# Patient Record
Sex: Female | Born: 1990 | Race: White | Hispanic: No | Smoking: Never smoker
Health system: Southern US, Community
[De-identification: ages and names within clinical notes are randomized; demographics above are authoritative.]

## PROBLEM LIST (undated history)

## (undated) DIAGNOSIS — T753XXA Motion sickness, initial encounter: Secondary | ICD-10-CM

## (undated) DIAGNOSIS — N83209 Unspecified ovarian cyst, unspecified side: Secondary | ICD-10-CM

## (undated) DIAGNOSIS — N946 Dysmenorrhea, unspecified: Secondary | ICD-10-CM

## (undated) DIAGNOSIS — F419 Anxiety disorder, unspecified: Secondary | ICD-10-CM

## (undated) DIAGNOSIS — F32A Depression, unspecified: Secondary | ICD-10-CM

## (undated) DIAGNOSIS — N63 Unspecified lump in unspecified breast: Secondary | ICD-10-CM

## (undated) DIAGNOSIS — F329 Major depressive disorder, single episode, unspecified: Secondary | ICD-10-CM

## (undated) DIAGNOSIS — O43129 Velamentous insertion of umbilical cord, unspecified trimester: Secondary | ICD-10-CM

## (undated) HISTORY — DX: Velamentous insertion of umbilical cord, unspecified trimester: O43.129

## (undated) HISTORY — DX: Unspecified ovarian cyst, unspecified side: N83.209

## (undated) HISTORY — DX: Unspecified lump in unspecified breast: N63.0

## (undated) HISTORY — DX: Anxiety disorder, unspecified: F41.9

## (undated) HISTORY — DX: Dysmenorrhea, unspecified: N94.6

## (undated) HISTORY — DX: Depression, unspecified: F32.A

---
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
## 2010-01-01 ENCOUNTER — Emergency Department: Payer: Self-pay | Admitting: Emergency Medicine

## 2010-05-02 DIAGNOSIS — N63 Unspecified lump in unspecified breast: Secondary | ICD-10-CM

## 2010-05-02 HISTORY — DX: Unspecified lump in unspecified breast: N63.0

## 2011-09-26 ENCOUNTER — Ambulatory Visit: Payer: Self-pay | Admitting: Family Medicine

## 2013-10-06 ENCOUNTER — Emergency Department: Payer: Self-pay | Admitting: Emergency Medicine

## 2013-10-06 LAB — CBC WITH DIFFERENTIAL/PLATELET
BASOS PCT: 0.2 %
Basophil #: 0 10*3/uL (ref 0.0–0.1)
EOS ABS: 0 10*3/uL (ref 0.0–0.7)
Eosinophil %: 0.4 %
HCT: 37.9 % (ref 35.0–47.0)
HGB: 13.1 g/dL (ref 12.0–16.0)
LYMPHS PCT: 17.7 %
Lymphocyte #: 1.9 10*3/uL (ref 1.0–3.6)
MCH: 32.3 pg (ref 26.0–34.0)
MCHC: 34.6 g/dL (ref 32.0–36.0)
MCV: 93 fL (ref 80–100)
Monocyte #: 0.7 x10 3/mm (ref 0.2–0.9)
Monocyte %: 7 %
NEUTROS PCT: 74.7 %
Neutrophil #: 7.9 10*3/uL — ABNORMAL HIGH (ref 1.4–6.5)
Platelet: 263 10*3/uL (ref 150–440)
RBC: 4.06 10*6/uL (ref 3.80–5.20)
RDW: 12.6 % (ref 11.5–14.5)
WBC: 10.6 10*3/uL (ref 3.6–11.0)

## 2013-10-06 LAB — HCG, QUANTITATIVE, PREGNANCY: Beta Hcg, Quant.: 14947 m[IU]/mL — ABNORMAL HIGH

## 2013-10-06 LAB — BASIC METABOLIC PANEL
ANION GAP: 7 (ref 7–16)
BUN: 9 mg/dL (ref 7–18)
CHLORIDE: 108 mmol/L — AB (ref 98–107)
CO2: 22 mmol/L (ref 21–32)
CREATININE: 0.66 mg/dL (ref 0.60–1.30)
Calcium, Total: 8.9 mg/dL (ref 8.5–10.1)
EGFR (African American): >60
EGFR (Non-African Amer.): >60
GLUCOSE: 56 mg/dL — AB (ref 65–99)
Osmolality: 270 (ref 275–301)
Potassium: 3.8 mmol/L (ref 3.5–5.1)
Sodium: 137 mmol/L (ref 136–145)

## 2013-10-06 LAB — URINALYSIS, COMPLETE
BACTERIA: NONE SEEN
Bilirubin,UR: NEGATIVE
GLUCOSE, UR: NEGATIVE mg/dL (ref 0–75)
Leukocyte Esterase: NEGATIVE
NITRITE: NEGATIVE
PH: 9 (ref 4.5–8.0)
Protein: NEGATIVE
RBC,UR: 5 /HPF (ref 0–5)
Specific Gravity: 1.003 (ref 1.003–1.030)
Squamous Epithelial: 3
WBC UR: 1 /HPF (ref 0–5)

## 2013-10-06 LAB — GC/CHLAMYDIA PROBE AMP

## 2013-10-06 LAB — WET PREP, GENITAL

## 2013-11-30 ENCOUNTER — Encounter: Payer: Self-pay | Admitting: Maternal & Fetal Medicine

## 2014-03-23 ENCOUNTER — Inpatient Hospital Stay: Payer: Self-pay | Admitting: Obstetrics and Gynecology

## 2014-03-23 LAB — CBC WITH DIFFERENTIAL/PLATELET
Basophil #: 0 10*3/uL (ref 0.0–0.1)
Basophil %: 0.2 %
Eosinophil #: 0 10*3/uL (ref 0.0–0.7)
Eosinophil %: 0.1 %
HCT: 37.9 % (ref 35.0–47.0)
HGB: 12.6 g/dL (ref 12.0–16.0)
Lymphocyte #: 1.8 10*3/uL (ref 1.0–3.6)
Lymphocyte %: 10.6 %
MCH: 31.1 pg (ref 26.0–34.0)
MCHC: 33.2 g/dL (ref 32.0–36.0)
MCV: 94 fL (ref 80–100)
Monocyte #: 0.8 x10 3/mm (ref 0.2–0.9)
Monocyte %: 4.8 %
Neutrophil #: 14 10*3/uL — ABNORMAL HIGH (ref 1.4–6.5)
Neutrophil %: 84.3 %
Platelet: 336 10*3/uL (ref 150–440)
RBC: 4.05 10*6/uL (ref 3.80–5.20)
RDW: 13.5 % (ref 11.5–14.5)
WBC: 16.6 10*3/uL — ABNORMAL HIGH (ref 3.6–11.0)

## 2014-03-23 LAB — GC/CHLAMYDIA PROBE AMP

## 2014-03-24 DIAGNOSIS — O43129 Velamentous insertion of umbilical cord, unspecified trimester: Secondary | ICD-10-CM

## 2014-03-25 LAB — HEMATOCRIT: HCT: 28.9 % — AB (ref 35.0–47.0)

## 2014-11-09 NOTE — H&P (Signed)
L&D Evaluation:  History Expanded:  HPI 24 yo G1 with EDD of 03/25/14 per 6 wk [place], presents at [redacted]w[redacted]d with c/o contractions and mucous-like discharge. Denies VB or decreased FM. PNC at [facility], early entry to care, Velamentous cord insertion -growth [place] EFW at 27 wks: 67%, at 31 wks: 42%. TDAP given 01/26/14. In[employer]aseq negative ([employer] employee).   Blood Type (Maternal) O positive   Group B Strep Results Maternal (Result >5wks must be treated as unknown) + urine culture, negative vaginal/rectal culture   Maternal HIV Negative   Maternal Syphilis Ab Nonreactive   Maternal Varicella Immune   Rubella Results (Maternal) immune   Patient's Medical History Anxiety - no meds in pregnancy   Patient's Surgical History none   Medications Pre Natal Vitamins   Allergies Amoxicillin   Social History none   Exam:  Vital Signs stable   General no apparent distress   Mental Status clear   Chest no increased work of breathing   Abdomen gravid, tender with contractions   Estimated Fetal Weight Average for gestational age   Pelvic 3/90/-1, changed from 1 cm on admission   Mebranes Intact, nitrizine negative   FHT normal rate with no decels, category 1 tracing   Ucx regular   Impression:  Impression active labor   Plan:  Plan antibiotics for GBBS prophylaxis   Comments Admission for labor - declines pain medication at this time.   EFW at 31 wks: 42%, 38 lb weight gain during pregnancy   Electronic Signatures: Midas Daughety, Rulon Sera (CNM)  (Signed 22-Sep-15 12:46)  Authored: L&D Evaluation   Last Updated: 22-Sep-15 12:46 by Ander Purpura (CNM)

## 2015-05-02 ENCOUNTER — Encounter: Payer: Self-pay | Admitting: Family Medicine

## 2015-05-02 ENCOUNTER — Other Ambulatory Visit (INDEPENDENT_AMBULATORY_CARE_PROVIDER_SITE_OTHER): Payer: 59

## 2015-05-02 ENCOUNTER — Ambulatory Visit (INDEPENDENT_AMBULATORY_CARE_PROVIDER_SITE_OTHER): Payer: 59 | Admitting: Family Medicine

## 2015-05-02 ENCOUNTER — Ambulatory Visit (INDEPENDENT_AMBULATORY_CARE_PROVIDER_SITE_OTHER)
Admission: RE | Admit: 2015-05-02 | Discharge: 2015-05-02 | Disposition: A | Discharge status: Home / Self Care | Payer: 59 | Source: Ambulatory Visit | Attending: Family Medicine | Admitting: Family Medicine

## 2015-05-02 VITALS — BP 120/82 | HR 70 | Ht 67.0 in | Wt 141.0 lb

## 2015-05-02 DIAGNOSIS — S76301A Unspecified injury of muscle, fascia and tendon of the posterior muscle group at thigh level, right thigh, initial encounter: Secondary | ICD-10-CM

## 2015-05-02 DIAGNOSIS — S76311A Strain of muscle, fascia and tendon of the posterior muscle group at thigh level, right thigh, initial encounter: Secondary | ICD-10-CM

## 2015-05-02 DIAGNOSIS — S76319A Strain of muscle, fascia and tendon of the posterior muscle group at thigh level, unspecified thigh, initial encounter: Secondary | ICD-10-CM | POA: Insufficient documentation

## 2015-05-02 MED ORDER — MELOXICAM 15 MG PO TABS: 15.0000 mg | ORAL_TABLET | Freq: Every day | ORAL | Status: DC

## 2015-05-02 NOTE — Progress Notes (Signed)
Pre visit review using our clinic review tool, if applicable. No additional management support is needed unless otherwise documented below in the visit note. 

## 2015-05-02 NOTE — Progress Notes (Signed)
[facility] [place], [address] [address] Phone: [phone] Subjective:    I'm seeing this patient by the request  of:  Lykins, MD  CC: right leg pain  DJS:[id number] Gabrielle Jackson is a 24 y.o. female coming in with complaint of right thigh pain.patient was seen 10 days ago in an urgent care facility and was diagnosed with a potential for a hamstring strain. Patient was to take prednisone, icing, and some mild exercises. Patient did not do any of these. Continues to have pain. Going on 2 weeks now. Does not remember any true injury. Rates the severity of pain as 5 out of 10. Seems to be more of a tightness. Unable to work out on a regular basis. Not affecting daily activities though. States that it feels tight at night but does not wake her up at night.denies any associated back pain.    No past medical history on file.  No past surgical history on file. Social History  Substance Use Topics  . Smoking status: Never Smoker   . Smokeless tobacco: None  . Alcohol Use: None   Not on File No family history on file.  Past medical history, social, surgical and family history all reviewed in electronic medical record.   Review of Systems: No headache, visual changes, nausea, vomiting, diarrhea, constipation, dizziness, abdominal pain, skin rash, fevers, chills, night sweats, weight loss, swollen lymph nodes, body aches, joint swelling, muscle aches, chest pain, shortness of breath, mood changes.   Objective Blood pressure 120/82, pulse 70, height 5\' 7"  (1.702 m), weight 141 lb (63.957 kg), SpO2 99 %.  General: No apparent distress alert and oriented x3 mood and affect normal, dressed appropriately.  HEENT: Pupils equal, extraocular movements intact  Respiratory: Patient's speak in full sentences and does not appear short of breath  Cardiovascular: No lower extremity edema, non tender, no erythema  Skin: Warm dry intact with no signs of  infection or rash on extremities or on axial skeleton.  Abdomen: Soft nontender  Neuro: Cranial nerves II through XII are intact, neurovascularly intact in all extremities with 2+ DTRs and 2+ pulses.  Lymph: No lymphadenopathy of posterior or anterior cervical chain or axillae bilaterally.  Gaitmild antalgic gait  MSK:  Non tender with full range of motion and good stability and symmetric strength and tone of shoulders, elbows, wrist, hip, and ankles bilaterally.  Knee:right Normal to inspection with no erythema or effusion or obvious bony abnormalities. Tender to palpation over the hamstring distal third. Significant tightness compared to the contralateral side.4-5 strength compared to the contralateral side. No pain over the ischial and mild pain over the lateral aspect of the knee. ROM full in flexion and extension and lower leg rotation. Ligaments with solid consistent endpoints including ACL, PCL, LCL, MCL. Negative Mcmurray's, Apley's, and Thessalonian tests. Non painful patellar compression. Patellar glide without crepitus. Patellar and quadriceps tendons unremarkable. Contralateral knee unremarkable  MSK US performed of: right leg This study was ordered, performed, and interpreted by Charlann Boxer D.O.  Knee: All structures visualized. Anteromedial, anterolateral, posteromedial, and posterolateral menisci unremarkable without tearing, fraying, effusion, or displacement. Patient distal third of the hamstrings does have what seems to be a tear mostly of the semi-tendinosis muscle. Hypoechoic changes still noted and increasing Doppler flow. Patient does have scar tissue formation noted. Seems to be healing appropriately. Patellar Tendon unremarkable on long and transverse views without effusion. No abnormality of prepatellar bursa. LCL and MCL unremarkable on  long and transverse views. No abnormality of origin of medial or lateral head of the gastrocnemius.  IMPRESSION:  Hamstring tear  with healing interval.  Procedure note [id number]; 15 minutes spent for Therapeutic exercises as stated in above notes.  This included exercises focusing on stretching, strengthening, with significant focus on eccentric aspects.  .pPatient given eccentric muscle exercises working on range of motion as well as some strengthening. Proper technique shown and discussed handout in great detail with ATC.  All questions were discussed and answered.      Impression and Recommendations:     This case required medical decision making of moderate complexity.

## 2015-05-02 NOTE — Assessment & Plan Note (Signed)
Patient will do more of a compression sleeve, work with Product/process development scientist today. We discussed icing and oral anti-inflammatories. Patient will try these interventions and come back and see me again in 2-3 weeks. X-rays ordered today to rule out any bony abnormality or avulsion fracture. Patient's continuing to have trouble and is imaging would be warranted but I think she will do very well.

## 2015-05-02 NOTE — Patient Instructions (Signed)
Meloxicam daily for 10 days then as needed pennsaid pinkie amount topically 2 times daily as needed.  Ice 20 minutes 2 times daily. Usually after activity and before bed. Compression sleeve to the thigh Exercises 3 times a week.  Avoid any jumping, running or high impact exercises for now See me again in 2-3 weeks.

## 2015-05-04 ENCOUNTER — Telehealth: Payer: Self-pay | Admitting: Family Medicine

## 2015-05-04 NOTE — Telephone Encounter (Signed)
Discussed results with pt.

## 2015-05-04 NOTE — Telephone Encounter (Signed)
Left message to call back  

## 2015-05-04 NOTE — Telephone Encounter (Signed)
Is requesting call back with x ray results

## 2015-05-23 ENCOUNTER — Ambulatory Visit: Payer: 59 | Admitting: Family Medicine

## 2015-07-05 ENCOUNTER — Encounter: Payer: Self-pay | Admitting: *Deleted

## 2015-07-05 ENCOUNTER — Emergency Department: Payer: 59

## 2015-07-05 ENCOUNTER — Emergency Department
Admission: EM | Admit: 2015-07-05 | Discharge: 2015-07-05 | Disposition: A | Discharge status: Home / Self Care | Payer: 59 | Attending: Emergency Medicine | Admitting: Emergency Medicine

## 2015-07-05 DIAGNOSIS — Z880 Allergy status to penicillin: Secondary | ICD-10-CM | POA: Insufficient documentation

## 2015-07-05 DIAGNOSIS — Z3202 Encounter for pregnancy test, result negative: Secondary | ICD-10-CM | POA: Insufficient documentation

## 2015-07-05 DIAGNOSIS — R1013 Epigastric pain: Secondary | ICD-10-CM | POA: Diagnosis present

## 2015-07-05 DIAGNOSIS — Z79899 Other long term (current) drug therapy: Secondary | ICD-10-CM | POA: Diagnosis not present

## 2015-07-05 DIAGNOSIS — K2970 Gastritis, unspecified, without bleeding: Secondary | ICD-10-CM | POA: Insufficient documentation

## 2015-07-05 DIAGNOSIS — R1011 Right upper quadrant pain: Secondary | ICD-10-CM

## 2015-07-05 DIAGNOSIS — R1012 Left upper quadrant pain: Secondary | ICD-10-CM

## 2015-07-05 LAB — URINALYSIS COMPLETE WITH MICROSCOPIC (ARMC ONLY)
BACTERIA UA: NONE SEEN
BILIRUBIN URINE: NEGATIVE
Glucose, UA: NEGATIVE mg/dL
Ketones, ur: NEGATIVE mg/dL
Leukocytes, UA: NEGATIVE
NITRITE: NEGATIVE
PH: 6 (ref 5.0–8.0)
Protein, ur: NEGATIVE mg/dL
SPECIFIC GRAVITY, URINE: 1.019 (ref 1.005–1.030)

## 2015-07-05 LAB — COMPREHENSIVE METABOLIC PANEL
ALT: 19 U/L (ref 14–54)
AST: 19 U/L (ref 15–41)
Albumin: 4.4 g/dL (ref 3.5–5.0)
Alkaline Phosphatase: 76 U/L (ref 38–126)
Anion gap: 8 (ref 5–15)
BUN: 20 mg/dL (ref 6–20)
CHLORIDE: 108 mmol/L (ref 101–111)
CO2: 25 mmol/L (ref 22–32)
Calcium: 9.4 mg/dL (ref 8.9–10.3)
Creatinine, Ser: 0.78 mg/dL (ref 0.44–1.00)
Glucose, Bld: 85 mg/dL (ref 65–99)
POTASSIUM: 3.6 mmol/L (ref 3.5–5.1)
Sodium: 141 mmol/L (ref 135–145)
TOTAL PROTEIN: 7.2 g/dL (ref 6.5–8.1)
Total Bilirubin: 0.8 mg/dL (ref 0.3–1.2)

## 2015-07-05 LAB — LIPASE, BLOOD: Lipase: 36 U/L (ref 11–51)

## 2015-07-05 LAB — CBC
HEMATOCRIT: 41.5 % (ref 35.0–47.0)
Hemoglobin: 14.3 g/dL (ref 12.0–16.0)
MCH: 30.7 pg (ref 26.0–34.0)
MCHC: 34.4 g/dL (ref 32.0–36.0)
MCV: 89.2 fL (ref 80.0–100.0)
PLATELETS: 279 10*3/uL (ref 150–440)
RBC: 4.65 MIL/uL (ref 3.80–5.20)
RDW: 12.5 % (ref 11.5–14.5)
WBC: 6.5 10*3/uL (ref 3.6–11.0)

## 2015-07-05 LAB — POCT PREGNANCY, URINE: Preg Test, Ur: NEGATIVE

## 2015-07-05 MED ORDER — RANITIDINE HCL 150 MG PO CAPS: 150.0000 mg | ORAL_CAPSULE | Freq: Two times a day (BID) | ORAL | Status: DC

## 2015-07-05 MED ORDER — ONDANSETRON HCL 4 MG/2ML IJ SOLN
4.0000 mg | Freq: Once | INTRAMUSCULAR | Status: AC
  Administered 2015-07-05: 4 mg via INTRAVENOUS

## 2015-07-05 MED ORDER — METOCLOPRAMIDE HCL 10 MG PO TABS: 10.0000 mg | ORAL_TABLET | Freq: Three times a day (TID) | ORAL | Status: DC

## 2015-07-05 MED ORDER — DIPHENHYDRAMINE HCL 50 MG/ML IJ SOLN
25.0000 mg | Freq: Once | INTRAMUSCULAR | Status: AC
  Administered 2015-07-05: 25 mg via INTRAVENOUS
  Filled 2015-07-05: qty 1

## 2015-07-05 MED ORDER — ONDANSETRON HCL 4 MG/2ML IJ SOLN
INTRAMUSCULAR | Status: AC
  Administered 2015-07-05: 4 mg via INTRAVENOUS
  Filled 2015-07-05: qty 2

## 2015-07-05 MED ORDER — GI COCKTAIL ~~LOC~~
30.0000 mL | ORAL | Status: AC
  Administered 2015-07-05: 30 mL via ORAL
  Filled 2015-07-05: qty 30

## 2015-07-05 MED ORDER — METOCLOPRAMIDE HCL 5 MG/ML IJ SOLN
10.0000 mg | Freq: Once | INTRAMUSCULAR | Status: AC
  Administered 2015-07-05: 10 mg via INTRAVENOUS
  Filled 2015-07-05: qty 2

## 2015-07-05 MED ORDER — DIPHENHYDRAMINE HCL 25 MG PO CAPS: 50.0000 mg | ORAL_CAPSULE | Freq: Four times a day (QID) | ORAL | Status: DC | PRN

## 2015-07-05 MED ORDER — FAMOTIDINE 20 MG PO TABS
40.0000 mg | ORAL_TABLET | Freq: Once | ORAL | Status: AC
  Administered 2015-07-05: 40 mg via ORAL
  Filled 2015-07-05: qty 2

## 2015-07-05 MED ORDER — SUCRALFATE 1 G PO TABS: 1.0000 g | ORAL_TABLET | Freq: Four times a day (QID) | ORAL | Status: DC

## 2015-07-05 NOTE — ED Notes (Signed)
Pt reports epigastric worse after eating with nausea , pt reports pain for 1 week at night after dinner

## 2015-07-05 NOTE — ED Provider Notes (Signed)
[facility] Provider Note  ____________________________________________  Time seen: 9:00 AM  I have reviewed the triage vital signs and the nursing notes.   HISTORY  Chief Complaint Abdominal Pain    HPI Gabrielle Jackson is a 25 y.o. female who complains of epigastric pain with nausea that gets worse after eating. His mom for the past week. No prior known medical history or surgeries. Denies any trauma. No vomiting or diarrhea. Pain radiates to her right shoulder. No urinary symptoms.     History reviewed. No pertinent past medical history.   Patient Active Problem List   Diagnosis Date Noted  . Partial hamstring tear 05/02/2015     History reviewed. No pertinent past surgical history.   Current Outpatient Rx  Name  Route  Sig  Dispense  Refill  . diphenhydrAMINE (BENADRYL) 25 mg capsule   Oral   Take 2 capsules (50 mg total) by mouth every 6 (six) hours as needed.   60 capsule   0   . meloxicam (MOBIC) 15 MG tablet   Oral   Take 1 tablet (15 mg total) by mouth daily. Patient not taking: Reported on 07/05/2015   30 tablet   0   . metoCLOPramide (REGLAN) 10 MG tablet   Oral   Take 1 tablet (10 mg total) by mouth 4 (four) times daily -  before meals and at bedtime.   60 tablet   0   . ranitidine (ZANTAC) 150 MG capsule   Oral   Take 1 capsule (150 mg total) by mouth 2 (two) times daily.   28 capsule   0   . sucralfate (CARAFATE) 1 g tablet   Oral   Take 1 tablet (1 g total) by mouth 4 (four) times daily.   120 tablet   1      Allergies Penicillins   No family history on file.  Social History Social History  Substance Use Topics  . Smoking status: Never Smoker   . Smokeless tobacco: None  . Alcohol Use: No    Review of Systems  Constitutional:   No fever or chills. No weight changes Eyes:   No blurry vision or double vision.  ENT:   No sore throat. Cardiovascular:   No chest  pain. Respiratory:   No dyspnea or cough. Gastrointestinal:   Positive abdominal pain as above without vomiting or diarrhea.  No BRBPR or melena. Genitourinary:   Negative for dysuria, urinary retention, bloody urine, or difficulty urinating. Musculoskeletal:   Negative for back pain. No joint swelling or pain. Skin:   Negative for rash. Neurological:   Negative for headaches, focal weakness or numbness. Psychiatric:  No anxiety or depression.   Endocrine:  No hot/cold intolerance, changes in energy, or sleep difficulty.  10-point ROS otherwise negative.  ____________________________________________   PHYSICAL EXAM:  VITAL SIGNS: ED Triage Vitals  Enc Vitals Group     BP 07/05/15 0830 121/65 mmHg     Pulse Rate 07/05/15 0830 87     Resp 07/05/15 0830 20     Temp 07/05/15 0830 97.8 F (36.6 C)     Temp Source 07/05/15 0830 Oral     SpO2 07/05/15 0830 100 %     Weight 07/05/15 0830 135 lb (61.236 kg)     Height 07/05/15 0830 5\' 4"  (1.626 m)     Head Cir --      Peak Flow --      Pain Score 07/05/15 0831 7  Pain Loc --      Pain Edu? --      Excl. in [place]? --     Vital signs reviewed, nursing assessments reviewed.   Constitutional:   Alert and oriented. Mild distress due to pain Eyes:   No scleral icterus. No conjunctival pallor. PERRL. EOMI ENT   Head:   Normocephalic and atraumatic.   Nose:   No congestion/rhinnorhea. No septal hematoma   Mouth/Throat:   MMM, no pharyngeal erythema. No peritonsillar mass. No uvula shift.   Neck:   No stridor. No SubQ emphysema. No meningismus. Hematological/Lymphatic/Immunilogical:   No cervical lymphadenopathy. Cardiovascular:   RRR. Normal and symmetric distal pulses are present in all extremities. No murmurs, rubs, or gallops. Respiratory:   Normal respiratory effort without tachypnea nor retractions. Breath sounds are clear and equal bilaterally. No wheezes/rales/rhonchi. Gastrointestinal:   Soft with bilateral  upper quadrant tenderness. No distention. Positive right CVA tenderness..  No rebound, rigidity, or guarding. Genitourinary:   deferred Musculoskeletal:   Nontender with normal range of motion in all extremities. No joint effusions.  No lower extremity tenderness.  No edema. Neurologic:   Normal speech and language.  CN 2-10 normal. Motor grossly intact. No pronator drift.  Normal gait. No gross focal neurologic deficits are appreciated.  Skin:    Skin is warm, dry and intact. No rash noted.  No petechiae, purpura, or bullae. Psychiatric:   Mood and affect are normal. Speech and behavior are normal. Patient exhibits appropriate insight and judgment.  ____________________________________________    LABS (pertinent positives/negatives) (all labs ordered are listed, but only abnormal results are displayed) Labs Reviewed  URINALYSIS COMPLETEWITH MICROSCOPIC ([facility]) - Abnormal; Notable for the following:    Color, Urine YELLOW (*)    APPearance CLEAR (*)    Hgb urine dipstick 1+ (*)    Squamous Epithelial / LPF 0-5 (*)    All other components within normal limits  LIPASE, BLOOD  COMPREHENSIVE METABOLIC PANEL  CBC  POC URINE PREG, ED  POCT PREGNANCY, URINE   ____________________________________________   EKG    ____________________________________________    RADIOLOGY  Ultrasound right upper quadrant unremarkable  ____________________________________________   PROCEDURES   ____________________________________________   INITIAL IMPRESSION / ASSESSMENT AND PLAN / ED COURSE  Pertinent labs & imaging results that were available during my care of the patient were reviewed by me and considered in my medical decision making (see chart for details).  Patient presents with right upper quadrant pain. Exam is somewhat nonfocal but suggestive of possible biliary pathology. Vital signs are normal. She is not in distress and overall well appearing and calm. We'll check labs  and ultrasound.  ----------------------------------------- 12:02 PM on 07/05/2015 -----------------------------------------  Workup completely normal. No concerns at all. Patient given GI cocktail and antacid regimen with relief of symptoms. We'll continue on Carafate Zantac Reglan and Benadryl and discharged home to follow up with primary care.     ____________________________________________   FINAL CLINICAL IMPRESSION(S) / ED DIAGNOSES  Final diagnoses:  Acute bilateral upper abdominal pain  Gastritis      Carrie Mew, MD 07/05/15 1203

## 2015-07-05 NOTE — ED Notes (Signed)
Patient transported to Ultrasound 

## 2015-07-05 NOTE — Discharge Instructions (Signed)
Abdominal Pain, Adult °Many things can cause abdominal pain. Usually, abdominal pain is not caused by a disease and will improve without treatment. It can often be observed and treated at home. Your health care provider will do a physical exam and possibly order blood tests and X-rays to help determine the seriousness of your pain. However, in many cases, more time must pass before a clear cause of the pain can be found. Before that point, your health care provider may not know if you need more testing or further treatment. °HOME CARE INSTRUCTIONS °Monitor your abdominal pain for any changes. The following actions may help to alleviate any discomfort you are experiencing: °· Only take over-the-counter or prescription medicines as directed by your health care provider. °· Do not take laxatives unless directed to do so by your health care provider. °· Try a clear liquid diet (broth, tea, or water) as directed by your health care provider. Slowly move to a bland diet as tolerated. °SEEK MEDICAL CARE IF: °· You have unexplained abdominal pain. °· You have abdominal pain associated with nausea or diarrhea. °· You have pain when you urinate or have a bowel movement. °· You experience abdominal pain that wakes you in the night. °· You have abdominal pain that is worsened or improved by eating food. °· You have abdominal pain that is worsened with eating fatty foods. °· You have a fever. °SEEK IMMEDIATE MEDICAL CARE IF: °· Your pain does not go away within 2 hours. °· You keep throwing up (vomiting). °· Your pain is felt only in portions of the abdomen, such as the right side or the left lower portion of the abdomen. °· You pass bloody or black tarry stools. °MAKE SURE YOU: °· Understand these instructions. °· Will watch your condition. °· Will get help right away if you are not doing well or get worse. °  °This information is not intended to replace advice given to you by your health care provider. Make sure you discuss  any questions you have with your health care provider. °  °Document Released: 03/28/2005 Document Revised: 03/09/2015 Document Reviewed: 02/25/2013 °Elsevier Interactive Patient Education ©2016 [employer] ° °Gastritis, Adult °Gastritis is soreness and swelling (inflammation) of the lining of the stomach. Gastritis can develop as a sudden onset (acute) or long-term (chronic) condition. If gastritis is not treated, it can lead to stomach bleeding and ulcers. °CAUSES  °Gastritis occurs when the stomach lining is weak or damaged. Digestive juices from the stomach then inflame the weakened stomach lining. The stomach lining may be weak or damaged due to viral or bacterial infections. One common bacterial infection is the Helicobacter pylori infection. Gastritis can also result from excessive alcohol consumption, taking certain medicines, or having too much acid in the stomach.  °SYMPTOMS  °In some cases, there are no symptoms. When symptoms are present, they may include: °· Pain or a burning sensation in the upper abdomen. °· Nausea. °· Vomiting. °· An uncomfortable feeling of fullness after eating. °DIAGNOSIS  °Your caregiver may suspect you have gastritis based on your symptoms and a physical exam. To determine the cause of your gastritis, your caregiver may perform the following: °· Blood or stool tests to check for the H pylori bacterium. °· Gastroscopy. A thin, flexible tube (endoscope) is passed down the esophagus and into the stomach. The endoscope has a light and camera on the end. Your caregiver uses the endoscope to view the inside of the stomach. °· Taking a tissue sample (  biopsy) from the stomach to examine under a microscope. °TREATMENT  °Depending on the cause of your gastritis, medicines may be prescribed. If you have a bacterial infection, such as an H pylori infection, antibiotics may be given. If your gastritis is caused by too much acid in the stomach, H2 blockers or antacids may be given. Your  caregiver may recommend that you stop taking aspirin, ibuprofen, or other nonsteroidal anti-inflammatory drugs (NSAIDs). °HOME CARE INSTRUCTIONS °· Only take over-the-counter or prescription medicines as directed by your caregiver. °· If you were given antibiotic medicines, take them as directed. Finish them even if you start to feel better. °· Drink enough fluids to keep your urine clear or pale yellow. °· Avoid foods and drinks that make your symptoms worse, such as: °¨ Caffeine or alcoholic drinks. °¨ Chocolate. °¨ Peppermint or mint flavorings. °¨ Garlic and onions. °¨ Spicy foods. °¨ Citrus fruits, such as oranges, lemons, or limes. °¨ Tomato-based foods such as sauce, chili, salsa, and pizza. °¨ Fried and fatty foods. °· Eat small, frequent meals instead of large meals. °SEEK IMMEDIATE MEDICAL CARE IF:  °· You have black or dark red stools. °· You vomit blood or material that looks like coffee grounds. °· You are unable to keep fluids down. °· Your abdominal pain gets worse. °· You have a fever. °· You do not feel better after 1 week. °· You have any other questions or concerns. °MAKE SURE YOU: °· Understand these instructions. °· Will watch your condition. °· Will get help right away if you are not doing well or get worse. °  °This information is not intended to replace advice given to you by your health care provider. Make sure you discuss any questions you have with your health care provider. °  °Document Released: 06/12/2001 Document Revised: 12/18/2011 Document Reviewed: 08/01/2011 °Elsevier Interactive Patient Education ©2016 [employer]. ° °

## 2015-07-07 LAB — HM PAP SMEAR

## 2015-07-20 ENCOUNTER — Ambulatory Visit: Payer: 59 | Admitting: Family Medicine

## 2015-07-28 ENCOUNTER — Emergency Department: Payer: 59

## 2015-07-28 ENCOUNTER — Encounter: Payer: Self-pay | Admitting: Emergency Medicine

## 2015-07-28 ENCOUNTER — Emergency Department
Admission: EM | Admit: 2015-07-28 | Discharge: 2015-07-28 | Disposition: A | Discharge status: Home / Self Care | Payer: 59 | Attending: Emergency Medicine | Admitting: Emergency Medicine

## 2015-07-28 ENCOUNTER — Emergency Department
Admission: EM | Admit: 2015-07-28 | Discharge: 2015-07-28 | Disposition: A | Discharge status: Home / Self Care | Payer: 59 | Source: Home / Self Care

## 2015-07-28 DIAGNOSIS — R102 Pelvic and perineal pain: Secondary | ICD-10-CM | POA: Insufficient documentation

## 2015-07-28 DIAGNOSIS — N83201 Unspecified ovarian cyst, right side: Secondary | ICD-10-CM | POA: Diagnosis not present

## 2015-07-28 DIAGNOSIS — R1031 Right lower quadrant pain: Secondary | ICD-10-CM | POA: Insufficient documentation

## 2015-07-28 DIAGNOSIS — Z3202 Encounter for pregnancy test, result negative: Secondary | ICD-10-CM

## 2015-07-28 DIAGNOSIS — Z880 Allergy status to penicillin: Secondary | ICD-10-CM | POA: Insufficient documentation

## 2015-07-28 DIAGNOSIS — Z79899 Other long term (current) drug therapy: Secondary | ICD-10-CM | POA: Diagnosis not present

## 2015-07-28 LAB — CBC WITH DIFFERENTIAL/PLATELET
Basophils Absolute: 0 10*3/uL (ref 0–0.1)
Basophils Relative: 0 %
Eosinophils Absolute: 0.1 10*3/uL (ref 0–0.7)
Eosinophils Relative: 1 %
HEMATOCRIT: 43.4 % (ref 35.0–47.0)
HEMOGLOBIN: 14.9 g/dL (ref 12.0–16.0)
LYMPHS ABS: 2.6 10*3/uL (ref 1.0–3.6)
LYMPHS PCT: 32 %
MCH: 31.4 pg (ref 26.0–34.0)
MCHC: 34.2 g/dL (ref 32.0–36.0)
MCV: 91.6 fL (ref 80.0–100.0)
MONO ABS: 0.7 10*3/uL (ref 0.2–0.9)
MONOS PCT: 8 %
NEUTROS ABS: 4.6 10*3/uL (ref 1.4–6.5)
NEUTROS PCT: 59 %
Platelets: 254 10*3/uL (ref 150–440)
RBC: 4.74 MIL/uL (ref 3.80–5.20)
RDW: 12.4 % (ref 11.5–14.5)
WBC: 7.9 10*3/uL (ref 3.6–11.0)

## 2015-07-28 LAB — COMPREHENSIVE METABOLIC PANEL
ALBUMIN: 4.4 g/dL (ref 3.5–5.0)
ALK PHOS: 82 U/L (ref 38–126)
ALT: 21 U/L (ref 14–54)
ANION GAP: 5 (ref 5–15)
AST: 22 U/L (ref 15–41)
BUN: 16 mg/dL (ref 6–20)
CHLORIDE: 109 mmol/L (ref 101–111)
CO2: 26 mmol/L (ref 22–32)
Calcium: 9.1 mg/dL (ref 8.9–10.3)
Creatinine, Ser: 0.73 mg/dL (ref 0.44–1.00)
GFR calc Af Amer: >60 mL/min (ref >60)
GFR calc non Af Amer: >60 mL/min (ref >60)
GLUCOSE: 79 mg/dL (ref 65–99)
POTASSIUM: 3.8 mmol/L (ref 3.5–5.1)
SODIUM: 140 mmol/L (ref 135–145)
Total Bilirubin: 1 mg/dL (ref 0.3–1.2)
Total Protein: 7.8 g/dL (ref 6.5–8.1)

## 2015-07-28 LAB — CHLAMYDIA/NGC RT PCR (ARMC ONLY)
CHLAMYDIA TR: NOT DETECTED
N gonorrhoeae: NOT DETECTED

## 2015-07-28 LAB — URINALYSIS COMPLETE WITH MICROSCOPIC (ARMC ONLY)
BACTERIA UA: NONE SEEN
Bilirubin Urine: NEGATIVE
Glucose, UA: NEGATIVE mg/dL
Hgb urine dipstick: NEGATIVE
KETONES UR: NEGATIVE mg/dL
Leukocytes, UA: NEGATIVE
Nitrite: NEGATIVE
PH: 7 (ref 5.0–8.0)
PROTEIN: NEGATIVE mg/dL
SPECIFIC GRAVITY, URINE: 1.023 (ref 1.005–1.030)

## 2015-07-28 LAB — WET PREP, GENITAL
CLUE CELLS WET PREP: NONE SEEN
Sperm: NONE SEEN
TRICH WET PREP: NONE SEEN
Yeast Wet Prep HPF POC: NONE SEEN

## 2015-07-28 LAB — POCT PREGNANCY, URINE: PREG TEST UR: NEGATIVE

## 2015-07-28 LAB — LIPASE, BLOOD: Lipase: 37 U/L (ref 11–51)

## 2015-07-28 MED ORDER — ONDANSETRON HCL 4 MG/2ML IJ SOLN
INTRAMUSCULAR | Status: AC
  Filled 2015-07-28: qty 2

## 2015-07-28 MED ORDER — SODIUM CHLORIDE 0.9 % IV BOLUS (SEPSIS)
1000.0000 mL | Freq: Once | INTRAVENOUS | Status: AC
  Administered 2015-07-28: 1000 mL via INTRAVENOUS

## 2015-07-28 MED ORDER — OXYCODONE-ACETAMINOPHEN 5-325 MG PO TABS: 1.0000 | ORAL_TABLET | ORAL | Status: DC | PRN

## 2015-07-28 MED ORDER — IOHEXOL 300 MG/ML  SOLN
100.0000 mL | Freq: Once | INTRAMUSCULAR | Status: AC | PRN
  Administered 2015-07-28: 100 mL via INTRAVENOUS
  Filled 2015-07-28: qty 100

## 2015-07-28 MED ORDER — ONDANSETRON HCL 4 MG/2ML IJ SOLN
4.0000 mg | Freq: Once | INTRAMUSCULAR | Status: AC
  Administered 2015-07-28: 4 mg via INTRAVENOUS

## 2015-07-28 MED ORDER — MORPHINE SULFATE (PF) 4 MG/ML IV SOLN: 4.0000 mg | Freq: Once | INTRAVENOUS | Status: DC

## 2015-07-28 MED ORDER — IOHEXOL 240 MG/ML SOLN
25.0000 mL | Freq: Once | INTRAMUSCULAR | Status: AC | PRN
  Administered 2015-07-28: 25 mL via ORAL
  Filled 2015-07-28: qty 25

## 2015-07-28 NOTE — H&P (Signed)
Gabrielle Jackson is an 25 y.o. female.   Chief Complaint: RLQ pain HPI: 25 yr old female who initially felt like she was having a UTI.  Patient states she had pain in the pubis yesterday, pain and burning with urination and some frequency.  Patient states the pain then intensified and was more in the right side. Patient states that the pain is sharp, like a stabbing knife and 8 of 10 in pain.  She states that it did hurt as she went over bumps in the road.  She states it hurts worse when she moves and has not tried anything prior to coming to the ED.  Patient states that she has been nauseated but no vomiting. She also endorses feeling constipated for the past two days.   Patient denies fever, chills, diarrhea, melena, hematuria.    PMHx: Vaginal delivery 2 yrs prior, GERD  PSHx: NO past surgeries  FamHx:  Stroke in Grandfather in 60s, no hx of cancer, diabetes, heart disease or inflammatory bowel diseases  Social History: She does not smoke or use smokeless tobacco, drinks Alcohol very rarely and does not use any illicit drugs.   Allergies:  Allergies  Allergen Reactions  . Penicillins Rash     (Not in a hospital admission)  Results for orders placed or performed during the hospital encounter of 07/28/15 (from the past 48 hour(s))  Wet prep, genital     Status: Abnormal   Collection Time: 07/28/15  3:10 PM  Result Value Ref Range   Yeast Wet Prep HPF POC NONE SEEN NONE SEEN   Trich, Wet Prep NONE SEEN NONE SEEN   Clue Cells Wet Prep HPF POC NONE SEEN NONE SEEN   WBC, Wet Prep HPF POC MODERATE (A) NONE SEEN   Sperm NONE SEEN   Chlamydia/NGC rt PCR ([facility] only)     Status: None   Collection Time: 07/28/15  3:10 PM  Result Value Ref Range   Specimen source GC/Chlam ENDOCERVICAL    Chlamydia Tr NOT DETECTED NOT DETECTED   N gonorrhoeae NOT DETECTED NOT DETECTED    Comment: (NOTE) 100  This methodology has not been evaluated in pregnant women or in 200  patients with a history of  hysterectomy. 300 400  This methodology will not be performed on patients less than 77  years of age.    US Transvaginal Non-ob  07/28/2015  CLINICAL DATA:  25 year old presenting with acute onset of right-sided pelvic pain yesterday. LMP 07/03/2015. EXAM: TRANSABDOMINAL AND TRANSVAGINAL ULTRASOUND OF PELVIS DOPPLER ULTRASOUND OF OVARIES TECHNIQUE: Both transabdominal and transvaginal ultrasound examinations of the pelvis were performed. Transabdominal technique was performed for global imaging of the pelvis including uterus, ovaries, adnexal regions, and pelvic cul-de-sac. It was necessary to proceed with endovaginal exam following the transabdominal exam to visualize the endometrium and ovaries as the bladder was incompletely distended. Color and duplex Doppler ultrasound was utilized to evaluate blood flow to the ovaries. COMPARISON:  CT pelvis performed earlier today. No prior pelvic ultrasound except for OB ultrasounds obtained during her previous pregnancy in 2015. FINDINGS: Uterus Measurements: Approximately 7.6 x 4.5 x 5.6 cm. Homogeneous echotexture without focal fibroid or other myometrial abnormality. Normal-appearing uterine cervix. Endometrium Thickness: Approximately 12 mm. Normal appearance without evidence of endometrial fluid or mass. Right ovary Measurements: Approximately 3.0 x 2.0 x 3.0 cm. Small follicular cysts. Collapsing follicular cyst with peripheral color Doppler flow, corresponding to the finding on CT earlier today. No dominant cyst or solid mass. Normal color  Doppler flow within the ovary. Left ovary Measurements: Approximately 2.9 x 1.4 x 1.6 cm. Small follicular cysts. No dominant cyst or solid mass. Normal color Doppler flow within the ovary. Pulsed Doppler evaluation of both ovaries demonstrates normal low-resistance arterial and venous waveforms. Other findings Trace, physiologic free fluid in the cul-de-sac. IMPRESSION: Normal examination. Mild endometrial thickening is  related to the late proliferative/early secretory phase of menses. Electronically Signed   By: Evangeline Dakin M.D.   On: 07/28/2015 16:49   US Pelvis Complete  07/28/2015  CLINICAL DATA:  25 year old presenting with acute onset of right-sided pelvic pain yesterday. LMP 07/03/2015. EXAM: TRANSABDOMINAL AND TRANSVAGINAL ULTRASOUND OF PELVIS DOPPLER ULTRASOUND OF OVARIES TECHNIQUE: Both transabdominal and transvaginal ultrasound examinations of the pelvis were performed. Transabdominal technique was performed for global imaging of the pelvis including uterus, ovaries, adnexal regions, and pelvic cul-de-sac. It was necessary to proceed with endovaginal exam following the transabdominal exam to visualize the endometrium and ovaries as the bladder was incompletely distended. Color and duplex Doppler ultrasound was utilized to evaluate blood flow to the ovaries. COMPARISON:  CT pelvis performed earlier today. No prior pelvic ultrasound except for OB ultrasounds obtained during her previous pregnancy in 2015. FINDINGS: Uterus Measurements: Approximately 7.6 x 4.5 x 5.6 cm. Homogeneous echotexture without focal fibroid or other myometrial abnormality. Normal-appearing uterine cervix. Endometrium Thickness: Approximately 12 mm. Normal appearance without evidence of endometrial fluid or mass. Right ovary Measurements: Approximately 3.0 x 2.0 x 3.0 cm. Small follicular cysts. Collapsing follicular cyst with peripheral color Doppler flow, corresponding to the finding on CT earlier today. No dominant cyst or solid mass. Normal color Doppler flow within the ovary. Left ovary Measurements: Approximately 2.9 x 1.4 x 1.6 cm. Small follicular cysts. No dominant cyst or solid mass. Normal color Doppler flow within the ovary. Pulsed Doppler evaluation of both ovaries demonstrates normal low-resistance arterial and venous waveforms. Other findings Trace, physiologic free fluid in the cul-de-sac. IMPRESSION: Normal examination. Mild  endometrial thickening is related to the late proliferative/early secretory phase of menses. Electronically Signed   By: Evangeline Dakin M.D.   On: 07/28/2015 16:49   Ct Abdomen Pelvis W Contrast  07/28/2015  CLINICAL DATA:  Right lower quadrant abdominal pain for 1 day. EXAM: CT ABDOMEN AND PELVIS WITH CONTRAST TECHNIQUE: Multidetector CT imaging of the abdomen and pelvis was performed using the standard protocol following bolus administration of intravenous contrast. CONTRAST:  167mL OMNIPAQUE IOHEXOL 300 MG/ML  SOLN COMPARISON:  None. FINDINGS: Lower chest:  No acute findings. Hepatobiliary: No masses or other significant abnormality. Pancreas: No mass, inflammatory changes, or other significant abnormality. Spleen: Within normal limits in size and appearance. Adrenals/Urinary Tract: No masses identified. No evidence of hydronephrosis. Stomach/Bowel: Large and small bowel are normal in caliber. Stomach appears normal. Fairly large amount of stool within the rectosigmoid colon. Appendix is not seen on the axial images due to its position between cecum and overlying small bowel loops. Appendix is partially visible on coronal reconstructed images, measuring up to 6-7 mm diameter which is upper normal (measured on series 6, image 49). There appears to be trace free fluid adjacent to the appendix, underlying the cecum. Vascular/Lymphatic: Scattered small and moderate-sized lymph nodes are seen within the right lower quadrant mesentery. Additional small lymph nodes are seen within the central/left abdominal mesentery. No vascular abnormality appreciated. Reproductive: Uterus appears normal. No mass or significant free fluid seen within either adnexal region. Other: No free intraperitoneal air. Musculoskeletal: No suspicious bone lesions identified.  Superficial soft tissues are unremarkable. IMPRESSION: 1. Appendix not well seen due to its position between the cecum and adjacent small bowel loops. It is partially  visualized on coronal reconstructed images, measuring up to 6-7 mm diameter which is upper normal. There does, however, appear to be trace free fluid adjacent to the appendix (underlying the cecum), also only seen on the coronal reconstructed images (series 6, images 50 through 53). Findings are not conclusive for an acute appendicitis. I cannot exclude a very early appendicitis. At minimum, recommend close clinical follow-up. 2. Scattered small and moderate-sized lymph nodes within the right lower quadrant mesentery, with additional small lymph nodes scattered within the central/left abdominal mesentery. This raises the possibility of a mild mesenteric adenitis. These results were called by telephone at the time of interpretation on 07/28/2015 at 4:01 pm to Dr. Larae Grooms , who verbally acknowledged these results. Electronically Signed   By: Franki Cabot M.D.   On: 07/28/2015 16:04   [place] Art/ven Flow Abd Pelv Doppler  07/28/2015  CLINICAL DATA:  25 year old presenting with acute onset of right-sided pelvic pain yesterday. LMP 07/03/2015. EXAM: TRANSABDOMINAL AND TRANSVAGINAL ULTRASOUND OF PELVIS DOPPLER ULTRASOUND OF OVARIES TECHNIQUE: Both transabdominal and transvaginal ultrasound examinations of the pelvis were performed. Transabdominal technique was performed for global imaging of the pelvis including uterus, ovaries, adnexal regions, and pelvic cul-de-sac. It was necessary to proceed with endovaginal exam following the transabdominal exam to visualize the endometrium and ovaries as the bladder was incompletely distended. Color and duplex Doppler ultrasound was utilized to evaluate blood flow to the ovaries. COMPARISON:  CT pelvis performed earlier today. No prior pelvic ultrasound except for OB ultrasounds obtained during her previous pregnancy in 2015. FINDINGS: Uterus Measurements: Approximately 7.6 x 4.5 x 5.6 cm. Homogeneous echotexture without focal fibroid or other myometrial abnormality.  Normal-appearing uterine cervix. Endometrium Thickness: Approximately 12 mm. Normal appearance without evidence of endometrial fluid or mass. Right ovary Measurements: Approximately 3.0 x 2.0 x 3.0 cm. Small follicular cysts. Collapsing follicular cyst with peripheral color Doppler flow, corresponding to the finding on CT earlier today. No dominant cyst or solid mass. Normal color Doppler flow within the ovary. Left ovary Measurements: Approximately 2.9 x 1.4 x 1.6 cm. Small follicular cysts. No dominant cyst or solid mass. Normal color Doppler flow within the ovary. Pulsed Doppler evaluation of both ovaries demonstrates normal low-resistance arterial and venous waveforms. Other findings Trace, physiologic free fluid in the cul-de-sac. IMPRESSION: Normal examination. Mild endometrial thickening is related to the late proliferative/early secretory phase of menses. Electronically Signed   By: Evangeline Dakin M.D.   On: 07/28/2015 16:49    Review of Systems  Constitutional: Positive for malaise/fatigue. Negative for fever, chills and weight loss.  HENT: Negative for congestion and sore throat.   Respiratory: Negative for cough, shortness of breath and wheezing.   Cardiovascular: Negative for chest pain and leg swelling.  Gastrointestinal: Positive for nausea and abdominal pain. Negative for heartburn, vomiting, diarrhea, constipation and blood in stool.  Genitourinary: Positive for dysuria, urgency, frequency and flank pain. Negative for hematuria.  Musculoskeletal: Negative for back pain and falls.  Skin: Negative for itching and rash.  Neurological: Negative for dizziness, loss of consciousness and weakness.  Psychiatric/Behavioral: Negative for depression. The patient is not nervous/anxious.   All other systems reviewed and are negative.   Blood pressure 124/86, pulse 89, temperature 98 F (36.7 C), temperature source Oral, resp. rate 18, height 5\' 6"  (1.676 m), weight 140 lb (63.504 kg),  last  menstrual period 07/03/2015, SpO2 99 %. Physical Exam  Vitals reviewed. Constitutional: She is oriented to person, place, and time. She appears well-developed and well-nourished. No distress.  HENT:  Head: Normocephalic and atraumatic.  Right Ear: External ear normal.  Left Ear: External ear normal.  Nose: Nose normal.  Mouth/Throat: Oropharynx is clear and moist. No oropharyngeal exudate.  Eyes: Conjunctivae and EOM are normal. Pupils are equal, round, and reactive to light. No scleral icterus.  Neck: Normal range of motion. Neck supple. No tracheal deviation present.  Cardiovascular: Normal rate, regular rhythm, normal heart sounds and intact distal pulses.  Exam reveals no gallop and no friction rub.   No murmur heard. Respiratory: Effort normal and breath sounds normal. No respiratory distress. She has no wheezes. She has no rales.  GI: Soft. Bowel sounds are normal. She exhibits no distension and no mass. There is tenderness. There is guarding. There is no rebound.  Tender in suprapubic area and just to the right of this, traditional McBurney's point, not as tender but causes pressure down lower, negative CVA tenderness, + Psoas and + Obturator but pain just with movement of the right leg at all  Musculoskeletal: Normal range of motion. She exhibits no edema or tenderness.  Neurological: She is alert and oriented to person, place, and time. No cranial nerve deficit.  Skin: Skin is warm and dry. No rash noted. No erythema. No pallor.  Psychiatric: She has a normal mood and affect. Her behavior is normal. Judgment and thought content normal.     Assessment/Plan 25 yr old female with right lower quadrant abdominal pain.  I have personally reviewed her past medical history, revealing otherwise healthy female without chronic issues.  I have personally reviewed her Laboratory values showing normal WBC, negative pregnancy test and normal UA.   I have personally reviewed her CT scan images,  showing the cecum without fluid or stranding, small bowel and ileocecal value visible but unable to completely visualize the appendix.  Also with right adnexal cyst measuring 1.7cm, with some fluid surrounding, sitting directly on the bladder but this is not mentioned on radiology read.  I also reviewed the ultrasound images showing small cysts on both ovaries, with a collapsed follicular cyst on the right ovary and some fluid in the cul de sac.  I also reviewed the radiology reads.  I discussed with the patient that given her clinical finds of no WBC, pain more in the pelvis, and a radiology studies showing right ovarian cyst but can't visualized the appendix well, that my suspicion for appendicitis is relatively low and would be more inclined to think it is from a recently ruptured follicular cyst.     I discussed that occasionally in young women appendicitis can be difficult to diagnose.  I did discuss with her that even with all the evidence otherwise, if her pain worsens, she begin vomiting, or starts to have fever or chills that she should return to be evaluated and make sure it is not appendicitis.  I offered her admission and observation overnight but patient would like to go home with po meds and will return if any symptoms worsen.    Lavona Mound Ludger Bones 07/28/2015, 6:39 PM

## 2015-07-28 NOTE — ED Notes (Signed)
Was here for same this am and left   Says really bad right lower abd pain started yesterday.

## 2015-07-28 NOTE — ED Notes (Signed)
Reports RLQ pain this am.  Denies n/v/d , urinary sx or any other sx.

## 2015-07-28 NOTE — Discharge Instructions (Signed)
Return to the emergency department immediately for any worsening or concerning symptoms, especially worsening abdominal pain, nausea vomiting and fever. Abdominal Pain, Adult Many things can cause abdominal pain. Usually, abdominal pain is not caused by a disease and will improve without treatment. It can often be observed and treated at home. Your health care provider will do a physical exam and possibly order blood tests and X-rays to help determine the seriousness of your pain. However, in many cases, more time must pass before a clear cause of the pain can be found. Before that point, your health care provider may not know if you need more testing or further treatment. HOME CARE INSTRUCTIONS Monitor your abdominal pain for any changes. The following actions may help to alleviate any discomfort you are experiencing:  Only take over-the-counter or prescription medicines as directed by your health care provider.  Do not take laxatives unless directed to do so by your health care provider.  Try a clear liquid diet (broth, tea, or water) as directed by your health care provider. Slowly move to a bland diet as tolerated. SEEK MEDICAL CARE IF:  You have unexplained abdominal pain.  You have abdominal pain associated with nausea or diarrhea.  You have pain when you urinate or have a bowel movement.  You experience abdominal pain that wakes you in the night.  You have abdominal pain that is worsened or improved by eating food.  You have abdominal pain that is worsened with eating fatty foods.  You have a fever. SEEK IMMEDIATE MEDICAL CARE IF:  Your pain does not go away within 2 hours.  You keep throwing up (vomiting).  Your pain is felt only in portions of the abdomen, such as the right side or the left lower portion of the abdomen.  You pass bloody or black tarry stools. MAKE SURE YOU:  Understand these instructions.  Will watch your condition.  Will get help right away if you  are not doing well or get worse.   This information is not intended to replace advice given to you by your health care provider. Make sure you discuss any questions you have with your health care provider.   Document Released: 03/28/2005 Document Revised: 03/09/2015 Document Reviewed: 02/25/2013 Elsevier Interactive Patient Education [employer].

## 2015-07-28 NOTE — ED Provider Notes (Addendum)
[facility] Emergency Department Provider Note  ____________________________________________  Time seen: Approximately 2:30 PM  I have reviewed the triage vital signs and the nursing notes.   HISTORY  Chief Complaint Abdominal Pain    HPI Gabrielle Jackson is a 25 y.o. female without any chronic medical conditions was presenting today with right lower quadrant abdominal pain. She says that the pain is an 8 out of 10 and sharp and constant and began yesterday. She denies any vaginal bleeding or discharge. Denies any pain with urination. Says that she is nauseous but has not vomited. Says she has not moved her bowels in 3 days but is passing gas. Describing the pain is radiating through to her back. Denies any history of ovarian cysts. Does have a history of reflux.No history of kidney stones.   History reviewed. No pertinent past medical history.  Patient Active Problem List   Diagnosis Date Noted  . Partial hamstring tear 05/02/2015    No past surgical history on file.  Current Outpatient Rx  Name  Route  Sig  Dispense  Refill  . diphenhydrAMINE (BENADRYL) 25 mg capsule   Oral   Take 2 capsules (50 mg total) by mouth every 6 (six) hours as needed.   60 capsule   0   . meloxicam (MOBIC) 15 MG tablet   Oral   Take 1 tablet (15 mg total) by mouth daily. Patient not taking: Reported on 07/05/2015   30 tablet   0   . metoCLOPramide (REGLAN) 10 MG tablet   Oral   Take 1 tablet (10 mg total) by mouth 4 (four) times daily -  before meals and at bedtime.   60 tablet   0   . ranitidine (ZANTAC) 150 MG capsule   Oral   Take 1 capsule (150 mg total) by mouth 2 (two) times daily.   28 capsule   0   . sucralfate (CARAFATE) 1 g tablet   Oral   Take 1 tablet (1 g total) by mouth 4 (four) times daily.   120 tablet   1     Allergies Penicillins  No family history on file.  Social History Social History  Substance Use Topics  . Smoking  status: Never Smoker   . Smokeless tobacco: None  . Alcohol Use: No    Review of Systems Constitutional: No fever/chills Eyes: No visual changes. ENT: No sore throat. Cardiovascular: Denies chest pain. Respiratory: Denies shortness of breath. Gastrointestinal:  no vomiting.  No diarrhea.  No constipation. Genitourinary: Negative for dysuria. Musculoskeletal: As above Skin: Negative for rash. Neurological: Negative for headaches, focal weakness or numbness.  10-point ROS otherwise negative.  ____________________________________________   PHYSICAL EXAM:  VITAL SIGNS: ED Triage Vitals  Enc Vitals Group     BP 07/28/15 1326 125/68 mmHg     Pulse Rate 07/28/15 1326 87     Resp 07/28/15 1326 16     Temp 07/28/15 1326 98 F (36.7 C)     Temp Source 07/28/15 1326 Oral     SpO2 07/28/15 1326 99 %     Weight 07/28/15 1326 140 lb (63.504 kg)     Height 07/28/15 1326 5\' 6"  (1.676 m)     Head Cir --      Peak Flow --      Pain Score 07/28/15 1323 8     Pain Loc --      Pain Edu? --      Excl. in [place]? --  Constitutional: Alert and oriented. Well appearing and in no acute distress. Eyes: Conjunctivae are normal. PERRL. EOMI. Head: Atraumatic. Nose: No congestion/rhinnorhea. Mouth/Throat: Mucous membranes are moist.  Oropharynx non-erythematous. Neck: No stridor.   Cardiovascular: Normal rate, regular rhythm. Grossly normal heart sounds.  Good peripheral circulation. Respiratory: Normal respiratory effort.  No retractions. Lungs CTAB. Gastrointestinal: Soft with right lower quadrant tenderness to palpation. No distention. No abdominal bruits. Right-sided CVA tenderness palpation. Genitourinary: Normal external exam. Speculum exam with small amount of mucus-like discharge. Bimanual exam with uterine tenderness palpation as well as right adnexal tenderness to palpation. There is no left adnexal tenderness to palpation. No masses. Musculoskeletal: No lower extremity tenderness  nor edema.  No joint effusions. Neurologic:  Normal speech and language. No gross focal neurologic deficits are appreciated. No gait instability. Skin:  Skin is warm, dry and intact. No rash noted. Psychiatric: Mood and affect are normal. Speech and behavior are normal.  ____________________________________________   LABS (all labs ordered are listed, but only abnormal results are displayed)  Labs Reviewed  WET PREP, GENITAL - Abnormal; Notable for the following:    WBC, Wet Prep HPF POC MODERATE (*)    All other components within normal limits  CHLAMYDIA/NGC RT PCR ([facility] ONLY)   ____________________________________________  EKG   ____________________________________________  RADIOLOGY  Normal examination of the ovaries but with small follicular cysts and a collapsing follicular cyst with Doppler flow on the right side. CAT scan of the abdomen and pelvis without conclusive findings for appendicitis. However, early appendicitis cannot be ruled out. ____________________________________________   PROCEDURES   ____________________________________________   INITIAL IMPRESSION / ASSESSMENT AND PLAN / ED COURSE  Pertinent labs & imaging results that were available during my care of the patient were reviewed by me and considered in my medical decision making (see chart for details).  ----------------------------------------- 5:57 PM on 07/28/2015 -----------------------------------------  Dr.Loflin of the surgical service was down in the emergency department and evaluated the patient. She says that she cannot say if the patient has definitive appendicitis at this time but felt that the patient was more likely having pelvic pain related to her reproductive organs. Possibly related to the collapsing cyst. The patient was offered admission by Dr.Loflin for repeat abdominal exams and observation but the patient said that she would rather go home at this time. Dr.Loflin gave  her strict return precautions and I also discussed return precautions with the patient. The patient understands to return for any worsening or concerning symptoms but especially for worsening abdominal pain or fever. She will be given Percocet for pain control at home. ____________________________________________   FINAL CLINICAL IMPRESSION(S) / ED DIAGNOSES  Final diagnoses:  Female pelvic pain  Female pelvic pain   right lower quadrant abdominal pain. Ovarian cysts.    Orbie Pyo, MD 07/28/15 [id number]  Dr. Azalee Course explicitly discussed the diagnosis of appendicitis with the patient and that we could not rule it out 100% at this time.    Orbie Pyo, MD 07/28/15 [id number]

## 2015-07-28 NOTE — ED Notes (Signed)
Called patient in main lobby no answer,will call again.

## 2015-08-08 ENCOUNTER — Encounter: Payer: Self-pay | Admitting: Family Medicine

## 2015-08-08 ENCOUNTER — Ambulatory Visit (INDEPENDENT_AMBULATORY_CARE_PROVIDER_SITE_OTHER): Payer: 59 | Admitting: Family Medicine

## 2015-08-08 VITALS — BP 109/71 | HR 61 | Temp 98.9°F | Ht 64.7 in | Wt 140.0 lb

## 2015-08-08 DIAGNOSIS — G44209 Tension-type headache, unspecified, not intractable: Secondary | ICD-10-CM | POA: Diagnosis not present

## 2015-08-08 MED ORDER — CYCLOBENZAPRINE HCL 10 MG PO TABS: 10.0000 mg | ORAL_TABLET | Freq: Three times a day (TID) | ORAL | Status: DC | PRN

## 2015-08-08 MED ORDER — NAPROXEN 500 MG PO TABS: 500.0000 mg | ORAL_TABLET | Freq: Two times a day (BID) | ORAL | Status: DC

## 2015-08-08 NOTE — Patient Instructions (Addendum)
EXERCISES RANGE OF MOTION (ROM) AND STRETCHING EXERCISES - Trapezius Spasm These exercises may help you when beginning to rehabilitate your injury. Your symptoms may resolve with or without further involvement from your physician, physical therapist or athletic trainer. While completing these exercises, remember:   Restoring tissue flexibility helps normal motion to return to the joints. This allows healthier, less painful movement and activity.  An effective stretch should be held for at least 30 seconds.  A stretch should never be painful. You should only feel a gentle lengthening or release in the stretched tissue. STRETCH - Flexion, Standing  Stand with good posture. With an underhand grip on your right / left and an overhand grip on the opposite hand, grasp a broomstick or cane so that your hands are a little more than shoulder-width apart.  Keeping your right / left elbow straight and shoulder muscles relaxed, push the stick with your opposite hand to raise your right / left arm in front of your body and then overhead. Raise your arm until you feel a stretch in your right / left shoulder, but before you have increased shoulder pain.  Try to avoid shrugging your right / left shoulder as your arm rises by keeping your shoulder blade tucked down and toward your mid-back spine. Hold __________ seconds.  Slowly return to the starting position. Repeat __________ times. Complete this exercise __________ times per day.  STRETCH - Abduction, Supine  Stand with good posture. With an underhand grip on your right / left and an overhand grip on the opposite hand, grasp a broomstick or cane so that your hands are a little more than shoulder-width apart.  Keeping your right / left elbow straight and shoulder muscles relaxed, push the stick with your opposite hand to raise your right / left arm out to the side of your body and then overhead. Raise your arm until you feel a stretch in your right / left  shoulder, but before you have increased shoulder pain.  Try to avoid shrugging your right / left shoulder as your arm rises by keeping your shoulder blade tucked down and toward your mid-back spine. Hold __________ seconds.  Slowly return to the starting position. Repeat __________ times. Complete this exercise __________ times per day.  ROM - Flexion, Active-Assisted  Lie on your back. You may bend your knees for comfort.  Grasp a broomstick or cane so your hands are about shoulder-width apart. Your right / left hand should grip the end of the stick/cane so that your hand is positioned "thumbs-up," as if you were about to shake hands.  Using your healthy arm to lead, raise your right / left arm overhead until you feel a gentle stretch in your shoulder. Hold __________ seconds.  Use the stick/cane to assist in returning your right / left arm to its starting position. Repeat __________ times. Complete this exercise __________ times per day.  STRETCH - External Rotation and Abduction  Stagger your stance through a doorframe. It does not matter which foot is forward.  Choose one of the following positions as instructed by your physician, physical therapist or athletic trainer: place your hands:  and forearms above your head and on the door frame.  and forearms at head-height and on the door frame.  at elbow-height and on the door frame.  Keeping your head and chest upright and your stomach muscles tight to prevent over-extending your low-back, slowly shift your weight onto your front foot until you feel a stretch across your   chest and/or in the front of your shoulders.  Hold __________ seconds. Shift your weight to your back foot to release the stretch. Repeat __________ times. Complete this stretch __________ times per day.  STRENGTHENING EXERCISES - Trapezius Spasm These exercises may help you when beginning to rehabilitate your injury. They may resolve your symptoms with or without  further involvement from your physician, physical therapist or athletic trainer. While completing these exercises, remember:   Muscles can gain both the endurance and the strength needed for everyday activities through controlled exercises.  Complete these exercises as instructed by your physician, physical therapist or athletic trainer. Progress with the resistance and repetition exercises only as your caregiver advises.  You may experience muscle soreness or fatigue, but the pain or discomfort you are trying to eliminate should never worsen during these exercises. If this pain does worsen, stop and make certain you are following the directions exactly. If the pain is still present after adjustments, discontinue the exercise until you can discuss the trouble with your clinician. STRENGTH - Scapular Depression and Adduction  With good posture, sit on a firm chair. Support your arms in front of you with pillows, arm rests or a table top. Have your elbows in line with the sides of your body.  Gently draw your shoulder blades down and toward your mid-back spine. Gradually increase the tension without tensing the muscles along the top of your shoulders and the back of your neck.  Hold for __________ seconds. Slowly release the tension and relax your muscles completely before completing the next repetition.  After you have practiced this exercise, remove the arm support and complete it while standing as well as sitting. Repeat __________ times. Complete this exercise __________ times per day.  STRENGTH - Shoulder Abductors, Isometric   With good posture, stand or sit about 4-6 inches from a wall with your right / left side facing the wall.  Bend your right / left elbow. Gently press your right / left elbow into the wall. Increase the pressure gradually until you are pressing as hard as you can without shrugging your shoulder or increasing any shoulder discomfort.  Hold __________  seconds.  Release the tension slowly. Relax your shoulder muscles completely before you do the next repetition. Repeat __________ times. Complete this exercise __________ times per day.  STRENGTH - Shoulder Flexion, Isometric  With good posture and facing a wall, stand or sit about 4-6 inches away.  Keeping your right / left elbow straight, gently press the top of your fist into the wall. Increase the pressure gradually until you are pressing as hard as you can without shrugging your shoulder or increasing any shoulder discomfort.  Hold __________ seconds.  Release the tension slowly. Relax your shoulder muscles completely before you do the next repetition. Repeat __________ times. Complete this exercise __________ times per day.  STRENGTH - Internal Rotators  Secure a rubber exercise band/tubing to a fixed object so that it is at the same height as your right / left elbow when you are standing or sitting on a firm surface.  Stand or sit so that the secured exercise band/tubing is at your right / left side.  Bend your elbow 90 degrees. Place a folded towel or small pillow under your right / left arm so that your elbow is a few inches away from your side.  Keeping the tension on the exercise band/tubing, pull it across your body toward your abdomen. Be sure to keep your body steady so   that the movement is only coming from your shoulder rotating.  Hold __________ seconds. Release the tension in a controlled manner as you return to the starting position. Repeat __________ times. Complete this exercise __________ times per day.  STRENGTH - External Rotators  Secure a rubber exercise band/tubing to a fixed object so that it is at the same height as your right / left elbow when you are standing or sitting on a firm surface.  Stand or sit so that the secured exercise band/tubing is at your side that is not injured.  Bend your elbow 90 degrees. Place a folded towel or small pillow under your  right / left arm so that your elbow is a few inches away from your side.  Keeping the tension on the exercise band/tubing, pull it away from your body, as if pivoting on your elbow. Be sure to keep your body steady so that the movement is only coming from your shoulder rotating.  Hold __________ seconds. Release the tension in a controlled manner as you return to the starting position. Repeat __________ times. Complete this exercise __________ times per day.  STRENGTH - Shoulder Extensors  Secure a rubber exercise band/tubing so that it is at the height of your shoulders when you are either standing or sitting on a firm arm-less chair.  With a thumbs-up grip, grasp an end of the band/tubing in each hand. Straighten your elbows and lift your hands straight in front of you at shoulder height. Step back away from the secured end of band/tubing until it becomes tense.  Squeezing your shoulder blades together, pull your hands down to the sides of your thighs. Do not allow your hands to go behind you.  Hold for __________ seconds. Slowly ease the tension on the band/tubing as you reverse the directions and return to the starting position. Repeat __________ times. Complete this exercise __________ times per day.  STRENGTH - Shoulder Extensors, Prone  Lie on your stomach on a firm surface so that your right / left arm overhangs the edge. Rest your forehead on your opposite forearm. With your thumb facing away from your body and your elbow straight, hold a __________ weight in your hand.  Squeeze your right / left shoulder blade to your mid-back spine and then slowly raise your arm behind you to the height of the bed.  Hold for __________ seconds. Slowly reverse the directions and return to the starting position, controlling the weight as you lower your arm. Repeat __________ times. Complete this exercise __________ times per day.  STRENGTH - Horizontal Abductors Choose one of the two oppositions to  complete this exercise. Prone (lying on stomach):  Lie on your stomach on a firm surface so that your right / left arm overhangs the edge. Rest your forehead on your opposite forearm. With your palm facing the floor and your elbow straight, hold a __________ weight in your hand.  Squeeze your right / left shoulder blade to your mid-back spine and then slowly raise your arm to the height of the bed.  Hold for __________ seconds. Slowly reverse the directions and return to the starting position, controlling the weight as you lower your arm. Repeat __________ times. Complete this exercise __________ times per day. Standing:   Secure a rubber exercise band/tubing so that it is at the height of your shoulders when you are either standing or sitting on a firm arm-less chair.  Grasp an end of the band/tubing in each hand and have your palms   face each other. Straighten your elbows and lift your hands straight in front of you at shoulder height. Step back away from the secured end of band/tubing until it becomes tense.  Squeeze your shoulder blades together. Keeping your elbows locked and your hands at shoulder-height, bring your hands out to your side.  Hold __________ seconds. Slowly ease the tension on the band/tubing as you reverse the directions and return to the starting position. Repeat __________ times. Complete this exercise __________ times per day. STRENGTH - Scapular Retractors and Elevators  Secure a rubber exercise band/tubing so that it is at the height of your shoulders when you are either standing or sitting on a firm arm-less chair.  With a thumbs-up grip, grasp an end of the band/tubing in each hand. Step back away from the secured end of band/tubing until it becomes tense.  Squeezing your shoulder blades together, straighten your elbows and lift your hands straight over your head.  Hold for __________ seconds. Slowly ease the tension on the band/tubing as you reverse the  directions and return to the starting position. Repeat __________ times. Complete this exercise __________ times per day.    This information is not intended to replace advice given to you by your health care provider. Make sure you discuss any questions you have with your health care provider.   Document Released: 06/18/2005 Document Revised: 11/02/2014 Document Reviewed: 09/30/2008 Elsevier Interactive Patient Education 2016 [employer]  

## 2015-08-08 NOTE — Progress Notes (Signed)
BP 109/71 mmHg  Pulse 61  Temp(Src) 98.9 F (37.2 C)  Ht 5' 4.7" (1.643 m)  Wt 140 lb (63.504 kg)  BMI 23.52 kg/m2  SpO2 100%  LMP 08/05/2015 (Exact Date)   Subjective:    Patient ID: Gabrielle Jackson, female    DOB: 26-Oct-1990, 25 y.o.   MRN: [id number]  HPI: Gabrielle Jackson is a 25 y.o. female  Chief Complaint  Patient presents with  . Headache    Patient states that she has had a headache for the last week along with neck pain. She usually does not suffer from headaches.  . Neck Pain   HEADACHES Duration: 1 week Onset: sudden Severity: severe Quality: sharp Frequency: constant Location: back of her neck into her head Headache duration: constant Radiation: yes, into her head Alleviating factors: nothing Aggravating factors: nothing Headache status at time of visit: current headache Treatments attempted: Treatments attempted: rest, APAP, ibuprofen and aleve", excedrine   Aura: no Nausea:  no Vomiting: no  Upper respiratory symptoms: No Photophobia:  yes Phonophobia:  no Effect on social functioning:  yes Numbers of missed days of school/work each month: none Confusion:  no Gait disturbance/ataxia:  no Behavioral changes:  no Fevers:  yes  Relevant past medical, surgical, family and social history reviewed and updated as indicated. Interim medical history since our last visit reviewed. Allergies and medications reviewed and updated.  Review of Systems  Constitutional: Positive for fever, chills and fatigue. Negative for diaphoresis, activity change, appetite change and unexpected weight change.  HENT: Negative.   Eyes: Negative.   Respiratory: Negative.   Cardiovascular: Negative.   Musculoskeletal: Negative.  Negative for myalgias, back pain, joint swelling, arthralgias, gait problem, neck pain and neck stiffness.  Psychiatric/Behavioral: Negative.     Per HPI unless specifically indicated above     Objective:    BP 109/71 mmHg  Pulse 61   Temp(Src) 98.9 F (37.2 C)  Ht 5' 4.7" (1.643 m)  Wt 140 lb (63.504 kg)  BMI 23.52 kg/m2  SpO2 100%  LMP 08/05/2015 (Exact Date)  Wt Readings from Last 3 Encounters:  08/08/15 140 lb (63.504 kg)  07/28/15 140 lb (63.504 kg)  07/28/15 140 lb (63.504 kg)    Physical Exam  Constitutional: She is oriented to person, place, and time. She appears well-developed and well-nourished. No distress.  HENT:  Head: Normocephalic and atraumatic.  Right Ear: Hearing, tympanic membrane, external ear and ear canal normal.  Left Ear: Hearing, tympanic membrane, external ear and ear canal normal.  Nose: Right sinus exhibits maxillary sinus tenderness and frontal sinus tenderness. Left sinus exhibits no maxillary sinus tenderness and no frontal sinus tenderness.  Mouth/Throat: Uvula is midline, oropharynx is clear and moist and mucous membranes are normal. No oropharyngeal exudate.  Eyes: Conjunctivae, EOM and lids are normal. Pupils are equal, round, and reactive to light. Right eye exhibits no discharge. Left eye exhibits no discharge. No scleral icterus.  Neck: Normal range of motion. Neck supple. No JVD present. No tracheal deviation present. No thyromegaly present.  Cardiovascular: Normal rate, regular rhythm, normal heart sounds and intact distal pulses.  Exam reveals no gallop and no friction rub.   No murmur heard. Pulmonary/Chest: Effort normal and breath sounds normal. No stridor. No respiratory distress. She has no wheezes. She has no rales. She exhibits no tenderness.  Musculoskeletal:  Significant trapezius spasm on the R  Lymphadenopathy:    She has no cervical adenopathy.  Neurological: She is alert and oriented  to person, place, and time.  Skin: Skin is warm, dry and intact. No rash noted. She is not diaphoretic. No erythema. No pallor.  Psychiatric: She has a normal mood and affect. Her speech is normal and behavior is normal. Judgment and thought content normal. Cognition and memory are  normal.  Nursing note and vitals reviewed.   Results for orders placed or performed during the hospital encounter of 07/28/15  Wet prep, genital  Result Value Ref Range   Yeast Wet Prep HPF POC NONE SEEN NONE SEEN   Trich, Wet Prep NONE SEEN NONE SEEN   Clue Cells Wet Prep HPF POC NONE SEEN NONE SEEN   WBC, Wet Prep HPF POC MODERATE (A) NONE SEEN   Sperm NONE SEEN   Chlamydia/NGC rt PCR ([facility] only)  Result Value Ref Range   Specimen source GC/Chlam ENDOCERVICAL    Chlamydia Tr NOT DETECTED NOT DETECTED   N gonorrhoeae NOT DETECTED NOT DETECTED      Assessment & Plan:   Problem List Items Addressed This Visit    None    Visit Diagnoses    Tension-type headache, not intractable, unspecified chronicity pattern    -  Primary    Will check tic diseases. Seems to be due to trap spasm. Flexeril and naproxen and stretches given. Call if not getting better. Await results.     Relevant Medications    cyclobenzaprine (FLEXERIL) 10 MG tablet    naproxen (NAPROSYN) 500 MG tablet    Other Relevant Orders    Lyme Ab/Western Blot Reflex    Rocky mtn spotted fvr abs pnl(IgG+IgM)    Babesia microti Antibody Panel    Ehrlichia Antibody Panel    Comprehensive metabolic panel    CBC with Differential/Platelet        Follow up plan: Return if symptoms worsen or fail to improve.

## 2015-08-10 ENCOUNTER — Encounter: Payer: Self-pay | Admitting: Family Medicine

## 2015-08-10 LAB — COMPREHENSIVE METABOLIC PANEL
ALT: 17 IU/L (ref 0–32)
AST: 22 IU/L (ref 0–40)
Albumin/Globulin Ratio: 1.7 (ref 1.1–2.5)
Albumin: 4.5 g/dL (ref 3.5–5.5)
Alkaline Phosphatase: 109 IU/L (ref 39–117)
BUN/Creatinine Ratio: 15 (ref 8–20)
BUN: 12 mg/dL (ref 6–20)
Bilirubin Total: 0.3 mg/dL (ref 0.0–1.2)
CALCIUM: 9.5 mg/dL (ref 8.7–10.2)
CO2: 23 mmol/L (ref 18–29)
Chloride: 102 mmol/L (ref 96–106)
Creatinine, Ser: 0.79 mg/dL (ref 0.57–1.00)
GFR, EST AFRICAN AMERICAN: 121 mL/min/{1.73_m2} (ref >59)
GFR, EST NON AFRICAN AMERICAN: 105 mL/min/{1.73_m2} (ref >59)
GLUCOSE: 85 mg/dL (ref 65–99)
Globulin, Total: 2.7 g/dL (ref 1.5–4.5)
POTASSIUM: 4.3 mmol/L (ref 3.5–5.2)
Sodium: 142 mmol/L (ref 134–144)
TOTAL PROTEIN: 7.2 g/dL (ref 6.0–8.5)

## 2015-08-10 LAB — CBC WITH DIFFERENTIAL/PLATELET
Basophils Absolute: 0 10*3/uL (ref 0.0–0.2)
Basos: 0 %
EOS (ABSOLUTE): 0.1 10*3/uL (ref 0.0–0.4)
EOS: 1 %
HEMATOCRIT: 41.7 % (ref 34.0–46.6)
Hemoglobin: 14.1 g/dL (ref 11.1–15.9)
Immature Grans (Abs): 0 10*3/uL (ref 0.0–0.1)
Immature Granulocytes: 0 %
LYMPHS ABS: 2.8 10*3/uL (ref 0.7–3.1)
Lymphs: 41 %
MCH: 31.1 pg (ref 26.6–33.0)
MCHC: 33.8 g/dL (ref 31.5–35.7)
MCV: 92 fL (ref 79–97)
MONOS ABS: 0.5 10*3/uL (ref 0.1–0.9)
Monocytes: 8 %
Neutrophils Absolute: 3.4 10*3/uL (ref 1.4–7.0)
Neutrophils: 50 %
Platelets: 325 10*3/uL (ref 150–379)
RBC: 4.53 x10E6/uL (ref 3.77–5.28)
RDW: 12.4 % (ref 12.3–15.4)
WBC: 6.9 10*3/uL (ref 3.4–10.8)

## 2015-08-10 LAB — ROCKY MTN SPOTTED FVR ABS PNL(IGG+IGM)
RMSF IGG: NEGATIVE
RMSF IgM: 0.57 index (ref 0.00–0.89)

## 2015-08-10 LAB — EHRLICHIA ANTIBODY PANEL
E. Chaffeensis (HME) IgM Titer: NEGATIVE
E.Chaffeensis (HME) IgG: NEGATIVE
HGE IGG TITER: NEGATIVE
HGE IGM TITER: NEGATIVE

## 2015-08-10 LAB — LYME AB/WESTERN BLOT REFLEX

## 2015-08-10 LAB — BABESIA MICROTI ANTIBODY PANEL
Babesia microti IgG: <1:10 {titer}
Babesia microti IgM: <1:10 {titer}

## 2015-08-15 ENCOUNTER — Ambulatory Visit: Payer: 59 | Admitting: Family Medicine

## 2015-08-29 ENCOUNTER — Telehealth: Payer: Self-pay

## 2015-08-29 NOTE — Telephone Encounter (Signed)
Pt called needs a copy of immunization records today if possible. Please call when paperwork is ready. Thanks.

## 2015-08-29 NOTE — Telephone Encounter (Signed)
Patient notified that her immunization record is ready to be picked up.

## 2015-09-05 ENCOUNTER — Ambulatory Visit (INDEPENDENT_AMBULATORY_CARE_PROVIDER_SITE_OTHER): Payer: 59 | Admitting: Family Medicine

## 2015-09-05 ENCOUNTER — Other Ambulatory Visit: Payer: Self-pay | Admitting: Family Medicine

## 2015-09-05 ENCOUNTER — Encounter: Payer: Self-pay | Admitting: Family Medicine

## 2015-09-05 VITALS — BP 122/80 | HR 73 | Temp 98.7°F | Ht 65.0 in | Wt 140.0 lb

## 2015-09-05 DIAGNOSIS — F411 Generalized anxiety disorder: Secondary | ICD-10-CM | POA: Diagnosis not present

## 2015-09-05 DIAGNOSIS — R002 Palpitations: Secondary | ICD-10-CM

## 2015-09-05 DIAGNOSIS — F419 Anxiety disorder, unspecified: Secondary | ICD-10-CM

## 2015-09-05 MED ORDER — SERTRALINE HCL 50 MG PO TABS: ORAL_TABLET | ORAL | Status: DC

## 2015-09-05 NOTE — Patient Instructions (Signed)
Generalized Anxiety Disorder Generalized anxiety disorder (GAD) is a mental disorder. It interferes with life functions, including relationships, work, and school. GAD is different from normal anxiety, which everyone experiences at some point in their lives in response to specific life events and activities. Normal anxiety actually helps us prepare for and get through these life events and activities. Normal anxiety goes away after the event or activity is over.  GAD causes anxiety that is not necessarily related to specific events or activities. It also causes excess anxiety in proportion to specific events or activities. The anxiety associated with GAD is also difficult to control. GAD can vary from mild to severe. People with severe GAD can have intense waves of anxiety with physical symptoms (panic attacks).  SYMPTOMS The anxiety and worry associated with GAD are difficult to control. This anxiety and worry are related to many life events and activities and also occur more days than not for 6 months or longer. People with GAD also have three or more of the following symptoms (one or more in children):  Restlessness.   Fatigue.  Difficulty concentrating.   Irritability.  Muscle tension.  Difficulty sleeping or unsatisfying sleep. DIAGNOSIS GAD is diagnosed through an assessment by your health care provider. Your health care provider will ask you questions aboutyour mood,physical symptoms, and events in your life. Your health care provider may ask you about your medical history and use of alcohol or drugs, including prescription medicines. Your health care provider may also do a physical exam and blood tests. Certain medical conditions and the use of certain substances can cause symptoms similar to those associated with GAD. Your health care provider may refer you to a mental health specialist for further evaluation. TREATMENT The following therapies are usually used to treat GAD:    Medication. Antidepressant medication usually is prescribed for long-term daily control. Antianxiety medicines may be added in severe cases, especially when panic attacks occur.   Talk therapy (psychotherapy). Certain types of talk therapy can be helpful in treating GAD by providing support, education, and guidance. A form of talk therapy called cognitive behavioral therapy can teach you healthy ways to think about and react to daily life events and activities.  Stress managementtechniques. These include yoga, meditation, and exercise and can be very helpful when they are practiced regularly. A mental health specialist can help determine which treatment is best for you. Some people see improvement with one therapy. However, other people require a combination of therapies.   This information is not intended to replace advice given to you by your health care provider. Make sure you discuss any questions you have with your health care provider.   Document Released: 10/13/2012 Document Revised: 07/09/2014 Document Reviewed: 10/13/2012 Elsevier Interactive Patient Education 2016 [employer]  

## 2015-09-05 NOTE — Progress Notes (Signed)
BP 122/80 mmHg  Pulse 73  Temp(Src) 98.7 F (37.1 C)  Ht 5\' 5"  (1.651 m)  Wt 140 lb (63.504 kg)  BMI 23.30 kg/m2  SpO2 100%  LMP 09/03/2015   Subjective:    Patient ID: Gabrielle Jackson, female    DOB: 03-21-1991, 25 y.o.   MRN: [id number]  HPI: Gabrielle Jackson is a 25 y.o. female  Chief Complaint  Patient presents with  . Anxiety   ANXIETY/STRESS- was on zoloft for about a year, but came off it when she was pregnant, and never went back on it.  Duration:exacerbated Anxious mood: yes  Excessive worrying: yes Irritability: yes  Sweating: no Nausea: no Palpitations:yes Hyperventilation: yes Panic attacks: no Agoraphobia: no  Obscessions/compulsions: yes Depressed mood: yes Depression screen PHQ 2/9 09/05/2015  Decreased Interest 1  Down, Depressed, Hopeless 1  PHQ - 2 Score 2   Anhedonia: no Weight changes: no Insomnia: yes hard to stay asleep  Hypersomnia: no Fatigue/loss of energy: yes Feelings of worthlessness: no Feelings of guilt: no Impaired concentration/indecisiveness: yes Suicidal ideations: no  Crying spells: yes Recent Stressors/Life Changes: yes   Relationship problems: no   Family stress: yes     Financial stress: yes    Job stress: yes    Recent death/loss: no  Relevant past medical, surgical, family and social history reviewed and updated as indicated. Interim medical history since our last visit reviewed. Allergies and medications reviewed and updated.  Review of Systems  Constitutional: Negative.   Respiratory: Negative.   Cardiovascular: Negative.   Psychiatric/Behavioral: Negative.     Per HPI unless specifically indicated above     Objective:    BP 122/80 mmHg  Pulse 73  Temp(Src) 98.7 F (37.1 C)  Ht 5\' 5"  (1.651 m)  Wt 140 lb (63.504 kg)  BMI 23.30 kg/m2  SpO2 100%  LMP 09/03/2015  Wt Readings from Last 3 Encounters:  09/05/15 140 lb (63.504 kg)  08/08/15 140 lb (63.504 kg)  07/28/15 140 lb (63.504 kg)     Physical Exam  Constitutional: She is oriented to person, place, and time. She appears well-developed and well-nourished. No distress.  HENT:  Head: Normocephalic and atraumatic.  Right Ear: Hearing normal.  Left Ear: Hearing normal.  Nose: Nose normal.  Eyes: Conjunctivae and lids are normal. Right eye exhibits no discharge. Left eye exhibits no discharge. No scleral icterus.  Cardiovascular: Normal rate, regular rhythm, normal heart sounds and intact distal pulses.  Exam reveals no gallop and no friction rub.   No murmur heard. Pulmonary/Chest: Effort normal and breath sounds normal. No respiratory distress. She has no wheezes. She has no rales. She exhibits no tenderness.  Musculoskeletal: Normal range of motion.  Neurological: She is alert and oriented to person, place, and time.  Skin: Skin is warm, dry and intact. No rash noted. No erythema. No pallor.  Psychiatric: She has a normal mood and affect. Her speech is normal and behavior is normal. Judgment and thought content normal. Cognition and memory are normal.  Nursing note and vitals reviewed.   Results for orders placed or performed in visit on 08/08/15  Lyme Ab/Western Blot Reflex  Result Value Ref Range   Lyme IgG/IgM Ab <0.91 0.00 - 0.90 ISR   LYME DISEASE AB, QUANT, IGM <0.80 0.00 - 0.79 index  Rocky mtn spotted fvr abs pnl(IgG+IgM)  Result Value Ref Range   RMSF IgG Negative Negative   RMSF IgM 0.57 0.00 - 0.89 index  Babesia microti Antibody  Panel  Result Value Ref Range   Babesia microti IgM <1:10 Neg:<1:10   Babesia microti IgG [id number]  Ehrlichia Antibody Panel  Result Value Ref Range   E.Chaffeensis (HME) IgG Negative Neg:<1:64   E. Chaffeensis (HME) IgM Titer Negative Neg:<1:20   HGE IgG Titer Negative Neg:<1:64   HGE IgM Titer Negative Neg:<1:20  Comprehensive metabolic panel  Result Value Ref Range   Glucose 85 65 - 99 mg/dL   BUN 12 6 - 20 mg/dL   Creatinine, Ser 0.79 0.57 - 1.00 mg/dL   GFR  calc non Af Amer 105 >59 mL/min/1.73   GFR calc Af Amer 121 >59 mL/min/1.73   BUN/Creatinine Ratio 15 8 - 20   Sodium 142 134 - 144 mmol/L   Potassium 4.3 3.5 - 5.2 mmol/L   Chloride 102 96 - 106 mmol/L   CO2 23 18 - 29 mmol/L   Calcium 9.5 8.7 - 10.2 mg/dL   Total Protein 7.2 6.0 - 8.5 g/dL   Albumin 4.5 3.5 - 5.5 g/dL   Globulin, Total 2.7 1.5 - 4.5 g/dL   Albumin/Globulin Ratio 1.7 1.1 - 2.5   Bilirubin Total 0.3 0.0 - 1.2 mg/dL   Alkaline Phosphatase 109 39 - 117 IU/L   AST 22 0 - 40 IU/L   ALT 17 0 - 32 IU/L  CBC with Differential/Platelet  Result Value Ref Range   WBC 6.9 3.4 - 10.8 x10E3/uL   RBC 4.53 3.77 - 5.28 x10E6/uL   Hemoglobin 14.1 11.1 - 15.9 g/dL   Hematocrit 41.7 34.0 - 46.6 %   MCV 92 79 - 97 fL   MCH 31.1 26.6 - 33.0 pg   MCHC 33.8 31.5 - 35.7 g/dL   RDW 12.4 12.3 - 15.4 %   Platelets 325 150 - 379 x10E3/uL   Neutrophils 50 %   Lymphs 41 %   Monocytes 8 %   Eos 1 %   Basos 0 %   Neutrophils Absolute 3.4 1.4 - 7.0 x10E3/uL   Lymphocytes Absolute 2.8 0.7 - 3.1 x10E3/uL   Monocytes Absolute 0.5 0.1 - 0.9 x10E3/uL   EOS (ABSOLUTE) 0.1 0.0 - 0.4 x10E3/uL   Basophils Absolute 0.0 0.0 - 0.2 x10E3/uL   Immature Granulocytes 0 %   Immature Grans (Abs) 0.0 0.0 - 0.1 x10E3/uL      Assessment & Plan:   Problem List Items Addressed This Visit      Other   Anxiety disorder - Primary    Would like to restart on her zoloft. Rx written. Check back in in 1 month.        Other Visit Diagnoses    Palpitations        EKG reviewed today and was normal.         Follow up plan: Return in about 4 weeks (around 10/03/2015) for follow up on mood.

## 2015-09-05 NOTE — Assessment & Plan Note (Signed)
Would like to restart on her zoloft. Rx written. Check back in in 1 month.

## 2015-09-16 ENCOUNTER — Ambulatory Visit: Payer: 59

## 2015-10-04 ENCOUNTER — Ambulatory Visit (INDEPENDENT_AMBULATORY_CARE_PROVIDER_SITE_OTHER): Payer: Commercial Managed Care - PPO | Admitting: Family Medicine

## 2015-10-04 ENCOUNTER — Encounter: Payer: Self-pay | Admitting: Family Medicine

## 2015-10-04 VITALS — BP 111/72 | HR 67 | Temp 98.9°F | Ht 65.2 in | Wt 142.0 lb

## 2015-10-04 DIAGNOSIS — F411 Generalized anxiety disorder: Secondary | ICD-10-CM | POA: Diagnosis not present

## 2015-10-04 DIAGNOSIS — R002 Palpitations: Secondary | ICD-10-CM

## 2015-10-04 MED ORDER — VENLAFAXINE HCL ER 75 MG PO CP24
75.0000 mg | ORAL_CAPSULE | Freq: Every day | ORAL | Status: DC
Start: 2015-10-04 — End: 2015-11-27

## 2015-10-04 NOTE — Assessment & Plan Note (Signed)
Didn't do well on zoloft. Will switch to effexor. Discussed that this is not as well studied in pregnancy. She is aware and will let us know if she is trying or if she thinks she may be pregnant. Continue to monitor and recheck in 1 month.

## 2015-10-04 NOTE — Progress Notes (Signed)
BP 111/72 mmHg  Pulse 67  Temp(Src) 98.9 F (37.2 C)  Ht 5' 5.2" (1.656 m)  Wt 142 lb (64.411 kg)  BMI 23.49 kg/m2  SpO2 100%  LMP 09/03/2015   Subjective:    Patient ID: Gabrielle Jackson, female    DOB: 10-Feb-1991, 25 y.o.   MRN: [id number]  HPI: Gabrielle Jackson is a 25 y.o. female  Chief Complaint  Patient presents with  . Depression  . Anxiety  . Palpitations   DEPRESSION Mood status: uncontrolled Satisfied with current treatment?: no Symptom severity: moderate  Duration of current treatment : 1 month Side effects: yes, irritability, severe fatigue Medication compliance: good compliance Psychotherapy/counseling: no  Previous psychiatric medications: zoloft Depressed mood: yes Anxious mood: yes Anhedonia: no Significant weight loss or gain: no Insomnia: no  Fatigue: yes Feelings of worthlessness or guilt: yes Impaired concentration/indecisiveness: yes Suicidal ideations: no Hopelessness: no Crying spells: yes Depression screen [facility] 2/9 10/04/2015 09/05/2015  Decreased Interest 1 1  Down, Depressed, Hopeless 2 1  PHQ - 2 Score 3 2  Altered sleeping 2 -  Tired, decreased energy 3 -  Change in appetite 3 -  Feeling bad or failure about yourself  0 -  Trouble concentrating 1 -  Moving slowly or fidgety/restless 0 -  Suicidal thoughts 0 -  PHQ-9 Score 12 -  Difficult doing work/chores Somewhat difficult -   GAD 7 : Generalized Anxiety Score 10/04/2015 09/05/2015  Nervous, Anxious, on Edge 3 3  Control/stop worrying 3 3  Worry too much - different things 3 3  Trouble relaxing 2 2  Restless 1 2  Easily annoyed or irritable 2 3  Afraid - awful might happen 2 3  Total GAD 7 Score 16 19  Anxiety Difficulty Very difficult Somewhat difficult   Not feeling normal. Has noticed that her heart continues to race and that she gets SOB when that happens. Mainly when laying down at night.   Relevant past medical, surgical, family and social history reviewed and updated  as indicated. Interim medical history since our last visit reviewed. Allergies and medications reviewed and updated.  Review of Systems  Constitutional: Negative.   Respiratory: Negative.   Cardiovascular: Positive for palpitations. Negative for chest pain and leg swelling.  Psychiatric/Behavioral: Negative.     Per HPI unless specifically indicated above     Objective:    BP 111/72 mmHg  Pulse 67  Temp(Src) 98.9 F (37.2 C)  Ht 5' 5.2" (1.656 m)  Wt 142 lb (64.411 kg)  BMI 23.49 kg/m2  SpO2 100%  LMP 09/03/2015  Wt Readings from Last 3 Encounters:  10/04/15 142 lb (64.411 kg)  09/05/15 140 lb (63.504 kg)  08/08/15 140 lb (63.504 kg)    Physical Exam  Constitutional: She is oriented to person, place, and time. She appears well-developed and well-nourished. No distress.  HENT:  Head: Normocephalic and atraumatic.  Right Ear: Hearing normal.  Left Ear: Hearing normal.  Nose: Nose normal.  Eyes: Conjunctivae and lids are normal. Right eye exhibits no discharge. Left eye exhibits no discharge. No scleral icterus.  Cardiovascular: Normal rate, regular rhythm, normal heart sounds and intact distal pulses.  Exam reveals no gallop and no friction rub.   No murmur heard. Pulmonary/Chest: Effort normal and breath sounds normal. No respiratory distress. She has no wheezes. She has no rales. She exhibits no tenderness.  Musculoskeletal: Normal range of motion.  Neurological: She is alert and oriented to person, place, and time.  Skin:  Skin is warm, dry and intact. No rash noted. She is not diaphoretic. No erythema. No pallor.  Psychiatric: She has a normal mood and affect. Her speech is normal and behavior is normal. Judgment and thought content normal. Cognition and memory are normal.  Nursing note and vitals reviewed.   Results for orders placed or performed in visit on 08/08/15  Lyme Ab/Western Blot Reflex  Result Value Ref Range   Lyme IgG/IgM Ab <0.91 0.00 - 0.90 ISR    LYME DISEASE AB, QUANT, IGM <0.80 0.00 - 0.79 index  Rocky mtn spotted fvr abs pnl(IgG+IgM)  Result Value Ref Range   RMSF IgG Negative Negative   RMSF IgM 0.57 0.00 - 0.89 index  Babesia microti Antibody Panel  Result Value Ref Range   Babesia microti IgM <1:10 Neg:<1:10   Babesia microti IgG [id number]  Ehrlichia Antibody Panel  Result Value Ref Range   E.Chaffeensis (HME) IgG Negative Neg:<1:64   E. Chaffeensis (HME) IgM Titer Negative Neg:<1:20   HGE IgG Titer Negative Neg:<1:64   HGE IgM Titer Negative Neg:<1:20  Comprehensive metabolic panel  Result Value Ref Range   Glucose 85 65 - 99 mg/dL   BUN 12 6 - 20 mg/dL   Creatinine, Ser 0.79 0.57 - 1.00 mg/dL   GFR calc non Af Amer 105 >59 mL/min/1.73   GFR calc Af Amer 121 >59 mL/min/1.73   BUN/Creatinine Ratio 15 8 - 20   Sodium 142 134 - 144 mmol/L   Potassium 4.3 3.5 - 5.2 mmol/L   Chloride 102 96 - 106 mmol/L   CO2 23 18 - 29 mmol/L   Calcium 9.5 8.7 - 10.2 mg/dL   Total Protein 7.2 6.0 - 8.5 g/dL   Albumin 4.5 3.5 - 5.5 g/dL   Globulin, Total 2.7 1.5 - 4.5 g/dL   Albumin/Globulin Ratio 1.7 1.1 - 2.5   Bilirubin Total 0.3 0.0 - 1.2 mg/dL   Alkaline Phosphatase 109 39 - 117 IU/L   AST 22 0 - 40 IU/L   ALT 17 0 - 32 IU/L  CBC with Differential/Platelet  Result Value Ref Range   WBC 6.9 3.4 - 10.8 x10E3/uL   RBC 4.53 3.77 - 5.28 x10E6/uL   Hemoglobin 14.1 11.1 - 15.9 g/dL   Hematocrit 41.7 34.0 - 46.6 %   MCV 92 79 - 97 fL   MCH 31.1 26.6 - 33.0 pg   MCHC 33.8 31.5 - 35.7 g/dL   RDW 12.4 12.3 - 15.4 %   Platelets 325 150 - 379 x10E3/uL   Neutrophils 50 %   Lymphs 41 %   Monocytes 8 %   Eos 1 %   Basos 0 %   Neutrophils Absolute 3.4 1.4 - 7.0 x10E3/uL   Lymphocytes Absolute 2.8 0.7 - 3.1 x10E3/uL   Monocytes Absolute 0.5 0.1 - 0.9 x10E3/uL   EOS (ABSOLUTE) 0.1 0.0 - 0.4 x10E3/uL   Basophils Absolute 0.0 0.0 - 0.2 x10E3/uL   Immature Granulocytes 0 %   Immature Grans (Abs) 0.0 0.0 - 0.1 x10E3/uL       Assessment & Plan:   Problem List Items Addressed This Visit      Other   Anxiety disorder - Primary    Didn't do well on zoloft. Will switch to effexor. Discussed that this is not as well studied in pregnancy. She is aware and will let us know if she is trying or if she thinks she may be pregnant. Continue to monitor and recheck in 1 month.  Other Visit Diagnoses    Palpitations        Likely due to her anxiety, however with family history of arrythmia, will get her into see cardiology. Will likely need holter monitor. Call with concerns.     Relevant Orders    Ambulatory referral to Cardiology        Follow up plan: Return in about 3 weeks (around 10/25/2015) for follow up mood.

## 2015-10-06 ENCOUNTER — Ambulatory Visit: Payer: 59 | Admitting: Family Medicine

## 2015-10-24 ENCOUNTER — Ambulatory Visit (INDEPENDENT_AMBULATORY_CARE_PROVIDER_SITE_OTHER): Payer: Commercial Managed Care - PPO | Admitting: Cardiology

## 2015-10-24 ENCOUNTER — Encounter: Payer: Self-pay | Admitting: Cardiology

## 2015-10-24 VITALS — BP 110/72 | HR 69 | Ht 69.0 in | Wt 140.5 lb

## 2015-10-24 DIAGNOSIS — R002 Palpitations: Secondary | ICD-10-CM | POA: Diagnosis not present

## 2015-10-24 DIAGNOSIS — R079 Chest pain, unspecified: Secondary | ICD-10-CM | POA: Diagnosis not present

## 2015-10-24 NOTE — Progress Notes (Signed)
Cardiology Office Note   Date:  10/24/2015   ID:  Gabrielle Jackson, DOB 01-29-1991, MRN [id number]  Referring Doctor:  Park Liter, DO   Cardiologist:   Wende Bushy, MD   Reason for consultation:  Chief Complaint  Patient presents with  . other    Ref by Dr. Wynetta Emery for palpitations. Meds reviewed by the patient verbally. Pt. c/o irreg. heart beats that take her breath.       History of Present Illness: Gabrielle Jackson is a 25 y.o. female who presents for Palpitations, chest pain.  Terms of palpitations, she noted onset of symptoms since December 2016. Randomly occurring, symptoms of fluttering or skipping beats or racing heart located in the chest. Nonradiating. Brief in duration lasting a second or so. Not related to anything in particular. Mild in severity.  In terms of chest pain, husband brings up history of several episodes of chest pain. According to the patient this has been ongoing on since December as well. The chest pain is described as tightness in the chest, nonradiating, mild in severity. Happening all of a sudden and randomly. Not related to exertion. Last for a few seconds. Patient has not brought this up with PCP.  Otherwise, patient does not complain of chest pain or shortness of breath with exertion. She thinks that her symptoms may have to do with the stresses of young and new mother. No headache, fever, cough, colds, abdominal pain, orthopnea, PND, edema.  ROS:  Please see the history of present illness. Aside from mentioned under HPI, all other systems are reviewed and negative.     History reviewed. No pertinent past medical history.  History reviewed. No pertinent past surgical history.   reports that she has never smoked. She has never used smokeless tobacco. She reports that she drinks alcohol. She reports that she does not use illicit drugs.   family history includes COPD in her maternal grandfather and paternal grandmother; Hypertension in  her father.   great-grandfather had massive heart attack in his 37s and died.  Current Outpatient Prescriptions  Medication Sig Dispense Refill  . venlafaxine XR (EFFEXOR XR) 75 MG 24 hr capsule Take 1 capsule (75 mg total) by mouth daily with breakfast. 30 capsule 1   No current facility-administered medications for this visit.    Allergies: Penicillins    PHYSICAL EXAM: VS:  BP 110/72 mmHg  Pulse 69  Ht 5\' 9"  (1.753 m)  Wt 140 lb 8 oz (63.73 kg)  BMI 20.74 kg/m2 , Body mass index is 20.74 kg/(m^2). Wt Readings from Last 3 Encounters:  10/24/15 140 lb 8 oz (63.73 kg)  10/04/15 142 lb (64.411 kg)  09/05/15 140 lb (63.504 kg)    GENERAL:  well developed, well nourished, obese, not in acute distress HEENT: normocephalic, pink conjunctivae, anicteric sclerae, no xanthelasma, normal dentition, oropharynx clear NECK:  no neck vein engorgement, JVP normal, no hepatojugular reflux, carotid upstroke brisk and symmetric, no bruit, no thyromegaly, no lymphadenopathy LUNGS:  good respiratory effort, clear to auscultation bilaterally CV:  PMI not displaced, no thrills, no lifts, S1 and S2 within normal limits, no palpable S3 or S4, no murmurs, no rubs, no gallops ABD:  Soft, nontender, nondistended, normoactive bowel sounds, no abdominal aortic bruit, no hepatomegaly, no splenomegaly MS: nontender back, no kyphosis, no scoliosis, no joint deformities EXT:  2+ DP/PT pulses, no edema, no varicosities, no cyanosis, no clubbing SKIN: warm, nondiaphoretic, normal turgor, no ulcers NEUROPSYCH: alert, oriented to person, place,  and time, sensory/motor grossly intact, normal mood, appropriate affect  Recent Labs: 07/28/2015: Hemoglobin 14.9 08/08/2015: ALT 17; BUN 12; Creatinine, Ser 0.79; Platelets 325; Potassium 4.3; Sodium 142   Lipid Panel No results found for: CHOL, TRIG, HDL, CHOLHDL, VLDL, LDLCALC, LDLDIRECT   Other studies Reviewed:  EKG:  The ekg from04/24/2017  was personally reviewed  by me and it revealed Sinus rhythm, 69 BPM. Additional studies/ records that were reviewed personally reviewed by me today include: None available    ASSESSMENT AND PLAN:   palpitations   since symptoms occur in a daily basis, recommend 24-hour Holter monitor. Recommend echocardiogram.  Chest pain, atypical in nature. She has minimal risk factors for CAD. However, husband is very concerned about her episodes. Recommend stress echocardiogram.  Current medicines are reviewed at length with the patient today.  The patient does not have concerns regarding medicines.  Labs/ tests ordered today include:  Orders Placed This Encounter  Procedures  . Holter monitor - 24 hour  . EKG 12-Lead  . Echocardiogram  . Echo stress    I had a lengthy and detailed discussion with the patient regarding diagnoses, prognosis, diagnostic options, treatment options .   I counseled the patient on importance of lifestyle modification including heart healthy diet, regular physical activity.   Disposition:   FU with undersigned after tests    Signed, Wende Bushy, MD  10/24/2015 3:58 PM    [place]

## 2015-10-24 NOTE — Patient Instructions (Addendum)
Medication Instructions:  Your physician recommends that you continue on your current medications as directed. Please refer to the Current Medication list given to you today.   Labwork: None ordered  Testing/Procedures: Your physician has requested that you have an echocardiogram. Echocardiography is a painless test that uses sound waves to create images of your heart. It provides your doctor with information about the size and shape of your heart and how well your heart's chambers and valves are working. This procedure takes approximately one hour. There are no restrictions for this procedure.  Date & Time:_____________________________________________________________  Your physician has requested that you have a stress echocardiogram. For further information please visit [email or web address]. Please follow instruction sheet as given.  Date & Time:_____________________________________________________________  Your physician has recommended that you wear a holter monitor. Holter monitors are medical devices that record the heart's electrical activity. Doctors most often use these monitors to diagnose arrhythmias. Arrhythmias are problems with the speed or rhythm of the heartbeat. The monitor is a small, portable device. You can wear one while you do your normal daily activities. This is usually used to diagnose what is causing palpitations/syncope (passing out).  Date & Time: ___________________________________________________________    Follow-Up: Your physician recommends that you schedule a follow-up appointment after testing to review results.  Date & Time: ____________________________________________________________   Any Other Special Instructions Will Be Listed Below (If Applicable).     If you need a refill on your cardiac medications before your next appointment, please call your pharmacy.  Echocardiogram An echocardiogram, or echocardiography, uses sound waves (ultrasound)  to produce an image of your heart. The echocardiogram is simple, painless, obtained within a short period of time, and offers valuable information to your health care provider. The images from an echocardiogram can provide information such as:  Evidence of coronary artery disease (CAD).  Heart size.  Heart muscle function.  Heart valve function.  Aneurysm detection.  Evidence of a past heart attack.  Fluid buildup around the heart.  Heart muscle thickening.  Assess heart valve function. LET [facility] CARE PROVIDER KNOW ABOUT:  Any allergies you have.  All medicines you are taking, including vitamins, herbs, eye drops, creams, and over-the-counter medicines.  Previous problems you or members of your family have had with the use of anesthetics.  Any blood disorders you have.  Previous surgeries you have had.  Medical conditions you have.  Possibility of pregnancy, if this applies. BEFORE THE PROCEDURE  No special preparation is needed. Eat and drink normally.  PROCEDURE   In order to produce an image of your heart, gel will be applied to your chest and a wand-like tool (transducer) will be moved over your chest. The gel will help transmit the sound waves from the transducer. The sound waves will harmlessly bounce off your heart to allow the heart images to be captured in real-time motion. These images will then be recorded.  You may need an IV to receive a medicine that improves the quality of the pictures. AFTER THE PROCEDURE You may return to your normal schedule including diet, activities, and medicines, unless your health care provider tells you otherwise.   This information is not intended to replace advice given to you by your health care provider. Make sure you discuss any questions you have with your health care provider.   Document Released: 06/15/2000 Document Revised: 07/09/2014 Document Reviewed: 02/23/2013 Elsevier Interactive Patient Education 2016  [employer].  Exercise Stress Echocardiogram An exercise stress echocardiogram is a heart (cardiac)  test used to check the function of your heart. This test may also be called an exercise stress echocardiography or stress echo. This stress test will check how well your heart muscle and valves are working and determine if your heart muscle is getting enough blood. You will exercise on a treadmill to naturally increase or stress the functioning of your heart.  An echocardiogram uses sound waves (ultrasound) to produce an image of your heart. If your heart does not work normally, it may indicate coronary artery disease with poor coronary blood supply. The coronary arteries are the arteries that bring blood and oxygen to your heart. LET [facility] CARE PROVIDER KNOW ABOUT:  Any allergies you have.  All medicines you are taking, including vitamins, herbs, eye drops, creams, and over-the-counter medicines.  Previous problems you or members of your family have had with the use of anesthetics.  Any blood disorders you have.  Previous surgeries you have had.  Medical conditions you have.  Possibility of pregnancy, if this applies. RISKS AND COMPLICATIONS Generally, this is a safe procedure. However, as with any procedure, complications can occur. Possible complications can include:  You develop pain or pressure in the following areas:  Chest.  Jaw or neck.  Between your shoulder blades.  Radiating down your left arm.  Dizziness or lightheadedness.  Shortness of breath.  Increased or irregular heartbeat.  Nausea or vomiting.  Heart attack (rare). BEFORE THE PROCEDURE  Avoid all forms of caffeine for 24 hours before your test or as directed by your health care provider. This includes coffee, tea (even decaffeinated tea), caffeinated sodas, chocolate, cocoa, and certain pain medicines.  Follow your health care provider's instructions regarding eating and drinking before the  test.  Take your medicines as directed at regular times with water unless instructed otherwise. Exceptions may include:  If you have diabetes, ask how you are to take your insulin or pills. It is common to adjust insulin dosing the morning of the test.  If you are taking beta-blocker medicines, it is important to talk to your health care provider about these medicines well before the date of your test. Taking beta-blocker medicines may interfere with the test. In some cases, these medicines need to be changed or stopped 24 hours or more before the test.  If you wear a nitroglycerin patch, it may need to be removed prior to the test. Ask your health care provider if the patch should be removed before the test.  If you use an inhaler for any breathing condition, bring it with you to the test.  If you are an outpatient, bring a snack so you can eat right after the stress phase of the test.  Do not smoke for 4 hours prior to the test or as directed by your health care provider.  Wear loose-fitting clothes and comfortable shoes for the test. This test involves walking on a treadmill. PROCEDURE   Multiple electrodes will be put on your chest. If needed, small areas of your chest may be shaved to get better contact with the electrodes. Once the electrodes are attached to your body, multiple wires will be attached to the electrodes, and your heart rate will be monitored.  You will have an echocardiogram done at rest.  To produce this image of your heart, gel is applied to your chest, and a wand-like tool (transducer) is moved over the chest. The transducer sends the sound waves through the chest to create the moving images of your heart.  You may need an IV to receive a medication that improves the quality of the pictures.  You will then walk on a treadmill. The treadmill will be started at a slow pace. The treadmill speed and incline will gradually be increased to raise your heart rate.  At the  peak of exercise, the treadmill will be stopped. You will lie down immediately on a bed so that a second echocardiogram can be done to visualize your heart's motion with exercise.  The test usually takes 30-60 minutes to complete. AFTER THE PROCEDURE  Your heart rate and blood pressure will be monitored after the test.  You may return to your normal schedule, including diet, activities, and medicines, unless your health care provider tells you otherwise.   This information is not intended to replace advice given to you by your health care provider. Make sure you discuss any questions you have with your health care provider.   Document Released: 06/22/2004 Document Revised: 06/23/2013 Document Reviewed: 02/23/2013 Elsevier Interactive Patient Education 2016 Elsevier Inc.  Holter Monitoring A Holter monitor is a small device that is used to detect abnormal heart rhythms. It clips to your clothing and is connected by wires to flat, sticky disks (electrodes) that attach to your chest. It is worn continuously for 24-48 hours. HOME CARE INSTRUCTIONS  Wear your Holter monitor at all times, even while exercising and sleeping, for as long as directed by your health care provider.  Make sure that the Holter monitor is safely clipped to your clothing or close to your body as recommended by your health care provider.  Do not get the monitor or wires wet.  Do not put body lotion or moisturizer on your chest.  Keep your skin clean.  Keep a diary of your daily activities, such as walking and doing chores. If you feel that your heartbeat is abnormal or that your heart is fluttering or skipping a beat:  Record what you are doing when it happens.  Record what time of day the symptoms occur.  Return your Holter monitor as directed by your health care provider.  Keep all follow-up visits as directed by your health care provider. This is important. SEEK IMMEDIATE MEDICAL CARE IF:  You feel  lightheaded or you faint.  You have trouble breathing.  You feel pain in your chest, upper arm, or jaw.  You feel sick to your stomach and your skin is pale, cool, or damp.  You heartbeat feels unusual or abnormal.   This information is not intended to replace advice given to you by your health care provider. Make sure you discuss any questions you have with your health care provider.   Document Released: 03/16/2004 Document Revised: 07/09/2014 Document Reviewed: 01/25/2014 Elsevier Interactive Patient Education [employer]. 2

## 2015-10-25 ENCOUNTER — Ambulatory Visit: Payer: 59 | Admitting: Family Medicine

## 2015-11-02 ENCOUNTER — Emergency Department
Admission: EM | Admit: 2015-11-02 | Discharge: 2015-11-02 | Disposition: A | Discharge status: Home / Self Care | Payer: Commercial Managed Care - PPO | Attending: Emergency Medicine | Admitting: Emergency Medicine

## 2015-11-02 ENCOUNTER — Encounter: Payer: Self-pay | Admitting: *Deleted

## 2015-11-02 DIAGNOSIS — R197 Diarrhea, unspecified: Secondary | ICD-10-CM | POA: Insufficient documentation

## 2015-11-02 DIAGNOSIS — R509 Fever, unspecified: Secondary | ICD-10-CM | POA: Diagnosis not present

## 2015-11-02 DIAGNOSIS — R110 Nausea: Secondary | ICD-10-CM | POA: Insufficient documentation

## 2015-11-02 DIAGNOSIS — R112 Nausea with vomiting, unspecified: Secondary | ICD-10-CM

## 2015-11-02 DIAGNOSIS — R1030 Lower abdominal pain, unspecified: Secondary | ICD-10-CM | POA: Insufficient documentation

## 2015-11-02 LAB — CBC
HEMATOCRIT: 42.6 % (ref 35.0–47.0)
Hemoglobin: 14.8 g/dL (ref 12.0–16.0)
MCH: 31.2 pg (ref 26.0–34.0)
MCHC: 34.8 g/dL (ref 32.0–36.0)
MCV: 89.7 fL (ref 80.0–100.0)
Platelets: 236 10*3/uL (ref 150–440)
RBC: 4.75 MIL/uL (ref 3.80–5.20)
RDW: 12.3 % (ref 11.5–14.5)
WBC: 9.6 10*3/uL (ref 3.6–11.0)

## 2015-11-02 LAB — URINALYSIS COMPLETE WITH MICROSCOPIC (ARMC ONLY)
BACTERIA UA: NONE SEEN
Bilirubin Urine: NEGATIVE
GLUCOSE, UA: NEGATIVE mg/dL
Hgb urine dipstick: NEGATIVE
Leukocytes, UA: NEGATIVE
NITRITE: NEGATIVE
PROTEIN: 30 mg/dL — AB
SPECIFIC GRAVITY, URINE: 1.031 — AB (ref 1.005–1.030)
pH: 5 (ref 5.0–8.0)

## 2015-11-02 LAB — COMPREHENSIVE METABOLIC PANEL
ALT: 15 U/L (ref 14–54)
ANION GAP: 11 (ref 5–15)
AST: 27 U/L (ref 15–41)
Albumin: 4.5 g/dL (ref 3.5–5.0)
Alkaline Phosphatase: 68 U/L (ref 38–126)
BUN: 13 mg/dL (ref 6–20)
CHLORIDE: 102 mmol/L (ref 101–111)
CO2: 19 mmol/L — ABNORMAL LOW (ref 22–32)
CREATININE: 0.67 mg/dL (ref 0.44–1.00)
Calcium: 8.8 mg/dL — ABNORMAL LOW (ref 8.9–10.3)
GFR calc Af Amer: >60 mL/min (ref >60)
Glucose, Bld: 101 mg/dL — ABNORMAL HIGH (ref 65–99)
Potassium: 3.9 mmol/L (ref 3.5–5.1)
Sodium: 132 mmol/L — ABNORMAL LOW (ref 135–145)
TOTAL PROTEIN: 7.9 g/dL (ref 6.5–8.1)
Total Bilirubin: 1 mg/dL (ref 0.3–1.2)

## 2015-11-02 LAB — POC URINE PREG, ED: PREG TEST UR: NEGATIVE

## 2015-11-02 LAB — LIPASE, BLOOD: LIPASE: 28 U/L (ref 11–51)

## 2015-11-02 MED ORDER — ONDANSETRON 4 MG PO TBDP: 4.0000 mg | ORAL_TABLET | Freq: Once | ORAL | Status: DC | PRN

## 2015-11-02 MED ORDER — ACETAMINOPHEN 325 MG PO TABS
650.0000 mg | ORAL_TABLET | Freq: Once | ORAL | Status: AC | PRN
  Administered 2015-11-02: 650 mg via ORAL
  Filled 2015-11-02: qty 2

## 2015-11-02 MED ORDER — ONDANSETRON HCL 4 MG/2ML IJ SOLN
INTRAMUSCULAR | Status: AC
  Administered 2015-11-02: 4 mg via INTRAVENOUS
  Filled 2015-11-02: qty 2

## 2015-11-02 MED ORDER — SODIUM CHLORIDE 0.9 % IV BOLUS (SEPSIS)
1000.0000 mL | Freq: Once | INTRAVENOUS | Status: AC
  Administered 2015-11-02: 1000 mL via INTRAVENOUS

## 2015-11-02 MED ORDER — ONDANSETRON 4 MG PO TBDP: 4.0000 mg | ORAL_TABLET | Freq: Three times a day (TID) | ORAL | Status: DC | PRN

## 2015-11-02 MED ORDER — ONDANSETRON HCL 4 MG/2ML IJ SOLN
4.0000 mg | Freq: Once | INTRAMUSCULAR | Status: AC
  Administered 2015-11-02: 4 mg via INTRAVENOUS
  Filled 2015-11-02: qty 2

## 2015-11-02 NOTE — ED Notes (Signed)
Patient resting on bed with phone. NAD noted. Explained that we were waiting on the Dr to read lab results. No needs expressed at this time.

## 2015-11-02 NOTE — Discharge Instructions (Signed)
You have been seen in the emergency department for nausea, vomiting, diarrhea, abdominal pain and fever. Currently your labs are normal. However as we discussed if your pain worsens, or your symptoms do not improve over the next 24 hours please return to the emergency department for further evaluation and possible CT scan. Please follow-up with your doctor as soon as possible for recheck/reevaluation. Please use your prescribed nausea medication as needed, as written.   Nausea and Vomiting Nausea means you feel sick to your stomach. Throwing up (vomiting) is a reflex where stomach contents come out of your mouth. HOME CARE   Take medicine as told by your doctor.  Do not force yourself to eat. However, you do need to drink fluids.  If you feel like eating, eat a normal diet as told by your doctor.  Eat rice, wheat, potatoes, bread, lean meats, yogurt, fruits, and vegetables.  Avoid high-fat foods.  Drink enough fluids to keep your pee (urine) clear or pale yellow.  Ask your doctor how to replace body fluid losses (rehydrate). Signs of body fluid loss (dehydration) include:  Feeling very thirsty.  Dry lips and mouth.  Feeling dizzy.  Dark pee.  Peeing less than normal.  Feeling confused.  Fast breathing or heart rate. GET HELP RIGHT AWAY IF:   You have blood in your throw up.  You have black or bloody poop (stool).  You have a bad headache or stiff neck.  You feel confused.  You have bad belly (abdominal) pain.  You have chest pain or trouble breathing.  You do not pee at least once every 8 hours.  You have cold, clammy skin.  You keep throwing up after 24 to 48 hours.  You have a fever. MAKE SURE YOU:   Understand these instructions.  Will watch your condition.  Will get help right away if you are not doing well or get worse.   This information is not intended to replace advice given to you by your health care provider. Make sure you discuss any questions  you have with your health care provider.   Document Released: 12/05/2007 Document Revised: 09/10/2011 Document Reviewed: 11/17/2010 Elsevier Interactive Patient Education 2016 [place].  Diarrhea Diarrhea is frequent loose and watery bowel movements. It can cause you to feel weak and dehydrated. Dehydration can cause you to become tired and thirsty, have a dry mouth, and have decreased urination that often is dark yellow. Diarrhea is a sign of another problem, most often an infection that will not last long. In most cases, diarrhea typically lasts 2-3 days. However, it can last longer if it is a sign of something more serious. It is important to treat your diarrhea as directed by your caregiver to lessen or prevent future episodes of diarrhea. CAUSES  Some common causes include:  Gastrointestinal infections caused by viruses, bacteria, or parasites.  Food poisoning or food allergies.  Certain medicines, such as antibiotics, chemotherapy, and laxatives.  Artificial sweeteners and fructose.  Digestive disorders. HOME CARE INSTRUCTIONS  Ensure adequate fluid intake (hydration): Have 1 cup (8 oz) of fluid for each diarrhea episode. Avoid fluids that contain simple sugars or sports drinks, fruit juices, whole milk products, and sodas. Your urine should be clear or pale yellow if you are drinking enough fluids. Hydrate with an oral rehydration solution that you can purchase at pharmacies, retail stores, and online. You can prepare an oral rehydration solution at home by mixing the following ingredients together:   - tsp table  salt.   tsp baking soda.   tsp salt substitute containing potassium chloride.  1  tablespoons sugar.  1 L (34 oz) of water.  Certain foods and beverages may increase the speed at which food moves through the gastrointestinal (GI) tract. These foods and beverages should be avoided and include:  Caffeinated and alcoholic beverages.  High-fiber foods, such as  raw fruits and vegetables, nuts, seeds, and whole grain breads and cereals.  Foods and beverages sweetened with sugar alcohols, such as xylitol, sorbitol, and mannitol.  Some foods may be well tolerated and may help thicken stool including:  Starchy foods, such as rice, toast, pasta, low-sugar cereal, oatmeal, grits, baked potatoes, crackers, and bagels.  Bananas.  Applesauce.  Add probiotic-rich foods to help increase healthy bacteria in the GI tract, such as yogurt and fermented milk products.  Wash your hands well after each diarrhea episode.  Only take over-the-counter or prescription medicines as directed by your caregiver.  Take a warm bath to relieve any burning or pain from frequent diarrhea episodes. SEEK IMMEDIATE MEDICAL CARE IF:   You are unable to keep fluids down.  You have persistent vomiting.  You have blood in your stool, or your stools are black and tarry.  You do not urinate in 6-8 hours, or there is only a small amount of very dark urine.  You have abdominal pain that increases or localizes.  You have weakness, dizziness, confusion, or light-headedness.  You have a severe headache.  Your diarrhea gets worse or does not get better.  You have a fever or persistent symptoms for more than 2-3 days.  You have a fever and your symptoms suddenly get worse. MAKE SURE YOU:   Understand these instructions.  Will watch your condition.  Will get help right away if you are not doing well or get worse.   This information is not intended to replace advice given to you by your health care provider. Make sure you discuss any questions you have with your health care provider.   Document Released: 06/08/2002 Document Revised: 07/09/2014 Document Reviewed: 02/24/2012 Elsevier Interactive Patient Education [employer].

## 2015-11-02 NOTE — ED Notes (Signed)
States abd pain, lower, generalized body aches and vomiting that began yesterday

## 2015-11-02 NOTE — ED Provider Notes (Signed)
[facility] Emergency Department Provider Note  Time seen: 2:49 PM  I have reviewed the triage vital signs and the nursing notes.   HISTORY  Chief Complaint Abdominal Pain; Emesis; and Generalized Body Aches    HPI Gabrielle Jackson is a 25 y.o. female with no past medical history who presents the emergency department nausea, vomiting, body aches and fever. According to the patient since yesterday she has had generalized body aches, nausea, vomiting and diarrhea. Denies any black or bloody stool. Denies any vaginal discharge or bleeding. Denies any dysuria or hematuria. States her daughter had nausea, vomiting, diarrhea and fever 3 days ago, but is better now.Patient states she is unable to eat anything today so she came to the emergency department for evaluation. Patient does state some mild lower abdominal pain, states it feels like cramping.     History reviewed. No pertinent past medical history.  Patient Active Problem List   Diagnosis Date Noted  . Anxiety disorder 09/05/2015  . Female pelvic pain   . Right lower quadrant abdominal pain   . Partial hamstring tear 05/02/2015    History reviewed. No pertinent past surgical history.  Current Outpatient Rx  Name  Route  Sig  Dispense  Refill  . venlafaxine XR (EFFEXOR XR) 75 MG 24 hr capsule   Oral   Take 1 capsule (75 mg total) by mouth daily with breakfast.   30 capsule   1     Allergies Penicillins  Family History  Problem Relation Age of Onset  . Hypertension Father   . COPD Maternal Grandfather   . COPD Paternal Grandmother     Social History Social History  Substance Use Topics  . Smoking status: Never Smoker   . Smokeless tobacco: Never Used  . Alcohol Use: 0.0 oz/week    0 Standard drinks or equivalent per week     Comment: On occasion    Review of Systems Constitutional: Positive for fever Cardiovascular: Negative for chest pain. Respiratory: Negative for shortness  of breath. Denies cough or congestion. Gastrointestinal: Mild lower abdominal pain. Positive for nausea, vomiting, diarrhea. Genitourinary: Negative for dysuria. Musculoskeletal: Negative for back pain. Neurological: Negative for headache 10-point ROS otherwise negative.  ____________________________________________   PHYSICAL EXAM:  VITAL SIGNS: ED Triage Vitals  Enc Vitals Group     BP 11/02/15 1205 102/67 mmHg     Pulse Rate 11/02/15 1205 111     Resp 11/02/15 1205 18     Temp 11/02/15 1205 102.2 F (39 C)     Temp Source 11/02/15 1205 Oral     SpO2 11/02/15 1205 97 %     Weight 11/02/15 1205 140 lb (63.504 kg)     Height 11/02/15 1205 5\' 7"  (1.702 m)     Head Cir --      Peak Flow --      Pain Score 11/02/15 1205 7     Pain Loc --      Pain Edu? --      Excl. in [place]? --     Constitutional: Alert and oriented. Well appearing and in no distress. Eyes: Normal exam ENT   Head: Normocephalic and atraumatic.   Mouth/Throat: Mucous membranes are moist. Cardiovascular: Normal rate, regular rhythm. No murmur Respiratory: Normal respiratory effort without tachypnea nor retractions. Breath sounds are clear Gastrointestinal: Soft, minimal suprapubic tenderness to palpation otherwise normal exam, no rebound or guarding. No distention. No CVA tenderness. Musculoskeletal: Nontender with normal range of motion in  all extremities.  Neurologic:  Normal speech and language. No gross focal neurologic deficits Skin:  Skin is warm, dry and intact.  Psychiatric: Mood and affect are normal.   ____________________________________________    INITIAL IMPRESSION / ASSESSMENT AND PLAN / ED COURSE  Pertinent labs & imaging results that were available during my care of the patient were reviewed by me and considered in my medical decision making (see chart for details).  Patient presents the emergency department with some lower abdominal pain, diffuse body aches, nausea, vomiting,  diarrhea and fever. A chaperone in the emergency department. Denies any cough or congestion. Denies headache or neck pain. Patient states her daughter had similar symptoms along with fever 3 days ago but is better now. Patient states she strongly believes that she has a GI bug however she could not keep anything down today so she came to the emergency department for evaluation.  Patient's blood work is largely within normal limits. Normal white blood cell count currently. Urinalysis pending.  Labs including urinalysis are normal. Patient states she is feeling much better. I discussed the options with the patient as far as proceeding with CT scan now, the patient states she is feeling better, she really thinks this is a viral bug as her daughter had similar symptoms. I discussed with the patient a trial of Zofran, if she does not improve within 24 hours or the pain worsens at any point she is to return to the emergency department for reevaluation likely CT scan.  ____________________________________________   FINAL CLINICAL IMPRESSION(S) / ED DIAGNOSES  Nausea vomiting diarrhea fever   Harvest Dark, MD 11/02/15 1620

## 2015-11-02 NOTE — ED Notes (Addendum)
POCT Pregnancy test done with NEGATIVE result. Unable to enter result into glucometer at this time. Results entered manually into Epic.

## 2015-11-03 ENCOUNTER — Encounter: Payer: Self-pay | Admitting: Emergency Medicine

## 2015-11-03 ENCOUNTER — Emergency Department: Payer: Commercial Managed Care - PPO

## 2015-11-03 ENCOUNTER — Telehealth: Payer: Self-pay | Admitting: Family Medicine

## 2015-11-03 ENCOUNTER — Emergency Department
Admission: EM | Admit: 2015-11-03 | Discharge: 2015-11-03 | Disposition: A | Discharge status: Home / Self Care | Payer: Commercial Managed Care - PPO | Attending: Emergency Medicine | Admitting: Emergency Medicine

## 2015-11-03 DIAGNOSIS — R1031 Right lower quadrant pain: Secondary | ICD-10-CM | POA: Diagnosis present

## 2015-11-03 DIAGNOSIS — N83201 Unspecified ovarian cyst, right side: Secondary | ICD-10-CM

## 2015-11-03 DIAGNOSIS — N83291 Other ovarian cyst, right side: Secondary | ICD-10-CM | POA: Insufficient documentation

## 2015-11-03 DIAGNOSIS — K529 Noninfective gastroenteritis and colitis, unspecified: Secondary | ICD-10-CM

## 2015-11-03 DIAGNOSIS — R197 Diarrhea, unspecified: Secondary | ICD-10-CM

## 2015-11-03 LAB — CBC WITH DIFFERENTIAL/PLATELET
BASOS ABS: 0 10*3/uL (ref 0–0.1)
Basophils Relative: 0 %
EOS PCT: 2 %
Eosinophils Absolute: 0.1 10*3/uL (ref 0–0.7)
HCT: 43.2 % (ref 35.0–47.0)
Hemoglobin: 14.8 g/dL (ref 12.0–16.0)
LYMPHS PCT: 23 %
Lymphs Abs: 1.2 10*3/uL (ref 1.0–3.6)
MCH: 31.5 pg (ref 26.0–34.0)
MCHC: 34.2 g/dL (ref 32.0–36.0)
MCV: 92 fL (ref 80.0–100.0)
Monocytes Absolute: 0.8 10*3/uL (ref 0.2–0.9)
Monocytes Relative: 16 %
Neutro Abs: 3.1 10*3/uL (ref 1.4–6.5)
Neutrophils Relative %: 59 %
PLATELETS: 194 10*3/uL (ref 150–440)
RBC: 4.69 MIL/uL (ref 3.80–5.20)
RDW: 12.3 % (ref 11.5–14.5)
WBC: 5.3 10*3/uL (ref 3.6–11.0)

## 2015-11-03 LAB — COMPREHENSIVE METABOLIC PANEL
ALBUMIN: 4.3 g/dL (ref 3.5–5.0)
ALK PHOS: 62 U/L (ref 38–126)
ALT: 17 U/L (ref 14–54)
AST: 28 U/L (ref 15–41)
Anion gap: 8 (ref 5–15)
BILIRUBIN TOTAL: 0.7 mg/dL (ref 0.3–1.2)
BUN: 13 mg/dL (ref 6–20)
CALCIUM: 8.8 mg/dL — AB (ref 8.9–10.3)
CO2: 22 mmol/L (ref 22–32)
Chloride: 108 mmol/L (ref 101–111)
Creatinine, Ser: 0.78 mg/dL (ref 0.44–1.00)
GFR calc Af Amer: >60 mL/min (ref >60)
GLUCOSE: 82 mg/dL (ref 65–99)
Potassium: 3.7 mmol/L (ref 3.5–5.1)
Sodium: 138 mmol/L (ref 135–145)
TOTAL PROTEIN: 7.5 g/dL (ref 6.5–8.1)

## 2015-11-03 LAB — LIPASE, BLOOD: LIPASE: 34 U/L (ref 11–51)

## 2015-11-03 MED ORDER — ONDANSETRON HCL 4 MG/2ML IJ SOLN
INTRAMUSCULAR | Status: AC
  Administered 2015-11-03: 4 mg via INTRAVENOUS
  Filled 2015-11-03: qty 2

## 2015-11-03 MED ORDER — ACETAMINOPHEN 500 MG PO TABS
ORAL_TABLET | ORAL | Status: AC
  Filled 2015-11-03: qty 2

## 2015-11-03 MED ORDER — SODIUM CHLORIDE 0.9 % IV BOLUS (SEPSIS)
1000.0000 mL | Freq: Once | INTRAVENOUS | Status: AC
  Administered 2015-11-03: 1000 mL via INTRAVENOUS

## 2015-11-03 MED ORDER — HYDROCODONE-ACETAMINOPHEN 5-325 MG PO TABS: 1.0000 | ORAL_TABLET | Freq: Four times a day (QID) | ORAL | Status: DC | PRN

## 2015-11-03 MED ORDER — IOPAMIDOL (ISOVUE-300) INJECTION 61%
100.0000 mL | Freq: Once | INTRAVENOUS | Status: AC | PRN
  Administered 2015-11-03: 100 mL via INTRAVENOUS
  Filled 2015-11-03: qty 100

## 2015-11-03 MED ORDER — ACETAMINOPHEN 500 MG PO TABS
1000.0000 mg | ORAL_TABLET | ORAL | Status: AC
  Administered 2015-11-03: 1000 mg via ORAL

## 2015-11-03 MED ORDER — DIATRIZOATE MEGLUMINE & SODIUM 66-10 % PO SOLN
15.0000 mL | ORAL | Status: AC
  Administered 2015-11-03: 15 mL via ORAL

## 2015-11-03 MED ORDER — ONDANSETRON HCL 4 MG/2ML IJ SOLN
4.0000 mg | Freq: Once | INTRAMUSCULAR | Status: AC
  Administered 2015-11-03: 4 mg via INTRAVENOUS

## 2015-11-03 NOTE — ED Notes (Signed)
Presents with abd pain for the past 3 days with diarrhea

## 2015-11-03 NOTE — Telephone Encounter (Signed)
Per the notes at the ER, she should return for a CT scan. If she is not better and especially if she is worse she should do that. If it ends up being negative, we can have her do some stool studies to see if it's a bacterial infection- she can stop by to pick up the stool study kit if she would like, but I would recommend she get checked out to make sure she doesn't have appendicitis or anything else.

## 2015-11-03 NOTE — Telephone Encounter (Signed)
Patient states that she is probably going to go back to the ER. She will do the stool studies also.

## 2015-11-03 NOTE — ED Notes (Signed)
Pt seen here yesterday with lower abdominal pain, nausea, vomiting and diarrhea. Told to return if not any better.

## 2015-11-03 NOTE — Discharge Instructions (Signed)

## 2015-11-03 NOTE — ED Provider Notes (Signed)
[facility] Emergency Department Provider Note  ____________________________________________  Time seen: Approximately 8:00 PM  I have reviewed the triage vital signs and the nursing notes.   HISTORY  Chief Complaint Abdominal Pain    HPI Gabrielle Jackson is a 25 y.o. female presents today for evaluation of right lower abdominal pain.  Patient reports that she's been having nausea vomiting and diarrhea for the last 3 days. Her daughter was sick with similar on Monday. She reports her nausea and vomiting are somewhat improving, and the diarrhea is improving however she's had persistent pain for about 2 days in the right lower abdomen.  She reports some moderate sharp discomfort in the right lower abdomen.     History reviewed. No pertinent past medical history.  Patient Active Problem List   Diagnosis Date Noted  . Anxiety disorder 09/05/2015  . Female pelvic pain   . Right lower quadrant abdominal pain   . Partial hamstring tear 05/02/2015    History reviewed. No pertinent past surgical history.  Current Outpatient Rx  Name  Route  Sig  Dispense  Refill  . ondansetron (ZOFRAN ODT) 4 MG disintegrating tablet   Oral   Take 1 tablet (4 mg total) by mouth every 8 (eight) hours as needed for nausea or vomiting.   20 tablet   0   . venlafaxine XR (EFFEXOR XR) 75 MG 24 hr capsule   Oral   Take 1 capsule (75 mg total) by mouth daily with breakfast.   30 capsule   1   . HYDROcodone-acetaminophen (NORCO/VICODIN) 5-325 MG tablet   Oral   Take 1 tablet by mouth every 6 (six) hours as needed for moderate pain.   15 tablet   0     Allergies Penicillins  Family History  Problem Relation Age of Onset  . Hypertension Father   . COPD Maternal Grandfather   . COPD Paternal Grandmother     Social History Social History  Substance Use Topics  . Smoking status: Never Smoker   . Smokeless tobacco: Never Used  . Alcohol Use: 0.0 oz/week     0 Standard drinks or equivalent per week     Comment: On occasion    Review of Systems Constitutional: No fever/chills Eyes: No visual changes. ENT: No sore throat. Cardiovascular: Denies chest pain. Respiratory: Denies shortness of breath. Gastrointestinal:   No constipation. Genitourinary: Negative for dysuria. Musculoskeletal: Negative for back pain. Skin: Negative for rash. Neurological: Negative for headaches, focal weakness or numbness.  Denies any vaginal discharge, no pelvic or vaginal discomfort.  10-point ROS otherwise negative.  ____________________________________________   PHYSICAL EXAM:  VITAL SIGNS: ED Triage Vitals  Enc Vitals Group     BP 11/03/15 1842 121/68 mmHg     Pulse Rate 11/03/15 1842 69     Resp 11/03/15 1842 16     Temp 11/03/15 1842 97.5 F (36.4 C)     Temp Source 11/03/15 1842 Oral     SpO2 11/03/15 1842 100 %     Weight 11/03/15 1842 140 lb (63.504 kg)     Height 11/03/15 1842 5\' 7"  (1.702 m)     Head Cir --      Peak Flow --      Pain Score 11/03/15 1843 7     Pain Loc --      Pain Edu? --      Excl. in [place]? --    Constitutional: Alert and oriented. Well appearing and in no  acute distress.Very pleasant. Eyes: Conjunctivae are normal. PERRL. EOMI. Head: Atraumatic. Nose: No congestion/rhinnorhea. Mouth/Throat: Mucous membranes are moist.  Oropharynx non-erythematous. Neck: No stridor.   Cardiovascular: Normal rate, regular rhythm. Grossly normal heart sounds.  Good peripheral circulation. Respiratory: Normal respiratory effort.  No retractions. Lungs CTAB. Gastrointestinal: Soft and nontender except for some moderate discomfort focally in the right lower quadrant, approximately around McBurney's point. There is no rebound, guarding, or evidence of peritonitis. No distention. No abdominal bruits. No CVA tenderness. Musculoskeletal: No lower extremity tenderness nor edema.  No joint effusions. Neurologic:  Normal speech and language.  No gross focal neurologic deficits are appreciated. No gait instability. Skin:  Skin is warm, dry and intact. No rash noted. Psychiatric: Mood and affect are normal. Speech and behavior are normal.  ____________________________________________   LABS (all labs ordered are listed, but only abnormal results are displayed)  Labs Reviewed  COMPREHENSIVE METABOLIC PANEL - Abnormal; Notable for the following:    Calcium 8.8 (*)    All other components within normal limits  CBC WITH DIFFERENTIAL/PLATELET  LIPASE, BLOOD  URINALYSIS COMPLETEWITH MICROSCOPIC ([facility] ONLY)   ____________________________________________  EKG   ____________________________________________  RADIOLOGY  CT Abdomen Pelvis W Contrast (Final result) Result time: 11/03/15 20:56:35   Final result by Rad Results In Interface (11/03/15 20:56:35)   Narrative:   CLINICAL DATA: Nausea, vomiting, body aches and fever.  EXAM: CT ABDOMEN AND PELVIS WITH CONTRAST  TECHNIQUE: Multidetector CT imaging of the abdomen and pelvis was performed using the standard protocol following bolus administration of intravenous contrast.  CONTRAST: 157mL ISOVUE-300 IOPAMIDOL (ISOVUE-300) INJECTION 61%  COMPARISON: CT abdomen pelvis - 07/28/2015; pelvic ultrasound - 07/28/2015  FINDINGS: Lower chest: Limited visualization of the lower thorax is negative for focal airspace opacity or pleural effusion. Punctate (approximately 0.3 cm) nodule within the imaged left lower lobe (image 2, series 4) is unchanged in favored is sequela of prior infection or and/or inflammation. No focal airspace opacities. No pleural effusion. Normal heart size. No pericardial effusion.  Hepatobiliary: Normal hepatic contour. There is a minimal amount of focal fatty infiltration adjacent to the fissure for ligamentum teres. No discrete hepatic lesions. Normal appearance of the gallbladder given degree distention. No radiopaque gallstones.  No intra or extrahepatic biliary ductal dilatation. No ascites.  Pancreas: Normal appearance of the pancreas.  Spleen: Normal appearance of the spleen.  Adrenals/Urinary Tract: There is symmetric enhancement of the bilateral kidneys. No definite renal stones on this postcontrast examination. No discrete renal lesions. No urinary obstruction or perinephric stranding.  Normal appearance of the bilateral adrenal glands. Normal appearance of the urinary bladder given degree distention.  Stomach/Bowel: Ingested enteric contrast extends to the level of the distal small bowel. The bowel is normal in course and caliber without wall thickening or evidence of enteric obstruction. Normal appearance of the terminal ileum. The appendix is not visualized, however there is no pericecal inflammatory change. No pneumoperitoneum, pneumatosis or portal venous gas.  Vascular/Lymphatic: Normal caliber the abdominal aorta. The major branch vessels of the abdominal aorta appear widely patent on this non CTA examination.  Scattered mesenteric lymph nodes are numerous though individually not enlarged by size criteria with index mesenteric lymph node within in the left mid hemi abdomen measuring 0.8 cm in greatest short axis diameter (image 32, series 2 additional index mesenteric node within the more caudal aspect of the left side of the abdominal mesenteric measuring 0.8 cm (image 40, series 2 and index mesenteric lymph node within the right  lower abdominal quadrant measuring 0.7 cm (image 44). No bulky retroperitoneal, mesenteric, pelvic or inguinal lymphadenopathy.  Reproductive: Note is made of a approximately 1.9 x 1.5 cm suspected right-sided hemorrhagic ovarian cyst. No discrete left-sided adnexal lesions. There is a small amount of physiologic fluid within the pelvic cul-de-sac.  Other: Tiny mesenteric fat containing periumbilical hernia.  Musculoskeletal: No acute or aggressive osseous  abnormalities.  IMPRESSION: 1. Approximately 1.9 cm suspected right-sided hemorrhagic cyst with small amount of physiologic fluid within the pelvic cul-de-sac. 2. Scattered mesenteric lymph nodes are numerous though individually not enlarged by size criteria and thus presumably reactive in etiology though potentially could be seen in the setting of mesenteric adenitis. 3. Other than the above 2 findings, there is no explanation for patient's right lower quadrant abdominal pain. Specifically, no evidence of enteric or urinary obstruction. The appendix is not visualized, however there is no pericecal inflammatory change.   Electronically Signed By: Sandi Mariscal M.D. On: 11/03/2015 20:56      US Transvaginal Non-OB (Final result) Result time: 11/03/15 22:33:39   Final result by Rad Results In Interface (11/03/15 22:33:39)   Narrative:   CLINICAL DATA: Right lower quadrant pain for 3 days.  EXAM: TRANSABDOMINAL AND TRANSVAGINAL ULTRASOUND OF PELVIS  DOPPLER ULTRASOUND OF OVARIES  TECHNIQUE: Both transabdominal and transvaginal ultrasound examinations of the pelvis were performed. Transabdominal technique was performed for global imaging of the pelvis including uterus, ovaries, adnexal regions, and pelvic cul-de-sac.  It was necessary to proceed with endovaginal exam following the transabdominal exam to visualize the right ovary. Color and duplex Doppler ultrasound was utilized to evaluate blood flow to the ovaries.  COMPARISON: CT 11/03/2015  FINDINGS: Uterus  Measurements: 6.5 x 3.5 x 4.8 cm. No fibroids or other mass visualized.  Endometrium  Thickness: 7.0 mm. No focal abnormality visualized.  Right ovary  Measurements: 3.7 x 2.5 x 2.6 cm. Normal appearance/no adnexal mass. Probable corpus luteal cyst, corresponding to the CT abnormality.  Left ovary  Measurements: 3.7 x 1.3 x 2.3 cm. Normal appearance/no adnexal mass.  Pulsed Doppler evaluation  of both ovaries demonstrates normal low-resistance arterial and venous waveforms.  Other findings  No abnormal free fluid.  IMPRESSION: Probable right corpus luteal cyst. No significant adnexal abnormalities. No abnormal fluid collections.   Electronically Signed By: Andreas Newport M.D. On: 11/03/2015 22:33          US Pelvis Complete (Final result) Result time: 11/03/15 22:33:39   Final result by Rad Results In Interface (11/03/15 22:33:39)   Narrative:   CLINICAL DATA: Right lower quadrant pain for 3 days.  EXAM: TRANSABDOMINAL AND TRANSVAGINAL ULTRASOUND OF PELVIS  DOPPLER ULTRASOUND OF OVARIES  TECHNIQUE: Both transabdominal and transvaginal ultrasound examinations of the pelvis were performed. Transabdominal technique was performed for global imaging of the pelvis including uterus, ovaries, adnexal regions, and pelvic cul-de-sac.  It was necessary to proceed with endovaginal exam following the transabdominal exam to visualize the right ovary. Color and duplex Doppler ultrasound was utilized to evaluate blood flow to the ovaries.  COMPARISON: CT 11/03/2015  FINDINGS: Uterus  Measurements: 6.5 x 3.5 x 4.8 cm. No fibroids or other mass visualized.  Endometrium  Thickness: 7.0 mm. No focal abnormality visualized.  Right ovary  Measurements: 3.7 x 2.5 x 2.6 cm. Normal appearance/no adnexal mass. Probable corpus luteal cyst, corresponding to the CT abnormality.  Left ovary  Measurements: 3.7 x 1.3 x 2.3 cm. Normal appearance/no adnexal mass.  Pulsed Doppler evaluation of both ovaries demonstrates normal  low-resistance arterial and venous waveforms.  Other findings  No abnormal free fluid.  IMPRESSION: Probable right corpus luteal cyst. No significant adnexal abnormalities. No abnormal fluid collections.   Electronically Signed By: Andreas Newport M.D. On: 11/03/2015 22:33          [place] Art/Ven Flow Abd Pelv Doppler  (Final result) Result time: 11/03/15 22:33:39   Final result by Rad Results In Interface (11/03/15 22:33:39)   Narrative:   CLINICAL DATA: Right lower quadrant pain for 3 days.  EXAM: TRANSABDOMINAL AND TRANSVAGINAL ULTRASOUND OF PELVIS  DOPPLER ULTRASOUND OF OVARIES  TECHNIQUE: Both transabdominal and transvaginal ultrasound examinations of the pelvis were performed. Transabdominal technique was performed for global imaging of the pelvis including uterus, ovaries, adnexal regions, and pelvic cul-de-sac.  It was necessary to proceed with endovaginal exam following the transabdominal exam to visualize the right ovary. Color and duplex Doppler ultrasound was utilized to evaluate blood flow to the ovaries.  COMPARISON: CT 11/03/2015  FINDINGS: Uterus  Measurements: 6.5 x 3.5 x 4.8 cm. No fibroids or other mass visualized.  Endometrium  Thickness: 7.0 mm. No focal abnormality visualized.  Right ovary  Measurements: 3.7 x 2.5 x 2.6 cm. Normal appearance/no adnexal mass. Probable corpus luteal cyst, corresponding to the CT abnormality.  Left ovary  Measurements: 3.7 x 1.3 x 2.3 cm. Normal appearance/no adnexal mass.  Pulsed Doppler evaluation of both ovaries demonstrates normal low-resistance arterial and venous waveforms.  Other findings  No abnormal free fluid.  IMPRESSION: Probable right corpus luteal cyst. No significant adnexal abnormalities. No abnormal fluid collections.   Electronically Signed By: Andreas Newport M.D. On: 11/03/2015 22:33          CT Abdomen Pelvis W Contrast (Final result) Result time: 11/03/15 20:56:35   Final result by Rad Results In Interface (11/03/15 20:56:35)   Narrative:   CLINICAL DATA: Nausea, vomiting, body aches and fever.  EXAM: CT ABDOMEN AND PELVIS WITH CONTRAST  TECHNIQUE: Multidetector CT imaging of the abdomen and pelvis was performed using the standard protocol following bolus administration  of intravenous contrast.  CONTRAST: 188mL ISOVUE-300 IOPAMIDOL (ISOVUE-300) INJECTION 61%  COMPARISON: CT abdomen pelvis - 07/28/2015; pelvic ultrasound - 07/28/2015  FINDINGS: Lower chest: Limited visualization of the lower thorax is negative for focal airspace opacity or pleural effusion. Punctate (approximately 0.3 cm) nodule within the imaged left lower lobe (image 2, series 4) is unchanged in favored is sequela of prior infection or and/or inflammation. No focal airspace opacities. No pleural effusion. Normal heart size. No pericardial effusion.  Hepatobiliary: Normal hepatic contour. There is a minimal amount of focal fatty infiltration adjacent to the fissure for ligamentum teres. No discrete hepatic lesions. Normal appearance of the gallbladder given degree distention. No radiopaque gallstones. No intra or extrahepatic biliary ductal dilatation. No ascites.  Pancreas: Normal appearance of the pancreas.  Spleen: Normal appearance of the spleen.  Adrenals/Urinary Tract: There is symmetric enhancement of the bilateral kidneys. No definite renal stones on this postcontrast examination. No discrete renal lesions. No urinary obstruction or perinephric stranding.  Normal appearance of the bilateral adrenal glands. Normal appearance of the urinary bladder given degree distention.  Stomach/Bowel: Ingested enteric contrast extends to the level of the distal small bowel. The bowel is normal in course and caliber without wall thickening or evidence of enteric obstruction. Normal appearance of the terminal ileum. The appendix is not visualized, however there is no pericecal inflammatory change. No pneumoperitoneum, pneumatosis or portal venous gas.  Vascular/Lymphatic: Normal caliber the  abdominal aorta. The major branch vessels of the abdominal aorta appear widely patent on this non CTA examination.  Scattered mesenteric lymph nodes are numerous though individually not  enlarged by size criteria with index mesenteric lymph node within in the left mid hemi abdomen measuring 0.8 cm in greatest short axis diameter (image 32, series 2 additional index mesenteric node within the more caudal aspect of the left side of the abdominal mesenteric measuring 0.8 cm (image 40, series 2 and index mesenteric lymph node within the right lower abdominal quadrant measuring 0.7 cm (image 44). No bulky retroperitoneal, mesenteric, pelvic or inguinal lymphadenopathy.  Reproductive: Note is made of a approximately 1.9 x 1.5 cm suspected right-sided hemorrhagic ovarian cyst. No discrete left-sided adnexal lesions. There is a small amount of physiologic fluid within the pelvic cul-de-sac.  Other: Tiny mesenteric fat containing periumbilical hernia.  Musculoskeletal: No acute or aggressive osseous abnormalities.  IMPRESSION: 1. Approximately 1.9 cm suspected right-sided hemorrhagic cyst with small amount of physiologic fluid within the pelvic cul-de-sac. 2. Scattered mesenteric lymph nodes are numerous though individually not enlarged by size criteria and thus presumably reactive in etiology though potentially could be seen in the setting of mesenteric adenitis. 3. Other than the above 2 findings, there is no explanation for patient's right lower quadrant abdominal pain. Specifically, no evidence of enteric or urinary obstruction. The appendix is not visualized, however there is no pericecal inflammatory change.   Electronically Signed By: Sandi Mariscal M.D. On: 11/03/2015 20:56       ____________________________________________   PROCEDURES  Procedure(s) performed: None  Critical Care performed: No  ____________________________________________   INITIAL IMPRESSION / ASSESSMENT AND PLAN / ED COURSE  Pertinent labs & imaging results that were available during my care of the patient were reviewed by me and considered in my medical decision making (see  chart for details).  Patient presents for ongoing symptoms of nausea vomiting diarrhea. His symptoms seem to be improving, and she is no longer having fever or body aches. However, she does report that she is having ongoing discomfort in the right lower abdomen for about the last 2 days. She does have mild focal tenderness the right lower quadrant, however no evidence of rebound or guarding or frank peritonitis. She does not have any associated vaginal discharge or bleeding.  Her labs today are reassuring, and she is in no distress. We will obtain CT imaging as she does have focal pain in the right lower abdomen, in the setting of recent GI illness wish to assist exclude appendicitis.  ----------------------------------------- 10:29 PM on 11/03/2015 -----------------------------------------  Patient reports she is feeling better. She was offered additional pain control, but wished only for Tylenol and states that her pain is much better now and she is resting comfortably feeling improved. I did discuss with her, and given she has a right-sided cyst noted in the setting of right lower pelvic pain we will evaluate with ultrasound to assure no evidence of torsion. If no torsion, anticipate discharge to home, and close follow-up with her gynecologist whom she has. He has a prescription for Zofran already, and states she has not yet filled it.  Return precautions and treatment recommendations and follow-up discussed with the patient who is agreeable with the plan.  ____________________________________________   FINAL CLINICAL IMPRESSION(S) / ED DIAGNOSES  Final diagnoses:  Ovarian cyst, right  Gastroenteritis      Delman Kitten, MD 11/03/15 2239

## 2015-11-03 NOTE — Telephone Encounter (Signed)
Pt went to ED yesterday and i still in pain if not worse and she would like a call back with suggestions on what she should do.

## 2015-11-03 NOTE — Telephone Encounter (Signed)
Stool studies up front for her to pick up. Call with concerns.

## 2015-11-11 ENCOUNTER — Other Ambulatory Visit: Payer: Commercial Managed Care - PPO

## 2015-11-21 ENCOUNTER — Telehealth: Payer: Self-pay | Admitting: Cardiology

## 2015-11-21 NOTE — Telephone Encounter (Signed)
Patient is scheduled to fu next week with  Dr. Yvone Neu and has not completed any of the tests that were ordered.    Echo stress echo and holter   Should we cancel fu ?  Patient has cancelled multiple appts for testing .

## 2015-11-21 NOTE — Telephone Encounter (Signed)
Attempted to contact pt regarding rescheduling ordered tests. No answer, VM full. Will call again

## 2015-11-22 NOTE — Telephone Encounter (Signed)
No answer and voicemail is full. Will continue to try and call.

## 2015-11-23 NOTE — Telephone Encounter (Signed)
No answer and voicemail full. Will try to call again.

## 2015-11-24 ENCOUNTER — Encounter: Payer: Self-pay | Admitting: *Deleted

## 2015-11-24 NOTE — Telephone Encounter (Signed)
No answer or voice mail available.

## 2015-11-24 NOTE — Telephone Encounter (Signed)
Attempted to call patient on multiple dates 11/21/15, 11/22/15, 11/23/15, 11/24/15 x2 and unable to reach patient to discuss rescheduling follow up appointment due to incompletion of testing. Will cancel her follow up appointment and send her a letter in the mail with details and instructions to call in to schedule testing and follow up.

## 2015-11-27 ENCOUNTER — Other Ambulatory Visit: Payer: Self-pay | Admitting: Family Medicine

## 2015-11-30 ENCOUNTER — Ambulatory Visit: Payer: Commercial Managed Care - PPO | Admitting: Cardiology

## 2015-12-06 ENCOUNTER — Ambulatory Visit (INDEPENDENT_AMBULATORY_CARE_PROVIDER_SITE_OTHER): Payer: Commercial Managed Care - PPO

## 2015-12-06 DIAGNOSIS — R002 Palpitations: Secondary | ICD-10-CM | POA: Diagnosis not present

## 2015-12-06 DIAGNOSIS — R079 Chest pain, unspecified: Secondary | ICD-10-CM | POA: Diagnosis not present

## 2015-12-14 ENCOUNTER — Ambulatory Visit
Admission: RE | Admit: 2015-12-14 | Discharge: 2015-12-14 | Disposition: A | Discharge status: Home / Self Care | Payer: Commercial Managed Care - PPO | Source: Ambulatory Visit | Attending: Cardiology | Admitting: Cardiology

## 2015-12-14 DIAGNOSIS — R079 Chest pain, unspecified: Secondary | ICD-10-CM | POA: Diagnosis present

## 2015-12-16 ENCOUNTER — Other Ambulatory Visit: Payer: Self-pay | Admitting: Family Medicine

## 2016-02-14 ENCOUNTER — Other Ambulatory Visit: Payer: Self-pay | Admitting: Family Medicine

## 2016-02-16 ENCOUNTER — Encounter: Payer: Self-pay | Admitting: Family Medicine

## 2016-02-16 ENCOUNTER — Other Ambulatory Visit: Payer: Self-pay

## 2016-02-16 ENCOUNTER — Ambulatory Visit (INDEPENDENT_AMBULATORY_CARE_PROVIDER_SITE_OTHER): Payer: Commercial Managed Care - PPO | Admitting: Family Medicine

## 2016-02-16 ENCOUNTER — Ambulatory Visit
Admission: RE | Admit: 2016-02-16 | Discharge: 2016-02-16 | Disposition: A | Discharge status: Home / Self Care | Payer: Commercial Managed Care - PPO | Source: Ambulatory Visit | Attending: Family Medicine | Admitting: Family Medicine

## 2016-02-16 ENCOUNTER — Telehealth: Payer: Self-pay | Admitting: Family Medicine

## 2016-02-16 DIAGNOSIS — N939 Abnormal uterine and vaginal bleeding, unspecified: Secondary | ICD-10-CM | POA: Diagnosis present

## 2016-02-16 DIAGNOSIS — O418X10 Other specified disorders of amniotic fluid and membranes, first trimester, not applicable or unspecified: Secondary | ICD-10-CM

## 2016-02-16 DIAGNOSIS — N926 Irregular menstruation, unspecified: Secondary | ICD-10-CM | POA: Diagnosis not present

## 2016-02-16 DIAGNOSIS — R110 Nausea: Secondary | ICD-10-CM

## 2016-02-16 DIAGNOSIS — O209 Hemorrhage in early pregnancy, unspecified: Secondary | ICD-10-CM | POA: Diagnosis not present

## 2016-02-16 DIAGNOSIS — Z3A01 Less than 8 weeks gestation of pregnancy: Secondary | ICD-10-CM | POA: Diagnosis not present

## 2016-02-16 DIAGNOSIS — O468X1 Other antepartum hemorrhage, first trimester: Principal | ICD-10-CM

## 2016-02-16 DIAGNOSIS — R1031 Right lower quadrant pain: Secondary | ICD-10-CM

## 2016-02-16 DIAGNOSIS — O208 Other hemorrhage in early pregnancy: Secondary | ICD-10-CM | POA: Insufficient documentation

## 2016-02-16 DIAGNOSIS — Z3490 Encounter for supervision of normal pregnancy, unspecified, unspecified trimester: Secondary | ICD-10-CM

## 2016-02-16 LAB — PREGNANCY, URINE: PREG TEST UR: POSITIVE — AB

## 2016-02-16 NOTE — Progress Notes (Signed)
LMP 12/25/2015 (Exact Date) Comment: With spotting starting 02/12/16   Subjective:    Patient ID: Gabrielle Jackson, female    DOB: 02-Jun-1991, 25 y.o.   MRN: [id number]  HPI: Gabrielle Jackson is a 25 y.o. female  Chief Complaint  Patient presents with  . Possible Pregnancy    Patient states that she had some spotting on Monday of last week, not much, she has been feeling sick so she took pregnancy test last night all were positive. She is spotting today with some cramping on left side. Last real period was the end of June.   PREGNANCY DIAGNOSIS- has been having moderate RLQ pain non-radiating and spotting since last week.  Gravida/Para: G2P1 Home pregnancy test: Positive x4 Nausea: yes Vomiting: no Breast tenderness: yes Abdominal pain: yes Vaginal bleeding: yes Pregnancy or labor complications: no Fetal abnormalities: no Contraception: none  Relevant past medical, surgical, family and social history reviewed and updated as indicated. Interim medical history since our last visit reviewed. Allergies and medications reviewed and updated.  Review of Systems  Constitutional: Negative.   Respiratory: Negative.   Cardiovascular: Negative.   Gastrointestinal: Positive for abdominal distention, abdominal pain and nausea. Negative for anal bleeding, blood in stool, constipation, diarrhea, rectal pain and vomiting.  Genitourinary: Positive for pelvic pain and vaginal bleeding. Negative for decreased urine volume, difficulty urinating, dyspareunia, dysuria, enuresis, flank pain, frequency, genital sores, hematuria, menstrual problem, urgency, vaginal discharge and vaginal pain.  Psychiatric/Behavioral: Negative.     Per HPI unless specifically indicated above     Objective:    LMP 12/25/2015 (Exact Date) Comment: With spotting starting 02/12/16  Wt Readings from Last 3 Encounters:  11/03/15 140 lb (63.5 kg)  11/02/15 140 lb (63.5 kg)  10/24/15 140 lb 8 oz (63.7 kg)      Physical Exam  Constitutional: She is oriented to person, place, and time. She appears well-developed and well-nourished. No distress.  HENT:  Head: Normocephalic and atraumatic.  Right Ear: Hearing normal.  Left Ear: Hearing normal.  Nose: Nose normal.  Eyes: Conjunctivae and lids are normal. Right eye exhibits no discharge. Left eye exhibits no discharge. No scleral icterus.  Cardiovascular: Normal rate, regular rhythm, normal heart sounds and intact distal pulses.  Exam reveals no gallop and no friction rub.   No murmur heard. Pulmonary/Chest: Effort normal and breath sounds normal. No respiratory distress. She has no wheezes. She has no rales. She exhibits no tenderness.  Abdominal: Soft. Bowel sounds are normal. She exhibits no distension and no mass. There is tenderness in the right lower quadrant. There is no rigidity, no rebound, no guarding, no tenderness at McBurney's point and negative Murphy's sign.  Musculoskeletal: Normal range of motion.  Neurological: She is alert and oriented to person, place, and time.  Skin: Skin is warm, dry and intact. No rash noted. She is not diaphoretic. No erythema. No pallor.  Psychiatric: She has a normal mood and affect. Her speech is normal and behavior is normal. Judgment and thought content normal. Cognition and memory are normal.  Nursing note and vitals reviewed.  Results for orders placed or performed during the hospital encounter of 11/03/15  CBC with Differential  Result Value Ref Range   WBC 5.3 3.6 - 11.0 K/uL   RBC 4.69 3.80 - 5.20 MIL/uL   Hemoglobin 14.8 12.0 - 16.0 g/dL   HCT 43.2 35.0 - 47.0 %   MCV 92.0 80.0 - 100.0 fL   MCH 31.5 26.0 - 34.0  pg   MCHC 34.2 32.0 - 36.0 g/dL   RDW 12.3 11.5 - 14.5 %   Platelets 194 150 - 440 K/uL   Neutrophils Relative % 59 %   Neutro Abs 3.1 1.4 - 6.5 K/uL   Lymphocytes Relative 23 %   Lymphs Abs 1.2 1.0 - 3.6 K/uL   Monocytes Relative 16 %   Monocytes Absolute 0.8 0.2 - 0.9 K/uL    Eosinophils Relative 2 %   Eosinophils Absolute 0.1 0 - 0.7 K/uL   Basophils Relative 0 %   Basophils Absolute 0.0 0 - 0.1 K/uL  Comprehensive metabolic panel  Result Value Ref Range   Sodium 138 135 - 145 mmol/L   Potassium 3.7 3.5 - 5.1 mmol/L   Chloride 108 101 - 111 mmol/L   CO2 22 22 - 32 mmol/L   Glucose, Bld 82 65 - 99 mg/dL   BUN 13 6 - 20 mg/dL   Creatinine, Ser 0.78 0.44 - 1.00 mg/dL   Calcium 8.8 (L) 8.9 - 10.3 mg/dL   Total Protein 7.5 6.5 - 8.1 g/dL   Albumin 4.3 3.5 - 5.0 g/dL   AST 28 15 - 41 U/L   ALT 17 14 - 54 U/L   Alkaline Phosphatase 62 38 - 126 U/L   Total Bilirubin 0.7 0.3 - 1.2 mg/dL   GFR calc non Af Amer >60 >60 mL/min   GFR calc Af Amer >60 >60 mL/min   Anion gap 8 5 - 15  Lipase, blood  Result Value Ref Range   Lipase 34 11 - 51 U/L      Assessment & Plan:   Problem List Items Addressed This Visit    None    Visit Diagnoses    Pregnancy    -  Primary   Will stop effexor. Going to get [place] to r/o ectopic now. Await results.    Relevant Orders   US OB Comp Less 14 Wks   US OB Transvaginal   Abnormal menstrual periods       + pregnancy today.   Relevant Orders   Pregnancy, urine   US OB Comp Less 14 Wks   US OB Transvaginal   Nausea       + pregnancy today   Relevant Orders   Pregnancy, urine   US OB Comp Less 14 Wks   US OB Transvaginal   Vaginal bleeding before [redacted] weeks gestation       Will obtain US to r/o ectopic. Await results.    Relevant Orders   US OB Comp Less 14 Wks   US OB Transvaginal   Pain, abdominal, RLQ       Will obtain US to r/o ectopic. Await results.    Relevant Orders   US OB Comp Less 14 Wks   US OB Transvaginal       Follow up plan: Return Pending results.

## 2016-02-16 NOTE — Telephone Encounter (Signed)
Called by ultrasound for report. Intrauterine viable pregnancy at [redacted] weeks gestation. However, there is a large subchorionic hemorrhage compressing gestational sac. No need to go to ER at this time. Will get her into see GYN ASAP. Referral put in now and will check in with her tomorrow.

## 2016-02-17 ENCOUNTER — Telehealth: Payer: Self-pay | Admitting: Family Medicine

## 2016-02-17 MED ORDER — VENLAFAXINE HCL ER 37.5 MG PO CP24
37.5000 mg | ORAL_CAPSULE | Freq: Every day | ORAL | 0 refills | Status: DC
Start: 2016-02-17 — End: 2016-03-02

## 2016-02-17 NOTE — Telephone Encounter (Signed)
Gabrielle Jackson states that she has been very dizzy today, thinks it's from coming off the effexor, would like to have lower dose to wean off. Rx sent to her pharmacy.   Had a bad contraction today. Hasn't heard from OBGYN. Aware of risk and watchful waiting. Will take it easy and call with any concerns.

## 2016-02-21 ENCOUNTER — Ambulatory Visit (INDEPENDENT_AMBULATORY_CARE_PROVIDER_SITE_OTHER): Payer: Commercial Managed Care - PPO

## 2016-02-21 ENCOUNTER — Ambulatory Visit (INDEPENDENT_AMBULATORY_CARE_PROVIDER_SITE_OTHER): Payer: Commercial Managed Care - PPO | Admitting: Obstetrics and Gynecology

## 2016-02-21 ENCOUNTER — Encounter: Payer: Self-pay | Admitting: Obstetrics and Gynecology

## 2016-02-21 VITALS — BP 101/63 | Ht 66.0 in | Wt 142.4 lb

## 2016-02-21 DIAGNOSIS — R110 Nausea: Secondary | ICD-10-CM

## 2016-02-21 DIAGNOSIS — O209 Hemorrhage in early pregnancy, unspecified: Secondary | ICD-10-CM

## 2016-02-21 DIAGNOSIS — O43891 Other placental disorders, first trimester: Secondary | ICD-10-CM

## 2016-02-21 DIAGNOSIS — O418X10 Other specified disorders of amniotic fluid and membranes, first trimester, not applicable or unspecified: Secondary | ICD-10-CM

## 2016-02-21 DIAGNOSIS — O468X1 Other antepartum hemorrhage, first trimester: Principal | ICD-10-CM

## 2016-02-21 NOTE — Progress Notes (Addendum)
GYNECOLOGY PROGRESS NOTE  Subjective:    Patient ID: Gabrielle Jackson, female    DOB: 02-Jul-1991, 25 y.o.   MRN: [id number]  HPI  Patient is a 25 y.o. G5P1001 female at 8.[redacted] weeks gestation, EDD 09/30/2016 by LMP 12/25/2015 (consitent with 7 week sono) who presents as a referral from [facility] for subchorionic hemorrhage and vaginal bleeding.  Patient reports discovery of pregnancy last week, after presenting to PCP for complaints of nausea, abnormal menses, and heavy vaginal bleeding. Had an ultrasound performed which confirmed a viable  IUP and a large subchorionic hemorrhage.  Patient notes that she has still had daily bleeding, mostly light flow, and dark brown color with occasional small clots, however notes that she began having some bright red bleeding this morning with associated cramping.     No past medical history on file.  No past medical history on file.  Family History  Problem Relation Age of Onset  . Hypertension Father   . COPD Maternal Grandfather   . COPD Paternal Grandmother     No past surgical history on file.    Social History   Social History  . Marital status: Married    Spouse name: N/A  . Number of children: N/A  . Years of education: N/A   Occupational History  . Not on file.   Social History Main Topics  . Smoking status: Never Smoker  . Smokeless tobacco: Never Used  . Alcohol use 0.0 oz/week     Comment: On occasion  . Drug use: No  . Sexual activity: Yes    Birth control/ protection: None   Other Topics Concern  . Not on file   Social History Narrative  . No narrative on file    Current Outpatient Prescriptions on File Prior to Visit  Medication Sig Dispense Refill  . venlafaxine XR (EFFEXOR XR) 37.5 MG 24 hr capsule Take 1 capsule (37.5 mg total) by mouth daily with breakfast. For 5 days to wean off (Patient not taking: Reported on 02/21/2016) 5 capsule 0   No current facility-administered medications on file  prior to visit.     Allergies  Allergen Reactions  . Penicillins Rash     Review of Systems Pertinent items noted in HPI and remainder of comprehensive ROS otherwise negative.   Objective:   Blood pressure 101/63, height 5\' 6"  (1.676 m), weight 142 lb 6.4 oz (64.6 kg), last menstrual period 12/25/2015. General appearance: alert and no distress, appears stated age Lungs: clear to auscultation bilaterally, no wheezing, rhonchi, rales Heart: regular rate and rhythm, S1, S2 normal, no murmur, click, rub or gallop Abdomen: soft, non-tender; bowel sounds normal; no masses,  no organomegaly Pelvic: external genitalia normal, rectovaginal septum normal.  Vagina without discharge. Small amount of dark red blood in vaginal vault, no clots.  Cervix normal appearing, no lesions and no motion tenderness.  Uterus mobile, nontender, normal shape and size (~ 6-8 weeks).  Adnexae non-palpable, nontender bilaterally.  Extremities: extremities normal, atraumatic, no cyanosis or edema Neurologic: Grossly normal   Imaging (02/16/2016):  CLINICAL DATA:  Right lower quadrant pain, cramping, and vaginal bleeding for 3 days. Nausea. Gestational age by LMP of 7 weeks 4 days.  EXAM: OBSTETRIC <14 WK [place] AND TRANSVAGINAL OB US  TECHNIQUE: Both transabdominal and transvaginal ultrasound examinations were performed for complete evaluation of the gestation as well as the maternal uterus, adnexal regions, and pelvic cul-de-sac. Transvaginal technique was performed to assess early pregnancy.  COMPARISON:  None.  FINDINGS: Intrauterine gestational sac: Single  Yolk sac:  Visualized  Embryo:  Visualized  Cardiac Activity: Visualized  Heart Rate: 155  bpm  CRL:  10  mm   7 w   0 d                  [place] EDC: 10/04/2016  Subchorionic hemorrhage: Large subchorionic hemorrhage seen measuring 4.3 x 2.5 cm, which compresses the gestational sac.  Maternal uterus/adnexae: Right ovarian corpus luteum  noted. Normal appearance of left ovary. No adnexal mass or free fluid identified.  IMPRESSION: Single living IUP measuring 7 weeks 0 days with [place] EDC of 10/04/2016. This is concordant with LMP.  Large subchorionic hemorrhage.   Assessment:   Subchorionic hemorrhage Vaginal bleeding in early pregnancy Nausea  Plan:   1. Subchorionic hemorrhage with vaginal bleeding - discussed that most subchorionic hemorrhages resolve by the end of the 1st trimester, however she may still experience bleeding during this time.  Noted that there is a slightly higher chance of miscarriage. Given bleeding/SAB precautions.  Due to patient's history of new onset bright red bleeding, will get ultrasound to confirm viability.  Review of patient's chart notes that she is O+ blood type, does not require Rhogam.  Advised on pelvic rest until out of 1st trimester.   2. Nausea - patient currently controlling with dietary modifications.  Can prescribe Diclegis if nausea worsens.  Or patient can get Vitamin B6 and Doxylamine OTC.   3. Will have patient f/u in 2 weeks for NOB intake, and 4 weeks for NOB physical.    Rubie Maid, MD [facility]

## 2016-02-21 NOTE — Patient Instructions (Signed)
Subchorionic Hematoma °A subchorionic hematoma is a gathering of blood between the outer wall of the placenta and the inner wall of the womb (uterus). The placenta is the organ that connects the fetus to the wall of the uterus. The placenta performs the feeding, breathing (oxygen to the fetus), and waste removal (excretory work) of the fetus.  °Subchorionic hematoma is the most common abnormality found on a result from ultrasonography done during the first trimester or early second trimester of pregnancy. If there has been little or no vaginal bleeding, early small hematomas usually shrink on their own and do not affect your baby or pregnancy. The blood is gradually absorbed over 1-2 weeks. When bleeding starts later in pregnancy or the hematoma is larger or occurs in an older pregnant woman, the outcome may not be as good. Larger hematomas may get bigger, which increases the chances for miscarriage. Subchorionic hematoma also increases the risk of premature detachment of the placenta from the uterus, preterm (premature) labor, and stillbirth. °HOME CARE INSTRUCTIONS °· Stay on bed rest if your health care provider recommends this. Although bed rest will not prevent more bleeding or prevent a miscarriage, your health care provider may recommend bed rest until you are advised otherwise. °· Avoid heavy lifting (more than 10 lb [4.5 kg]), exercise, sexual intercourse, or douching as directed by your health care provider. °· Keep track of the number of pads you use each day and how soaked (saturated) they are. Write down this information. °· Do not use tampons. °· Keep all follow-up appointments as directed by your health care provider. Your health care provider may ask you to have follow-up blood tests or ultrasound tests or both. °SEEK IMMEDIATE MEDICAL CARE IF: °· You have severe cramps in your stomach, back, abdomen, or pelvis. °· You have a fever. °· You pass large clots or tissue. Save any tissue for your health  care provider to look at. °· Your bleeding increases or you become lightheaded, feel weak, or have fainting episodes. °  °This information is not intended to replace advice given to you by your health care provider. Make sure you discuss any questions you have with your health care provider. °  °Document Released: 10/03/2006 Document Revised: 07/09/2014 Document Reviewed: 01/15/2013 °Elsevier Interactive Patient Education ©2016 [employer] ° °

## 2016-02-22 ENCOUNTER — Encounter: Payer: Self-pay | Admitting: Obstetrics and Gynecology

## 2016-02-28 ENCOUNTER — Other Ambulatory Visit: Payer: Self-pay

## 2016-02-28 DIAGNOSIS — J0100 Acute maxillary sinusitis, unspecified: Secondary | ICD-10-CM

## 2016-02-28 DIAGNOSIS — H6690 Otitis media, unspecified, unspecified ear: Secondary | ICD-10-CM

## 2016-02-28 MED ORDER — OFLOXACIN 0.3 % OT SOLN: 5.0000 [drp] | Freq: Every day | OTIC | 0 refills | Status: DC

## 2016-02-28 MED ORDER — AZITHROMYCIN 500 MG PO TABS: 500.0000 mg | ORAL_TABLET | Freq: Every day | ORAL | 0 refills | Status: AC

## 2016-03-02 ENCOUNTER — Ambulatory Visit (INDEPENDENT_AMBULATORY_CARE_PROVIDER_SITE_OTHER): Payer: Commercial Managed Care - PPO | Admitting: Obstetrics and Gynecology

## 2016-03-02 ENCOUNTER — Other Ambulatory Visit: Payer: Self-pay | Admitting: Obstetrics and Gynecology

## 2016-03-02 VITALS — BP 98/70 | HR 80 | Wt 142.9 lb

## 2016-03-02 DIAGNOSIS — Z3481 Encounter for supervision of other normal pregnancy, first trimester: Secondary | ICD-10-CM

## 2016-03-02 DIAGNOSIS — Z3491 Encounter for supervision of normal pregnancy, unspecified, first trimester: Secondary | ICD-10-CM

## 2016-03-02 LAB — OB RESULTS CONSOLE VARICELLA ZOSTER ANTIBODY, IGG: Varicella: IMMUNE

## 2016-03-02 MED ORDER — PRENATE MINI 18-0.6-0.4-350 MG PO CAPS: 18.0000 mg | ORAL_CAPSULE | Freq: Every day | ORAL | 11 refills | Status: DC

## 2016-03-02 NOTE — Patient Instructions (Signed)
First Trimester of Pregnancy The first trimester of pregnancy is from week 1 until the end of week 12 (months 1 through 3). A week after a sperm fertilizes an egg, the egg will implant on the wall of the uterus. This embryo will begin to develop into a baby. Genes from you and your partner are forming the baby. The female genes determine whether the baby is a boy or a girl. At 6-8 weeks, the eyes and face are formed, and the heartbeat can be seen on ultrasound. At the end of 12 weeks, all the baby's organs are formed.  Now that you are pregnant, you will want to do everything you can to have a healthy baby. Two of the most important things are to get good prenatal care and to follow your health care provider's instructions. Prenatal care is all the medical care you receive before the baby's birth. This care will help prevent, find, and treat any problems during the pregnancy and childbirth. BODY CHANGES Your body goes through many changes during pregnancy. The changes vary from woman to woman.   You may gain or lose a couple of pounds at first.  You may feel sick to your stomach (nauseous) and throw up (vomit). If the vomiting is uncontrollable, call your health care provider.  You may tire easily.  You may develop headaches that can be relieved by medicines approved by your health care provider.  You may urinate more often. Painful urination may mean you have a bladder infection.  You may develop heartburn as a result of your pregnancy.  You may develop constipation because certain hormones are causing the muscles that push waste through your intestines to slow down.  You may develop hemorrhoids or swollen, bulging veins (varicose veins).  Your breasts may begin to grow larger and become tender. Your nipples may stick out more, and the tissue that surrounds them (areola) may become darker.  Your gums may bleed and may be sensitive to brushing and flossing.  Dark spots or blotches (chloasma,  mask of pregnancy) may develop on your face. This will likely fade after the baby is born.  Your menstrual periods will stop.  You may have a loss of appetite.  You may develop cravings for certain kinds of food.  You may have changes in your emotions from day to day, such as being excited to be pregnant or being concerned that something may go wrong with the pregnancy and baby.  You may have more vivid and strange dreams.  You may have changes in your hair. These can include thickening of your hair, rapid growth, and changes in texture. Some women also have hair loss during or after pregnancy, or hair that feels dry or thin. Your hair will most likely return to normal after your baby is born. WHAT TO EXPECT AT YOUR PRENATAL VISITS During a routine prenatal visit:  You will be weighed to make sure you and the baby are growing normally.  Your blood pressure will be taken.  Your abdomen will be measured to track your baby's growth.  The fetal heartbeat will be listened to starting around week 10 or 12 of your pregnancy.  Test results from any previous visits will be discussed. Your health care provider may ask you:  How you are feeling.  If you are feeling the baby move.  If you have had any abnormal symptoms, such as leaking fluid, bleeding, severe headaches, or abdominal cramping.  If you are using any tobacco products,   including cigarettes, chewing tobacco, and electronic cigarettes.  If you have any questions. Other tests that may be performed during your first trimester include:  Blood tests to find your blood type and to check for the presence of any previous infections. They will also be used to check for low iron levels (anemia) and Rh antibodies. Later in the pregnancy, blood tests for diabetes will be done along with other tests if problems develop.  Urine tests to check for infections, diabetes, or protein in the urine.  An ultrasound to confirm the proper growth  and development of the baby.  An amniocentesis to check for possible genetic problems.  Fetal screens for spina bifida and Down syndrome.  You may need other tests to make sure you and the baby are doing well.  HIV (human immunodeficiency virus) testing. Routine prenatal testing includes screening for HIV, unless you choose not to have this test. HOME CARE INSTRUCTIONS  Medicines  Follow your health care provider's instructions regarding medicine use. Specific medicines may be either safe or unsafe to take during pregnancy.  Take your prenatal vitamins as directed.  If you develop constipation, try taking a stool softener if your health care provider approves. Diet  Eat regular, well-balanced meals. Choose a variety of foods, such as meat or vegetable-based protein, fish, milk and low-fat dairy products, vegetables, fruits, and whole grain breads and cereals. Your health care provider will help you determine the amount of weight gain that is right for you.  Avoid raw meat and uncooked cheese. These carry germs that can cause birth defects in the baby.  Eating four or five small meals rather than three large meals a day may help relieve nausea and vomiting. If you start to feel nauseous, eating a few soda crackers can be helpful. Drinking liquids between meals instead of during meals also seems to help nausea and vomiting.  If you develop constipation, eat more high-fiber foods, such as fresh vegetables or fruit and whole grains. Drink enough fluids to keep your urine clear or pale yellow. Activity and Exercise  Exercise only as directed by your health care provider. Exercising will help you:  Control your weight.  Stay in shape.  Be prepared for labor and delivery.  Experiencing pain or cramping in the lower abdomen or low back is a good sign that you should stop exercising. Check with your health care provider before continuing normal exercises.  Try to avoid standing for long  periods of time. Move your legs often if you must stand in one place for a long time.  Avoid heavy lifting.  Wear low-heeled shoes, and practice good posture.  You may continue to have sex unless your health care provider directs you otherwise. Relief of Pain or Discomfort  Wear a good support bra for breast tenderness.   Take warm sitz baths to soothe any pain or discomfort caused by hemorrhoids. Use hemorrhoid cream if your health care provider approves.   Rest with your legs elevated if you have leg cramps or low back pain.  If you develop varicose veins in your legs, wear support hose. Elevate your feet for 15 minutes, 3-4 times a day. Limit salt in your diet. Prenatal Care  Schedule your prenatal visits by the twelfth week of pregnancy. They are usually scheduled monthly at first, then more often in the last 2 months before delivery.  Write down your questions. Take them to your prenatal visits.  Keep all your prenatal visits as directed by your   health care provider. Safety  Wear your seat belt at all times when driving.  Make a list of emergency phone numbers, including numbers for family, friends, the hospital, and police and fire departments. General Tips  Ask your health care provider for a referral to a local prenatal education class. Begin classes no later than at the beginning of month 6 of your pregnancy.  Ask for help if you have counseling or nutritional needs during pregnancy. Your health care provider can offer advice or refer you to specialists for help with various needs.  Do not use hot tubs, steam rooms, or saunas.  Do not douche or use tampons or scented sanitary pads.  Do not cross your legs for long periods of time.  Avoid cat litter boxes and soil used by cats. These carry germs that can cause birth defects in the baby and possibly loss of the fetus by miscarriage or stillbirth.  Avoid all smoking, herbs, alcohol, and medicines not prescribed by  your health care provider. Chemicals in these affect the formation and growth of the baby.  Do not use any tobacco products, including cigarettes, chewing tobacco, and electronic cigarettes. If you need help quitting, ask your health care provider. You may receive counseling support and other resources to help you quit.  Schedule a dentist appointment. At home, brush your teeth with a soft toothbrush and be gentle when you floss. SEEK MEDICAL CARE IF:   You have dizziness.  You have mild pelvic cramps, pelvic pressure, or nagging pain in the abdominal area.  You have persistent nausea, vomiting, or diarrhea.  You have a bad smelling vaginal discharge.  You have pain with urination.  You notice increased swelling in your face, hands, legs, or ankles. SEEK IMMEDIATE MEDICAL CARE IF:   You have a fever.  You are leaking fluid from your vagina.  You have spotting or bleeding from your vagina.  You have severe abdominal cramping or pain.  You have rapid weight gain or loss.  You vomit blood or material that looks like coffee grounds.  You are exposed to German measles and have never had them.  You are exposed to fifth disease or chickenpox.  You develop a severe headache.  You have shortness of breath.  You have any kind of trauma, such as from a fall or a car accident.   This information is not intended to replace advice given to you by your health care provider. Make sure you discuss any questions you have with your health care provider.   Document Released: 06/12/2001 Document Revised: 07/09/2014 Document Reviewed: 04/28/2013 Elsevier Interactive Patient Education 2016 [employer] Inc.  

## 2016-03-02 NOTE — Progress Notes (Signed)
Patient ID: Gabrielle Jackson, female   DOB: [birth year], 25 y.o.   MRN: [id number]  Chief Complaint  Patient presents with  . Initial Prenatal Visit    9wk 5d    HPI .Gabrielle Jackson presents for NOB nurse interview visit. G2.  P1. Pregnancy education material explained and given. No cats in the home. NOB labs ordered. HIV labs and Drug screen were explained optional and she could opt out of tests but did not decline. Drug screen ordered. PNV encouraged and rx sent to pharmacy. NT to discuss with provider. Pt. To follow up with provider in 2-3 weeks for NOB physical.  All questions answered.    HPI  No past medical history on file.  No past surgical history on file.  Family History  Problem Relation Age of Onset  . Hypertension Father   . COPD Maternal Grandfather   . COPD Paternal Grandmother     Social Histo Social History  Substance Use Topics  . Smoking status: Never Smoker  . Smokeless tobacco: Never Used  . Alcohol use 0.0 oz/week     Comment: On occasion    Allergies  Allergen Reactions  . Penicillins Rash    Current Outpatient Prescriptions  Medication Sig Dispense Refill  . azithromycin (ZITHROMAX) 500 MG tablet Take 1 tablet (500 mg total) by mouth daily. 3 tablet 0  . ofloxacin (FLOXIN) 0.3 % otic solution Place 5 drops into both ears daily. 5 mL 0  . Prenat-FeCbn-FeAsp-Meth-FA-DHA (PRENATE MINI) 18-0.6-0.4-350 MG CAPS Take 18 mg by mouth daily. 30 capsule 11   No current facility-administered medications for this visit.     Review of Systems Review of Systems  Blood pressure 98/70, pulse 80, weight 142 lb 14.4 oz (64.8 kg), last menstrual period 12/25/2015.  Physical Exam Physical Exam  Data Reviewed  N/A  Assessment    N/A    Plan    Follow up in 2-3wks to see provider       [place] 03/02/2016, 4:08 PM

## 2016-03-03 LAB — PAIN MGT SCRN (14 DRUGS), UR
Amphetamine Screen, Ur: NEGATIVE ng/mL
Barbiturate Screen, Ur: NEGATIVE ng/mL
Benzodiazepine Screen, Urine: NEGATIVE ng/mL
Buprenorphine, Urine: NEGATIVE ng/mL
CREATININE(CRT), U: 115.3 mg/dL (ref 20.0–300.0)
Cannabinoids Ur Ql Scn: NEGATIVE ng/mL
Cocaine(Metab.)Screen, Urine: NEGATIVE ng/mL
FENTANYL, URINE: NEGATIVE pg/mL
Meperidine Screen, Urine: NEGATIVE ng/mL
Methadone Scn, Ur: NEGATIVE ng/mL
OXYCODONE+OXYMORPHONE UR QL SCN: NEGATIVE ng/mL
Opiate Scrn, Ur: NEGATIVE ng/mL
PCP SCRN UR: NEGATIVE ng/mL
PH UR, DRUG SCRN: 6.1 (ref 4.5–8.9)
Propoxyphene, Screen: NEGATIVE ng/mL
TRAMADOL UR QL SCN: NEGATIVE ng/mL

## 2016-03-03 LAB — DRUG PROFILE, UR, 9 DRUGS (LABCORP)
AMPHETAMINES, URINE: NEGATIVE ng/mL
BARBITURATE QUANT UR: NEGATIVE ng/mL
BENZODIAZEPINE QUANT UR: NEGATIVE ng/mL
Cannabinoid Quant, Ur: NEGATIVE ng/mL
Cocaine (Metab.): NEGATIVE ng/mL
Methadone Screen, Urine: NEGATIVE ng/mL
Opiate Quant, Ur: NEGATIVE ng/mL
PCP Quant, Ur: NEGATIVE ng/mL
PROPOXYPHENE: NEGATIVE ng/mL

## 2016-03-03 LAB — MICROSCOPIC EXAMINATION: CASTS: NONE SEEN /LPF

## 2016-03-03 LAB — URINALYSIS, ROUTINE W REFLEX MICROSCOPIC
BILIRUBIN UA: NEGATIVE
GLUCOSE, UA: NEGATIVE
KETONES UA: NEGATIVE
LEUKOCYTES UA: NEGATIVE
NITRITE UA: NEGATIVE
Protein, UA: NEGATIVE
SPEC GRAV UA: 1.022 (ref 1.005–1.030)
Urobilinogen, Ur: 0.2 mg/dL (ref 0.2–1.0)
pH, UA: 6.5 (ref 5.0–7.5)

## 2016-03-03 LAB — URINE CULTURE: ORGANISM ID, BACTERIA: NO GROWTH

## 2016-03-04 LAB — CBC WITH DIFFERENTIAL/PLATELET
BASOS: 0 %
Basophils Absolute: 0 10*3/uL (ref 0.0–0.2)
EOS (ABSOLUTE): 0.1 10*3/uL (ref 0.0–0.4)
EOS: 1 %
HEMATOCRIT: 37.8 % (ref 34.0–46.6)
HEMOGLOBIN: 13 g/dL (ref 11.1–15.9)
Immature Grans (Abs): 0 10*3/uL (ref 0.0–0.1)
Immature Granulocytes: 0 %
LYMPHS ABS: 2.6 10*3/uL (ref 0.7–3.1)
Lymphs: 21 %
MCH: 31.4 pg (ref 26.6–33.0)
MCHC: 34.4 g/dL (ref 31.5–35.7)
MCV: 91 fL (ref 79–97)
MONOCYTES: 7 %
MONOS ABS: 0.9 10*3/uL (ref 0.1–0.9)
NEUTROS ABS: 8.5 10*3/uL — AB (ref 1.4–7.0)
Neutrophils: 71 %
Platelets: 398 10*3/uL — ABNORMAL HIGH (ref 150–379)
RBC: 4.14 x10E6/uL (ref 3.77–5.28)
RDW: 12.5 % (ref 12.3–15.4)
WBC: 12.1 10*3/uL — AB (ref 3.4–10.8)

## 2016-03-04 LAB — ABO AND RH: Rh Factor: POSITIVE

## 2016-03-04 LAB — ANTIBODY SCREEN: Antibody Screen: NEGATIVE

## 2016-03-04 LAB — HEP, RPR, HIV PANEL
HIV Screen 4th Generation wRfx: NONREACTIVE
Hepatitis B Surface Ag: NEGATIVE
RPR: NONREACTIVE

## 2016-03-04 LAB — RUBELLA SCREEN: Rubella Antibodies, IGG: 1.1 index (ref >0.99)

## 2016-03-04 LAB — VARICELLA ZOSTER ANTIBODY, IGG: VARICELLA: 1760 {index} (ref >165)

## 2016-03-05 LAB — GC/CHLAMYDIA PROBE AMP
Chlamydia trachomatis, NAA: NEGATIVE
Neisseria gonorrhoeae by PCR: NEGATIVE

## 2016-03-12 ENCOUNTER — Telehealth: Payer: Self-pay | Admitting: Obstetrics and Gynecology

## 2016-03-12 NOTE — Telephone Encounter (Signed)
Melody gave pt an antibiotic for sinus infection / ear infection.  She finished her antibiotics of 3 days and still has ears still hurt/ throat hurts, lymph nodes still swollen, congestion better though.

## 2016-03-13 NOTE — Telephone Encounter (Signed)
pls advise

## 2016-03-13 NOTE — Telephone Encounter (Signed)
I would have her evaluated by ENT

## 2016-03-22 ENCOUNTER — Encounter: Payer: Commercial Managed Care - PPO | Admitting: Obstetrics and Gynecology

## 2016-07-02 NOTE — L&D Delivery Note (Signed)
Delivery Note At 3:17 AM a viable female was delivered via Vaginal, Spontaneous Delivery (Presentation: ROA).  APGAR: 8, 9; weight 7 lb 13.9 oz (3570 g).   Placenta status: spontaneously, intact.  Cord: 3VC with the following complications: None.  Cord pH: n/a  Anesthesia: epidural Episiotomy: None Lacerations: 2nd degree;Perineal Suture Repair: 3.0 vicryl Est. Blood Loss (mL): 400  Mom to postpartum.  Baby to Couplet care / Skin to Skin.  Called to see patient.  Mom pushed to deliver a viable female infant.  The head followed by shoulders, which delivered without difficulty, and the rest of the body.  One nuchal cord noted and reduced at the perineum.  Baby to mom's chest.  Cord clamped and cut after > 1 min delay.  Cord blood obtained.  Placenta delivered via manual removal as it did not pass within 30 minutes and was showing no signs of detaching.  The placenta was inspected and found to be intact, with a 3-vessel cord.  Second degree perineal laceration repaired with 3-0 Vicryl in standard fashion.  All counts correct.  Hemostasis obtained with IV pitocin and fundal massage. EBL 400 mL.     Prentice Docker, MD 10/03/2016, 4:36 AM

## 2016-09-05 ENCOUNTER — Ambulatory Visit (INDEPENDENT_AMBULATORY_CARE_PROVIDER_SITE_OTHER): Payer: Commercial Managed Care - PPO | Admitting: Advanced Practice Midwife

## 2016-09-05 VITALS — BP 112/70 | Wt 182.0 lb

## 2016-09-05 DIAGNOSIS — Z3A36 36 weeks gestation of pregnancy: Secondary | ICD-10-CM

## 2016-09-05 DIAGNOSIS — Z3685 Encounter for antenatal screening for Streptococcus B: Secondary | ICD-10-CM

## 2016-09-05 DIAGNOSIS — Z113 Encounter for screening for infections with a predominantly sexual mode of transmission: Secondary | ICD-10-CM

## 2016-09-05 LAB — OB RESULTS CONSOLE GBS: STREP GROUP B AG: NEGATIVE

## 2016-09-05 NOTE — Addendum Note (Signed)
Addended by: Dorothyann Gibbs on: 09/05/2016 04:35 PM   Modules accepted: Orders

## 2016-09-05 NOTE — Progress Notes (Signed)
GBS/Aptima

## 2016-09-05 NOTE — Progress Notes (Signed)
Has been having q 15 min ctxs today. Labor precautions and comfort measures reviewed. Reminded to stay hydrated. Gbs/aptima today.

## 2016-09-06 LAB — GC/CHLAMYDIA PROBE AMP

## 2016-09-13 ENCOUNTER — Ambulatory Visit (INDEPENDENT_AMBULATORY_CARE_PROVIDER_SITE_OTHER): Payer: Commercial Managed Care - PPO | Admitting: Obstetrics and Gynecology

## 2016-09-13 VITALS — BP 94/68 | Wt 183.0 lb

## 2016-09-13 DIAGNOSIS — Z3483 Encounter for supervision of other normal pregnancy, third trimester: Secondary | ICD-10-CM

## 2016-09-13 DIAGNOSIS — Z3685 Encounter for antenatal screening for Streptococcus B: Secondary | ICD-10-CM

## 2016-09-13 DIAGNOSIS — Z3A36 36 weeks gestation of pregnancy: Secondary | ICD-10-CM

## 2016-09-13 NOTE — Progress Notes (Signed)
GBS repeated today with sensitivities

## 2016-09-13 NOTE — Patient Instructions (Signed)
Braxton Hicks Contractions Contractions of the uterus can occur throughout pregnancy, but they are not always a sign that you are in labor. You may have practice contractions called Braxton Hicks contractions. These false labor contractions are sometimes confused with true labor. What are Braxton Hicks contractions? Braxton Hicks contractions are tightening movements that occur in the muscles of the uterus before labor. Unlike true labor contractions, these contractions do not result in opening (dilation) and thinning of the cervix. Toward the end of pregnancy (32-34 weeks), Braxton Hicks contractions can happen more often and may become stronger. These contractions are sometimes difficult to tell apart from true labor because they can be very uncomfortable. You should not feel embarrassed if you go to the hospital with false labor. Sometimes, the only way to tell if you are in true labor is for your health care provider to look for changes in the cervix. The health care provider will do a physical exam and may monitor your contractions. If you are not in true labor, the exam should show that your cervix is not dilating and your water has not broken. If there are no prenatal problems or other health problems associated with your pregnancy, it is completely safe for you to be sent home with false labor. You may continue to have Braxton Hicks contractions until you go into true labor. How can I tell the difference between true labor and false labor?  Differences ? False labor ? Contractions last 30-70 seconds.: Contractions are usually shorter and not as strong as true labor contractions. ? Contractions become very regular.: Contractions are usually irregular. ? Discomfort is usually felt in the top of the uterus, and it spreads to the lower abdomen and low back.: Contractions are often felt in the front of the lower abdomen and in the groin. ? Contractions do not go away with walking.: Contractions may  go away when you walk around or change positions while lying down. ? Contractions usually become more intense and increase in frequency.: Contractions get weaker and are shorter-lasting as time goes on. ? The cervix dilates and gets thinner.: The cervix usually does not dilate or become thin. Follow these instructions at home:  Take over-the-counter and prescription medicines only as told by your health care provider.  Keep up with your usual exercises and follow other instructions from your health care provider.  Eat and drink lightly if you think you are going into labor.  If Braxton Hicks contractions are making you uncomfortable: ? Change your position from lying down or resting to walking, or change from walking to resting. ? Sit and rest in a tub of warm water. ? Drink enough fluid to keep your urine clear or pale yellow. Dehydration may cause these contractions. ? Do slow and deep breathing several times an hour.  Keep all follow-up prenatal visits as told by your health care provider. This is important. Contact a health care provider if:  You have a fever.  You have continuous pain in your abdomen. Get help right away if:  Your contractions become stronger, more regular, and closer together.  You have fluid leaking or gushing from your vagina.  You pass blood-tinged mucus (bloody show).  You have bleeding from your vagina.  You have low back pain that you never had before.  You feel your baby's head pushing down and causing pelvic pressure.  Your baby is not moving inside you as much as it used to. Summary  Contractions that occur before labor are   called Braxton Hicks contractions, false labor, or practice contractions.  Braxton Hicks contractions are usually shorter, weaker, farther apart, and less regular than true labor contractions. True labor contractions usually become progressively stronger and regular and they become more frequent.  Manage discomfort from  [facility] contractions by changing position, resting in a warm bath, drinking plenty of water, or practicing deep breathing. This information is not intended to replace advice given to you by your health care provider. Make sure you discuss any questions you have with your health care provider. Document Released: 06/18/2005 Document Revised: 05/07/2016 Document Reviewed: 05/07/2016 Elsevier Interactive Patient Education  2017 [place] of Pregnancy The third trimester is from week 28 through week 40 (months 7 through 9). The third trimester is a time when the unborn baby (fetus) is growing rapidly. At the end of the ninth month, the fetus is about 20 inches in length and weighs 6-10 pounds. Body changes during your third trimester Your body will continue to go through many changes during pregnancy. The changes vary from woman to woman. During the third trimester:  Your weight will continue to increase. You can expect to gain 25-35 pounds (11-16 kg) by the end of the pregnancy.  You may begin to get stretch marks on your hips, abdomen, and breasts.  You may urinate more often because the fetus is moving lower into your pelvis and pressing on your bladder.  You may develop or continue to have heartburn. This is caused by increased hormones that slow down muscles in the digestive tract.  You may develop or continue to have constipation because increased hormones slow digestion and cause the muscles that push waste through your intestines to relax.  You may develop hemorrhoids. These are swollen veins (varicose veins) in the rectum that can itch or be painful.  You may develop swollen, bulging veins (varicose veins) in your legs.  You may have increased body aches in the pelvis, back, or thighs. This is due to weight gain and increased hormones that are relaxing your joints.  You may have changes in your hair. These can include thickening of your hair, rapid growth, and  changes in texture. Some women also have hair loss during or after pregnancy, or hair that feels dry or thin. Your hair will most likely return to normal after your baby is born.  Your breasts will continue to grow and they will continue to become tender. A yellow fluid (colostrum) may leak from your breasts. This is the first milk you are producing for your baby.  Your belly button may stick out.  You may notice more swelling in your hands, face, or ankles.  You may have increased tingling or numbness in your hands, arms, and legs. The skin on your belly may also feel numb.  You may feel short of breath because of your expanding uterus.  You may have more problems sleeping. This can be caused by the size of your belly, increased need to urinate, and an increase in your body's metabolism.  You may notice the fetus "dropping," or moving lower in your abdomen (lightening).  You may have increased vaginal discharge.  You may notice your joints feel loose and you may have pain around your pelvic bone. What to expect at prenatal visits You will have prenatal exams every 2 weeks until week 36. Then you will have weekly prenatal exams. During a routine prenatal visit:  You will be weighed to make sure you and the baby  are growing normally.  Your blood pressure will be taken.  Your abdomen will be measured to track your baby's growth.  The fetal heartbeat will be listened to.  Any test results from the previous visit will be discussed.  You may have a cervical check near your due date to see if your cervix has softened or thinned (effaced).  You will be tested for Group B streptococcus. This happens between 35 and 37 weeks. Your health care provider may ask you:  What your birth plan is.  How you are feeling.  If you are feeling the baby move.  If you have had any abnormal symptoms, such as leaking fluid, bleeding, severe headaches, or abdominal cramping.  If you are using any  tobacco products, including cigarettes, chewing tobacco, and electronic cigarettes.  If you have any questions. Other tests or screenings that may be performed during your third trimester include:  Blood tests that check for low iron levels (anemia).  Fetal testing to check the health, activity level, and growth of the fetus. Testing is done if you have certain medical conditions or if there are problems during the pregnancy.  Nonstress test (NST). This test checks the health of your baby to make sure there are no signs of problems, such as the baby not getting enough oxygen. During this test, a belt is placed around your belly. The baby is made to move, and its heart rate is monitored during movement. What is false labor? False labor is a condition in which you feel small, irregular tightenings of the muscles in the womb (contractions) that usually go away with rest, changing position, or drinking water. These are called Braxton Hicks contractions. Contractions may last for hours, days, or even weeks before true labor sets in. If contractions come at regular intervals, become more frequent, increase in intensity, or become painful, you should see your health care provider. What are the signs of labor?  Abdominal cramps.  Regular contractions that start at 10 minutes apart and become stronger and more frequent with time.  Contractions that start on the top of the uterus and spread down to the lower abdomen and back.  Increased pelvic pressure and dull back pain.  A watery or bloody mucus discharge that comes from the vagina.  Leaking of amniotic fluid. This is also known as your "water breaking." It could be a slow trickle or a gush. Let your health care provider know if it has a color or strange odor. If you have any of these signs, call your health care provider right away, even if it is before your due date. Follow these instructions at home: Medicines   Follow your health care  provider's instructions regarding medicine use. Specific medicines may be either safe or unsafe to take during pregnancy.  Take a prenatal vitamin that contains at least 600 micrograms (mcg) of folic acid.  If you develop constipation, try taking a stool softener if your health care provider approves. Eating and drinking   Eat a balanced diet that includes fresh fruits and vegetables, whole grains, good sources of protein such as meat, eggs, or tofu, and low-fat dairy. Your health care provider will help you determine the amount of weight gain that is right for you.  Avoid raw meat and uncooked cheese. These carry germs that can cause birth defects in the baby.  If you have low calcium intake from food, talk to your health care provider about whether you should take a daily calcium supplement.  Eat four or five small meals rather than three large meals a day.  Limit foods that are high in fat and processed sugars, such as fried and sweet foods.  To prevent constipation:  Drink enough fluid to keep your urine clear or pale yellow.  Eat foods that are high in fiber, such as fresh fruits and vegetables, whole grains, and beans. Activity   Exercise only as directed by your health care provider. Most women can continue their usual exercise routine during pregnancy. Try to exercise for 30 minutes at least 5 days a week. Stop exercising if you experience uterine contractions.  Avoid heavy lifting.  Do not exercise in extreme heat or humidity, or at high altitudes.  Wear low-heel, comfortable shoes.  Practice good posture.  You may continue to have sex unless your health care provider tells you otherwise. Relieving pain and discomfort   Take frequent breaks and rest with your legs elevated if you have leg cramps or low back pain.  Take warm sitz baths to soothe any pain or discomfort caused by hemorrhoids. Use hemorrhoid cream if your health care provider approves.  Wear a good  support bra to prevent discomfort from breast tenderness.  If you develop varicose veins:  Wear support pantyhose or compression stockings as told by your healthcare provider.  Elevate your feet for 15 minutes, 3-4 times a day. Prenatal care   Write down your questions. Take them to your prenatal visits.  Keep all your prenatal visits as told by your health care provider. This is important. Safety   Wear your seat belt at all times when driving.  Make a list of emergency phone numbers, including numbers for family, friends, the hospital, and police and fire departments. General instructions   Avoid cat litter boxes and soil used by cats. These carry germs that can cause birth defects in the baby. If you have a cat, ask someone to clean the litter box for you.  Do not travel far distances unless it is absolutely necessary and only with the approval of your health care provider.  Do not use hot tubs, steam rooms, or saunas.  Do not drink alcohol.  Do not use any products that contain nicotine or tobacco, such as cigarettes and e-cigarettes. If you need help quitting, ask your health care provider.  Do not use any medicinal herbs or unprescribed drugs. These chemicals affect the formation and growth of the baby.  Do not douche or use tampons or scented sanitary pads.  Do not cross your legs for long periods of time.  To prepare for the arrival of your baby:  Take prenatal classes to understand, practice, and ask questions about labor and delivery.  Make a trial run to the hospital.  Visit the hospital and tour the maternity area.  Arrange for maternity or paternity leave through employers.  Arrange for family and friends to take care of pets while you are in the hospital.  Purchase a rear-facing car seat and make sure you know how to install it in your car.  Pack your hospital bag.  Prepare the baby's nursery. Make sure to remove all pillows and stuffed animals from  the baby's crib to prevent suffocation.  Visit your dentist if you have not gone during your pregnancy. Use a soft toothbrush to brush your teeth and be gentle when you floss. Contact a health care provider if:  You are unsure if you are in labor or if your water has broken.  You  become dizzy.  You have mild pelvic cramps, pelvic pressure, or nagging pain in your abdominal area.  You have lower back pain.  You have persistent nausea, vomiting, or diarrhea.  You have an unusual or bad smelling vaginal discharge.  You have pain when you urinate. Get help right away if:  Your water breaks before 37 weeks.  You have regular contractions less than 5 minutes apart before 37 weeks.  You have a fever.  You are leaking fluid from your vagina.  You have spotting or bleeding from your vagina.  You have severe abdominal pain or cramping.  You have rapid weight loss or weight gain.  You have shortness of breath with chest pain.  You notice sudden or extreme swelling of your face, hands, ankles, feet, or legs.  Your baby makes fewer than 10 movements in 2 hours.  You have severe headaches that do not go away when you take medicine.  You have vision changes. Summary  The third trimester is from week 28 through week 40, months 7 through 9. The third trimester is a time when the unborn baby (fetus) is growing rapidly.  During the third trimester, your discomfort may increase as you and your baby continue to gain weight. You may have abdominal, leg, and back pain, sleeping problems, and an increased need to urinate.  During the third trimester your breasts will keep growing and they will continue to become tender. A yellow fluid (colostrum) may leak from your breasts. This is the first milk you are producing for your baby.  False labor is a condition in which you feel small, irregular tightenings of the muscles in the womb (contractions) that eventually go away. These are called  Braxton Hicks contractions. Contractions may last for hours, days, or even weeks before true labor sets in.  Signs of labor can include: abdominal cramps; regular contractions that start at 10 minutes apart and become stronger and more frequent with time; watery or bloody mucus discharge that comes from the vagina; increased pelvic pressure and dull back pain; and leaking of amniotic fluid. This information is not intended to replace advice given to you by your health care provider. Make sure you discuss any questions you have with your health care provider. Document Released: 06/12/2001 Document Revised: 11/24/2015 Document Reviewed: 08/19/2012 Elsevier Interactive Patient Education  2017 [employer].

## 2016-09-14 LAB — STREP GP B CULTURE+RFLX

## 2016-09-17 ENCOUNTER — Encounter: Payer: Self-pay | Admitting: Obstetrics and Gynecology

## 2016-09-17 LAB — STREP GP B CULTURE+RFLX: STREP GP B CULTURE+RFLX: NEGATIVE

## 2016-09-20 ENCOUNTER — Ambulatory Visit (INDEPENDENT_AMBULATORY_CARE_PROVIDER_SITE_OTHER): Payer: Commercial Managed Care - PPO | Admitting: Obstetrics and Gynecology

## 2016-09-20 ENCOUNTER — Encounter: Payer: Self-pay | Admitting: Obstetrics and Gynecology

## 2016-09-20 VITALS — BP 110/64 | Wt 186.0 lb

## 2016-09-20 DIAGNOSIS — Z3A38 38 weeks gestation of pregnancy: Secondary | ICD-10-CM

## 2016-09-20 DIAGNOSIS — Z3483 Encounter for supervision of other normal pregnancy, third trimester: Secondary | ICD-10-CM

## 2016-09-20 NOTE — Progress Notes (Signed)
Pt c/o some contractions usually about once a hour, a lot of sharp pains. No vb.cervical check today.

## 2016-09-27 ENCOUNTER — Ambulatory Visit (INDEPENDENT_AMBULATORY_CARE_PROVIDER_SITE_OTHER): Payer: Commercial Managed Care - PPO | Admitting: Advanced Practice Midwife

## 2016-09-27 VITALS — BP 116/68 | Wt 186.0 lb

## 2016-09-27 DIAGNOSIS — Z3A39 39 weeks gestation of pregnancy: Secondary | ICD-10-CM

## 2016-09-27 NOTE — Progress Notes (Addendum)
Had q 10 min ctxs on Tuesday. External Os is 2cm. Internal is FT. Induction scheduled for 4/6 at 8 pm. Pt aware.

## 2016-10-02 ENCOUNTER — Inpatient Hospital Stay: Payer: Commercial Managed Care - PPO | Admitting: Anesthesiology

## 2016-10-02 ENCOUNTER — Encounter: Payer: Self-pay | Admitting: *Deleted

## 2016-10-02 ENCOUNTER — Encounter: Payer: Commercial Managed Care - PPO | Admitting: Obstetrics and Gynecology

## 2016-10-02 ENCOUNTER — Inpatient Hospital Stay
Admission: EM | Admit: 2016-10-02 | Discharge: 2016-10-04 | DRG: 775 | Disposition: A | Discharge status: Home / Self Care | Payer: Commercial Managed Care - PPO | Attending: Obstetrics and Gynecology | Admitting: Obstetrics and Gynecology

## 2016-10-02 DIAGNOSIS — Z3A40 40 weeks gestation of pregnancy: Secondary | ICD-10-CM

## 2016-10-02 DIAGNOSIS — O43123 Velamentous insertion of umbilical cord, third trimester: Secondary | ICD-10-CM | POA: Diagnosis present

## 2016-10-02 DIAGNOSIS — Z3483 Encounter for supervision of other normal pregnancy, third trimester: Secondary | ICD-10-CM

## 2016-10-02 DIAGNOSIS — Z3490 Encounter for supervision of normal pregnancy, unspecified, unspecified trimester: Secondary | ICD-10-CM

## 2016-10-02 DIAGNOSIS — Z8249 Family history of ischemic heart disease and other diseases of the circulatory system: Secondary | ICD-10-CM | POA: Diagnosis not present

## 2016-10-02 DIAGNOSIS — Z3493 Encounter for supervision of normal pregnancy, unspecified, third trimester: Secondary | ICD-10-CM | POA: Diagnosis present

## 2016-10-02 LAB — CBC
HEMATOCRIT: 33.5 % — AB (ref 35.0–47.0)
HEMOGLOBIN: 11.2 g/dL — AB (ref 12.0–16.0)
MCH: 29.4 pg (ref 26.0–34.0)
MCHC: 33.3 g/dL (ref 32.0–36.0)
MCV: 88.1 fL (ref 80.0–100.0)
Platelets: 361 10*3/uL (ref 150–440)
RBC: 3.8 MIL/uL (ref 3.80–5.20)
RDW: 13.2 % (ref 11.5–14.5)
WBC: 20.5 10*3/uL — ABNORMAL HIGH (ref 3.6–11.0)

## 2016-10-02 LAB — TYPE AND SCREEN
ABO/RH(D): O POS
ANTIBODY SCREEN: NEGATIVE

## 2016-10-02 MED ORDER — OXYTOCIN 40 UNITS IN LACTATED RINGERS INFUSION - SIMPLE MED: 2.5000 [IU]/h | INTRAVENOUS | Status: DC

## 2016-10-02 MED ORDER — LIDOCAINE HCL (PF) 1 % IJ SOLN
INTRAMUSCULAR | Status: DC
Start: 2016-10-02 — End: 2016-10-03
  Filled 2016-10-02: qty 30

## 2016-10-02 MED ORDER — NALBUPHINE HCL 10 MG/ML IJ SOLN: 5.0000 mg | Freq: Once | INTRAMUSCULAR | Status: DC | PRN

## 2016-10-02 MED ORDER — ONDANSETRON HCL 4 MG/2ML IJ SOLN
4.0000 mg | Freq: Four times a day (QID) | INTRAMUSCULAR | Status: DC | PRN
  Administered 2016-10-03: 4 mg via INTRAVENOUS
  Filled 2016-10-02: qty 2

## 2016-10-02 MED ORDER — LACTATED RINGERS IV SOLN
INTRAVENOUS | Status: DC
  Administered 2016-10-02 (×2): via INTRAVENOUS

## 2016-10-02 MED ORDER — FENTANYL 2.5 MCG/ML W/ROPIVACAINE 0.2% IN NS 100 ML EPIDURAL INFUSION (ARMC-ANES)
EPIDURAL | Status: AC
  Filled 2016-10-02: qty 100

## 2016-10-02 MED ORDER — LACTATED RINGERS IV SOLN: 500.0000 mL | INTRAVENOUS | Status: DC | PRN

## 2016-10-02 MED ORDER — FENTANYL 2.5 MCG/ML W/ROPIVACAINE 0.2% IN NS 100 ML EPIDURAL INFUSION (ARMC-ANES)
EPIDURAL | Status: DC | PRN
  Administered 2016-10-02: 10 mL/h via EPIDURAL

## 2016-10-02 MED ORDER — FENTANYL 2.5 MCG/ML W/ROPIVACAINE 0.2% IN NS 100 ML EPIDURAL INFUSION (ARMC-ANES): 10.0000 mL/h | EPIDURAL | Status: DC

## 2016-10-02 MED ORDER — OXYTOCIN 10 UNIT/ML IJ SOLN
INTRAMUSCULAR | Status: AC
  Filled 2016-10-02: qty 2

## 2016-10-02 MED ORDER — OXYTOCIN 10 UNIT/ML IJ SOLN: 10.0000 [IU] | Freq: Once | INTRAMUSCULAR | Status: DC

## 2016-10-02 MED ORDER — ONDANSETRON HCL 4 MG/2ML IJ SOLN: 4.0000 mg | Freq: Three times a day (TID) | INTRAMUSCULAR | Status: DC | PRN

## 2016-10-02 MED ORDER — NALOXONE HCL 0.4 MG/ML IJ SOLN: 0.4000 mg | INTRAMUSCULAR | Status: DC | PRN

## 2016-10-02 MED ORDER — AMMONIA AROMATIC IN INHA
RESPIRATORY_TRACT | Status: AC
  Filled 2016-10-02: qty 10

## 2016-10-02 MED ORDER — BUPIVACAINE HCL (PF) 0.25 % IJ SOLN
INTRAMUSCULAR | Status: DC | PRN
  Administered 2016-10-02 (×3): 5 mL via EPIDURAL

## 2016-10-02 MED ORDER — DIPHENHYDRAMINE HCL 50 MG/ML IJ SOLN: 12.5000 mg | INTRAMUSCULAR | Status: DC | PRN

## 2016-10-02 MED ORDER — MISOPROSTOL 200 MCG PO TABS
ORAL_TABLET | ORAL | Status: AC
  Filled 2016-10-02: qty 4

## 2016-10-02 MED ORDER — LIDOCAINE-EPINEPHRINE (PF) 1.5 %-1:200000 IJ SOLN
INTRAMUSCULAR | Status: DC | PRN
  Administered 2016-10-02: 3 mL

## 2016-10-02 MED ORDER — NALBUPHINE HCL 10 MG/ML IJ SOLN: 5.0000 mg | INTRAMUSCULAR | Status: DC | PRN

## 2016-10-02 MED ORDER — SODIUM CHLORIDE 0.9% FLUSH: 3.0000 mL | INTRAVENOUS | Status: DC | PRN

## 2016-10-02 MED ORDER — NALOXONE HCL 2 MG/2ML IJ SOSY
1.0000 ug/kg/h | PREFILLED_SYRINGE | INTRAVENOUS | Status: DC | PRN
  Filled 2016-10-02: qty 2

## 2016-10-02 MED ORDER — DIPHENHYDRAMINE HCL 25 MG PO CAPS: 25.0000 mg | ORAL_CAPSULE | ORAL | Status: DC | PRN

## 2016-10-02 MED ORDER — SOD CITRATE-CITRIC ACID 500-334 MG/5ML PO SOLN
30.0000 mL | ORAL | Status: DC | PRN
  Filled 2016-10-02: qty 30

## 2016-10-02 MED ORDER — LIDOCAINE HCL (PF) 1 % IJ SOLN: 30.0000 mL | INTRAMUSCULAR | Status: DC | PRN

## 2016-10-02 MED ORDER — OXYTOCIN BOLUS FROM INFUSION
500.0000 mL | Freq: Once | INTRAVENOUS | Status: AC
  Administered 2016-10-03: 500 mL via INTRAVENOUS

## 2016-10-02 NOTE — Progress Notes (Signed)
Patient ID: LENAH MESSENGER, female   DOB: 1991/03/17, 25 y.o.   MRN: [id number]  Labor Check  Subj:  Complaints: more comfortable with epidural   Obj:  BP 104/70   Pulse 77   Temp 97.9 F (36.6 C) (Oral)   Resp (!) 22   Ht 5\' 9"  (1.753 m)   Wt 183 lb (83 kg)   LMP 12/25/2015 (Exact Date) Comment: With spotting starting 02/12/16  SpO2 97%   BMI 27.02 kg/m     Cervix: Dilation: 5 / Effacement (%): 90 / Station: -1, -2   AROM: clear fluid Baseline FHR: 130 beats/min   Variability: moderate   Accelerations: present   Decelerations: absent Contractions: present frequency: rare Overall assessment: category 1  A/P: 26 y.o. G2P1001 female at [redacted]w[redacted]d with active labor.  1.  Labor: AROM, clear fluid. Start pitocin if no change in 2 hours  2.  FWB: reassurign, Overall assessment: category 1  3.  GBS neg  4.  Pain: epidural 5.  Recheck: 2 hours/prn   Prentice Docker, MD 10/02/2016 7:52 PM

## 2016-10-02 NOTE — Anesthesia Procedure Notes (Signed)
Epidural Patient location during procedure: OB Start time: 10/02/2016 6:52 PM End time: 10/02/2016 7:08 PM  Staffing Anesthesiologist: Emmie Niemann Performed: anesthesiologist   Preanesthetic Checklist Completed: patient identified, site marked, surgical consent, pre-op evaluation, timeout performed, IV checked, risks and benefits discussed and monitors and equipment checked  Epidural Patient position: sitting Prep: ChloraPrep Patient monitoring: heart rate, continuous pulse ox and blood pressure Approach: midline Location: L3-L4 Injection technique: LOR saline  Needle:  Needle type: Tuohy  Needle gauge: 18 G Needle length: 9 cm and 9 Needle insertion depth: 6.5 cm Catheter type: closed end flexible Catheter size: 20 Guage Catheter at skin depth: 11 cm Test dose: negative (0.125% bupivacaine)  Assessment Events: blood not aspirated, injection not painful, no injection resistance, negative IV test and no paresthesia  Additional Notes   Patient tolerated the insertion well without complications.Reason for block:procedure for pain

## 2016-10-02 NOTE — Anesthesia Preprocedure Evaluation (Signed)
Anesthesia Evaluation  Patient identified by MRN, date of birth, ID band Patient awake    Reviewed: Allergy & Precautions, NPO status , Patient's Chart, lab work & pertinent test results  History of Anesthesia Complications Negative for: history of anesthetic complications  Airway Mallampati: II  TM Distance: >3 FB Neck ROM: Full    Dental no notable dental hx.    Pulmonary neg pulmonary ROS, neg sleep apnea, neg COPD,    breath sounds clear to auscultation- rhonchi (-) wheezing      Cardiovascular Exercise Tolerance: Good (-) hypertension(-) CAD and (-) Past MI  Rhythm:Regular Rate:Normal - Systolic murmurs and - Diastolic murmurs    Neuro/Psych PSYCHIATRIC DISORDERS Anxiety negative neurological ROS     GI/Hepatic negative GI ROS, Neg liver ROS,   Endo/Other  negative endocrine ROSneg diabetes  Renal/GU negative Renal ROS     Musculoskeletal negative musculoskeletal ROS (+)   Abdominal Gravid abdomen  Peds  Hematology negative hematology ROS (+)   Anesthesia Other Findings   Reproductive/Obstetrics (+) Pregnancy                             Anesthesia Physical Anesthesia Plan  ASA: II  Anesthesia Plan: Epidural   Post-op Pain Management:    Induction:   Airway Management Planned:   Additional Equipment:   Intra-op Plan:   Post-operative Plan:   Informed Consent: I have reviewed the patients History and Physical, chart, labs and discussed the procedure including the risks, benefits and alternatives for the proposed anesthesia with the patient or authorized representative who has indicated his/her understanding and acceptance.     Plan Discussed with: Anesthesiologist  Anesthesia Plan Comments: (Plan for epidural for labor, discussed epidural vs spinal vs GA if need for csection)        Lab Results  Component Value Date   WBC 20.5 (H) 10/02/2016   HGB 11.2 (L)  10/02/2016   HCT 33.5 (L) 10/02/2016   MCV 88.1 10/02/2016   PLT 361 10/02/2016    Anesthesia Quick Evaluation

## 2016-10-02 NOTE — OB Triage Note (Signed)
G2P1 presents at [redacted]w[redacted]d w/ c/o leaking fluid and contractions, which have been getting worse since Saturday. Does not recall a time she felt the initial leak, but has noticed clear fluid in her underwear, a constant feeling of wetness. Has had some spotting. +FM.

## 2016-10-03 DIAGNOSIS — Z3A40 40 weeks gestation of pregnancy: Secondary | ICD-10-CM

## 2016-10-03 LAB — RPR: RPR Ser Ql: NONREACTIVE

## 2016-10-03 MED ORDER — ONDANSETRON HCL 4 MG/2ML IJ SOLN: 4.0000 mg | INTRAMUSCULAR | Status: DC | PRN

## 2016-10-03 MED ORDER — ONDANSETRON HCL 4 MG PO TABS: 4.0000 mg | ORAL_TABLET | ORAL | Status: DC | PRN

## 2016-10-03 MED ORDER — BENZOCAINE-MENTHOL 20-0.5 % EX AERO
INHALATION_SPRAY | CUTANEOUS | Status: AC
  Filled 2016-10-03: qty 56

## 2016-10-03 MED ORDER — DIPHENHYDRAMINE HCL 25 MG PO CAPS: 25.0000 mg | ORAL_CAPSULE | Freq: Four times a day (QID) | ORAL | Status: DC | PRN

## 2016-10-03 MED ORDER — BENZOCAINE-MENTHOL 20-0.5 % EX AERO: 1.0000 "application " | INHALATION_SPRAY | CUTANEOUS | Status: DC | PRN

## 2016-10-03 MED ORDER — HYDROCODONE-ACETAMINOPHEN 5-325 MG PO TABS: 1.0000 | ORAL_TABLET | Freq: Four times a day (QID) | ORAL | Status: DC | PRN

## 2016-10-03 MED ORDER — COCONUT OIL OIL
1.0000 "application " | TOPICAL_OIL | Status: DC | PRN
  Administered 2016-10-04: 1 via TOPICAL
  Filled 2016-10-03: qty 120

## 2016-10-03 MED ORDER — WITCH HAZEL-GLYCERIN EX PADS
1.0000 "application " | MEDICATED_PAD | CUTANEOUS | Status: DC | PRN
  Administered 2016-10-03: 1 via TOPICAL
  Filled 2016-10-03: qty 100

## 2016-10-03 MED ORDER — SIMETHICONE 80 MG PO CHEW: 80.0000 mg | CHEWABLE_TABLET | ORAL | Status: DC | PRN

## 2016-10-03 MED ORDER — SENNOSIDES-DOCUSATE SODIUM 8.6-50 MG PO TABS
2.0000 | ORAL_TABLET | ORAL | Status: DC
  Administered 2016-10-04: 2 via ORAL
  Filled 2016-10-03: qty 2

## 2016-10-03 MED ORDER — IBUPROFEN 600 MG PO TABS
600.0000 mg | ORAL_TABLET | Freq: Four times a day (QID) | ORAL | Status: DC
  Administered 2016-10-03 – 2016-10-04 (×5): 600 mg via ORAL
  Filled 2016-10-03 (×4): qty 1

## 2016-10-03 MED ORDER — IBUPROFEN 600 MG PO TABS
ORAL_TABLET | ORAL | Status: AC
  Administered 2016-10-03: 600 mg via ORAL
  Filled 2016-10-03: qty 1

## 2016-10-03 MED ORDER — TERBUTALINE SULFATE 1 MG/ML IJ SOLN: 0.2500 mg | Freq: Once | INTRAMUSCULAR | Status: DC | PRN

## 2016-10-03 MED ORDER — OXYTOCIN 40 UNITS IN LACTATED RINGERS INFUSION - SIMPLE MED
1.0000 m[IU]/min | INTRAVENOUS | Status: DC
  Administered 2016-10-03: 1 m[IU]/min via INTRAVENOUS
  Filled 2016-10-03: qty 1000

## 2016-10-03 MED ORDER — DIBUCAINE 1 % RE OINT: 1.0000 "application " | TOPICAL_OINTMENT | RECTAL | Status: DC | PRN

## 2016-10-03 MED ORDER — FERROUS SULFATE 325 (65 FE) MG PO TABS
325.0000 mg | ORAL_TABLET | Freq: Two times a day (BID) | ORAL | Status: DC
  Administered 2016-10-03 – 2016-10-04 (×3): 325 mg via ORAL
  Filled 2016-10-03 (×3): qty 1

## 2016-10-03 MED ORDER — PRENATAL MULTIVITAMIN CH
1.0000 | ORAL_TABLET | Freq: Every day | ORAL | Status: DC
  Administered 2016-10-03 – 2016-10-04 (×2): 1 via ORAL
  Filled 2016-10-03 (×2): qty 1

## 2016-10-03 MED ORDER — ACETAMINOPHEN 325 MG PO TABS
650.0000 mg | ORAL_TABLET | ORAL | Status: DC | PRN
  Administered 2016-10-03: 650 mg via ORAL
  Filled 2016-10-03: qty 2

## 2016-10-03 NOTE — H&P (Signed)
OB History & Physical   History of Present Illness:  Chief Complaint: contractions  HPI:  Gabrielle [place] is a 26 y.o. G2P1001 female at [redacted]w[redacted]d dated by first trimester ultrasound.  Her pregnancy has been uncomplicated.    She reports contractions.   She denies leakage of fluid.   She denies vaginal bleeding.   She reports fetal movement.    Maternal Medical History:   Past Medical History:  Diagnosis Date  . Breast mass 05/02/2010   lt breast pain  . Dysmenorrhea   . Velamentous insertion of umbilical cord     History reviewed. No pertinent surgical history.  Allergies  Allergen Reactions  . Penicillins Rash    Prior to Admission medications   Medication Sig Start Date End Date Taking? Authorizing Provider  ofloxacin (FLOXIN) 0.3 % otic solution Place 5 drops into both ears daily. Patient not taking: Reported on 10/02/2016 02/28/16   Melody N Shambley, CNM  Prenat-FeCbn-FeAsp-Meth-FA-DHA (PRENATE MINI) 18-0.6-0.4-350 MG CAPS Take 18 mg by mouth daily. 03/02/16   Melody Rockney Ghee, CNM    OB History  Gravida Para Term Preterm AB Living  2 1 1     1   SAB TAB Ectopic Multiple Live Births          1    # Outcome Date GA Lbr Len/2nd Weight Sex Delivery Anes PTL Lv  2 Current           1 Term 03/24/14 [redacted]w[redacted]d  6 lb 8 oz (2.948 kg) F Vag-Spont EPI  LIV     Complications: Velamentous insertion of umbilical cord      Prenatal care site: [facility]  Social History: She  reports that she has never smoked. She has never used smokeless tobacco. She reports that she drinks alcohol. She reports that she does not use drugs.  Family History: family history includes COPD in her maternal grandfather and paternal grandmother; Hypertension in her father.   Review of Systems: Negative x 10 systems reviewed except as noted in the HPI.    Physical Exam:  Vital Signs: BP 123/67   Pulse 94   Temp 97.9 F (36.6 C) (Oral)   Resp 16   Ht 5\' 9"  (1.753 m)   Wt 183 lb (83 kg)   LMP  12/25/2015 (Exact Date) Comment: With spotting starting 02/12/16  SpO2 98%   BMI 27.02 kg/m  Physical Exam  Constitutional: She is oriented to person, place, and time and well-developed, well-nourished, and in no distress. No distress.  HENT:  Head: Normocephalic and atraumatic.  Eyes: Conjunctivae are normal. No scleral icterus.  Neck: Normal range of motion. Neck supple.  Cardiovascular: Normal rate and regular rhythm.  Exam reveals no gallop and no friction rub.   No murmur heard. Pulmonary/Chest: Effort normal and breath sounds normal. No respiratory distress. She has no wheezes. She has no rales.  Abdominal:  Gravid, NT  Genitourinary:  Genitourinary Comments: Cervix 3.0 cm, then 3.5 cm, then 5 cm per RN  Musculoskeletal: Normal range of motion. She exhibits no edema.  Neurological: She is alert and oriented to person, place, and time. No cranial nerve deficit.  Skin: Skin is warm and dry. No rash noted.  Psychiatric: Mood, affect and judgment normal.     Pertinent Results:  Prenatal Labs: Blood type/Rh O positive  Antibody screen negative  Rubella Immune  Varicella Immune    RPR NR  HBsAg negative  HIV negative  GC negative  Chlamydia negative  Genetic  screening declined  1 hour GTT passed  3 hour GTT n/a  GBS negative on 09/13/16   Baseline FHR: 135 beats/min   Variability: moderate   Accelerations: present   Decelerations: absent Contractions: present frequency: 2-3 q 10 minutes (not tracing well) Overall assessment: category 1  Assessment:  Gabrielle [place] is a 26 y.o. G86P1001 female at [redacted]w[redacted]d with active labor.   Plan:  1. Admit to Labor & Delivery  2. CBC, T&S, Clrs, IVF 3. GBS negative.   4. Fetwal well-being: reassuring 5. Expect vaginal delivery   Prentice Docker, MD 10/03/2016 4:27 AM

## 2016-10-03 NOTE — Discharge Summary (Signed)
OB Discharge Summary     Patient Name: Gabrielle [place] DOB: [birth year] MRN: [id number]  Date of admission: 10/02/2016 Delivering MD: Prentice Docker, MD  Date of Delivery: 10/03/2016  Date of discharge: 10/04/2016  Admitting diagnosis: 40 weeks contractions leaking fluid Intrauterine pregnancy: [redacted]w[redacted]d     Secondary diagnosis: None     Discharge diagnosis: Term Pregnancy Delivered                                                                                                Post partum procedures:none  Augmentation: AROM and Pitocin  Complications: None  Hospital course:  Onset of Labor With Vaginal Delivery     26 y.o. yo G2P1001 at [redacted]w[redacted]d was admitted in Active Labor on 10/02/2016. Patient had an uncomplicated labor course as follows:  Membrane Rupture Time/Date: 7:50 PM ,10/02/2016   Intrapartum Procedures: Episiotomy: None [1]                                         Lacerations:  2nd degree [3];Perineal [11]  Patient had a delivery of a Viable infant. 10/03/2016  Information for the patient's newborn:  Shelanda, [place] [[id number]]  Delivery Method: Vag-Spont    Pateint had an uncomplicated postpartum course.  She is ambulating, tolerating a regular diet, passing flatus, and urinating well. Patient is discharged home in stable condition on 10/04/16.   Physical exam  Vitals:   10/03/16 1548 10/03/16 1912 10/03/16 2336 10/04/16 0830  BP: 107/65 119/68 111/69 108/70  Pulse: 80 71 66 76  Resp: 19 20 18 18   Temp: 97.8 F (36.6 C) 98 F (36.7 C) 98.4 F (36.9 C) 97.5 F (36.4 C)  TempSrc: Oral Oral Oral Oral  SpO2: 99%  99% 100%  Weight:      Height:       General: alert, cooperative and no distress Lochia: appropriate Uterine Fundus: firm Incision: Healing well with no significant drainage, Dressing is clean, dry, and intact DVT Evaluation: No evidence of DVT seen on physical exam. No cords or calf tenderness. No significant calf/ankle edema.  Labs: Lab Results   Component Value Date   WBC 22.8 (H) 10/04/2016   HGB 10.6 (L) 10/04/2016   HCT 32.1 (L) 10/04/2016   MCV 88.3 10/04/2016   PLT 290 10/04/2016    Discharge instruction: per After Visit Summary.  Medications:  Allergies as of 10/04/2016      Reactions   Penicillins Rash      Medication List    STOP taking these medications   ofloxacin 0.3 % otic solution Commonly known as:  FLOXIN     TAKE these medications   PRENATE MINI 18-0.6-0.4-350 MG Caps Take 18 mg by mouth daily.       Diet: routine diet  Activity: Advance as tolerated. Pelvic rest for 6 weeks.   Outpatient follow up: Follow-up Information    Prentice Docker, MD Follow up in 6 week(s).   Specialty:  Obstetrics and Gynecology Why:  postpartum follow up and  birth control discussion Contact information: [address] [place] [address] [phone]             Postpartum contraception: Undecided discuss at postpartum visit Rhogam Given postpartum: no Rubella vaccine given postpartum: no Varicella vaccine given postpartum: no TDaP given antepartum or postpartum: antepartum  Newborn Data: Live born female  Birth Weight: 7 lb 13.9 oz (3570 g) APGAR: 8, 9   Baby Feeding: Breast  Disposition:home with mother  SIGNED: Rod Can, CNM

## 2016-10-04 LAB — CBC
HCT: 32.1 % — ABNORMAL LOW (ref 35.0–47.0)
HEMOGLOBIN: 10.6 g/dL — AB (ref 12.0–16.0)
MCH: 29.2 pg (ref 26.0–34.0)
MCHC: 33.1 g/dL (ref 32.0–36.0)
MCV: 88.3 fL (ref 80.0–100.0)
PLATELETS: 290 10*3/uL (ref 150–440)
RBC: 3.64 MIL/uL — AB (ref 3.80–5.20)
RDW: 14 % (ref 11.5–14.5)
WBC: 22.8 10*3/uL — AB (ref 3.6–11.0)

## 2016-10-04 NOTE — Anesthesia Postprocedure Evaluation (Signed)
Anesthesia Post Note  Patient: Gabrielle [place]  Procedure(s) Performed: * No procedures listed *  Patient location during evaluation: Mother Baby Anesthesia Type: Epidural Level of consciousness: awake and alert Pain management: pain level controlled Vital Signs Assessment: post-procedure vital signs reviewed and stable Respiratory status: spontaneous breathing, nonlabored ventilation and respiratory function stable Cardiovascular status: stable Postop Assessment: no headache, no backache and epidural receding Anesthetic complications: no     Last Vitals:  Vitals:   10/03/16 1912 10/03/16 2336  BP: 119/68 111/69  Pulse: 71 66  Resp: 20 18  Temp: 36.7 C 36.9 C    Last Pain:  Vitals:   10/03/16 2336  TempSrc: Oral  PainSc:                  Darlyne Russian

## 2016-10-04 NOTE — Discharge Instructions (Signed)
Breastfeeding Deciding to breastfeed is one of the best choices you can make for you and your baby. A change in hormones during pregnancy causes your breast tissue to grow and increases the number and size of your milk ducts. These hormones also allow proteins, sugars, and fats from your blood supply to make breast milk in your milk-producing glands. Hormones prevent breast milk from being released before your baby is born as well as prompt milk flow after birth. Once breastfeeding has begun, thoughts of your baby, as well as his or her sucking or crying, can stimulate the release of milk from your milk-producing glands. Benefits of breastfeeding For Your Baby  Your first milk (colostrum) helps your baby's digestive system function better.  There are antibodies in your milk that help your baby fight off infections.  Your baby has a lower incidence of asthma, allergies, and sudden infant death syndrome.  The nutrients in breast milk are better for your baby than infant formulas and are designed uniquely for your baby's needs.  Breast milk improves your baby's brain development.  Your baby is less likely to develop other conditions, such as childhood obesity, asthma, or type 2 diabetes mellitus.  For You  Breastfeeding helps to create a very special bond between you and your baby.  Breastfeeding is convenient. Breast milk is always available at the correct temperature and costs nothing.  Breastfeeding helps to burn calories and helps you lose the weight gained during pregnancy.  Breastfeeding makes your uterus contract to its prepregnancy size faster and slows bleeding (lochia) after you give birth.  Breastfeeding helps to lower your risk of developing type 2 diabetes mellitus, osteoporosis, and breast or ovarian cancer later in life.  Signs that your baby is hungry Early Signs of Hunger  Increased alertness or activity.  Stretching.  Movement of the head from side to  side.  Movement of the head and opening of the mouth when the corner of the mouth or cheek is stroked (rooting).  Increased sucking sounds, smacking lips, cooing, sighing, or squeaking.  Hand-to-mouth movements.  Increased sucking of fingers or hands.  Late Signs of Hunger  Fussing.  Intermittent crying.  Extreme Signs of Hunger Signs of extreme hunger will require calming and consoling before your baby will be able to breastfeed successfully. Do not wait for the following signs of extreme hunger to occur before you initiate breastfeeding:  Restlessness.  A loud, strong cry.  Screaming.  Breastfeeding basics Breastfeeding Initiation  Find a comfortable place to sit or lie down, with your neck and back well supported.  Place a pillow or rolled up blanket under your baby to bring him or her to the level of your breast (if you are seated). Nursing pillows are specially designed to help support your arms and your baby while you breastfeed.  Make sure that your baby's abdomen is facing your abdomen.  Gently massage your breast. With your fingertips, massage from your chest wall toward your nipple in a circular motion. This encourages milk flow. You may need to continue this action during the feeding if your milk flows slowly.  Support your breast with 4 fingers underneath and your thumb above your nipple. Make sure your fingers are well away from your nipple and your baby's mouth.  Stroke your baby's lips gently with your finger or nipple.  When your baby's mouth is open wide enough, quickly bring your baby to your breast, placing your entire nipple and as much of the colored area   around your nipple (areola) as possible into your baby's mouth. ? More areola should be visible above your baby's upper lip than below the lower lip. ? Your baby's tongue should be between his or her lower gum and your breast.  Ensure that your baby's mouth is correctly positioned around your nipple  (latched). Your baby's lips should create a seal on your breast and be turned out (everted).  It is common for your baby to suck about 2-3 minutes in order to start the flow of breast milk.  Latching Teaching your baby how to latch on to your breast properly is very important. An improper latch can cause nipple pain and decreased milk supply for you and poor weight gain in your baby. Also, if your baby is not latched onto your nipple properly, he or she may swallow some air during feeding. This can make your baby fussy. Burping your baby when you switch breasts during the feeding can help to get rid of the air. However, teaching your baby to latch on properly is still the best way to prevent fussiness from swallowing air while breastfeeding. Signs that your baby has successfully latched on to your nipple:  Silent tugging or silent sucking, without causing you pain.  Swallowing heard between every 3-4 sucks.  Muscle movement above and in front of his or her ears while sucking.  Signs that your baby has not successfully latched on to nipple:  Sucking sounds or smacking sounds from your baby while breastfeeding.  Nipple pain.  If you think your baby has not latched on correctly, slip your finger into the corner of your baby's mouth to break the suction and place it between your baby's gums. Attempt breastfeeding initiation again. Signs of Successful Breastfeeding Signs from your baby:  A gradual decrease in the number of sucks or complete cessation of sucking.  Falling asleep.  Relaxation of his or her body.  Retention of a small amount of milk in his or her mouth.  Letting go of your breast by himself or herself.  Signs from you:  Breasts that have increased in firmness, weight, and size 1-3 hours after feeding.  Breasts that are softer immediately after breastfeeding.  Increased milk volume, as well as a change in milk consistency and color by the fifth day of  breastfeeding.  Nipples that are not sore, cracked, or bleeding.  Signs That Your Baby is Getting Enough Milk  Wetting at least 1-2 diapers during the first 24 hours after birth.  Wetting at least 5-6 diapers every 24 hours for the first week after birth. The urine should be clear or pale yellow by 5 days after birth.  Wetting 6-8 diapers every 24 hours as your baby continues to grow and develop.  At least 3 stools in a 24-hour period by age 5 days. The stool should be soft and yellow.  At least 3 stools in a 24-hour period by age 7 days. The stool should be seedy and yellow.  No loss of weight greater than 10% of birth weight during the first 3 days of age.  Average weight gain of 4-7 ounces (113-198 g) per week after age 4 days.  Consistent daily weight gain by age 5 days, without weight loss after the age of 2 weeks.  After a feeding, your baby may spit up a small amount. This is common. Breastfeeding frequency and duration Frequent feeding will help you make more milk and can prevent sore nipples and breast engorgement. Breastfeed when   you feel the need to reduce the fullness of your breasts or when your baby shows signs of hunger. This is called "breastfeeding on demand." Avoid introducing a pacifier to your baby while you are working to establish breastfeeding (the first 4-6 weeks after your baby is born). After this time you may choose to use a pacifier. Research has shown that pacifier use during the first year of a baby's life decreases the risk of sudden infant death syndrome (SIDS). Allow your baby to feed on each breast as long as he or she wants. Breastfeed until your baby is finished feeding. When your baby unlatches or falls asleep while feeding from the first breast, offer the second breast. Because newborns are often sleepy in the first few weeks of life, you may need to awaken your baby to get him or her to feed. Breastfeeding times will vary from baby to baby. However,  the following rules can serve as a guide to help you ensure that your baby is properly fed:  Newborns (babies 4 weeks of age or younger) may breastfeed every 1-3 hours.  Newborns should not go longer than 3 hours during the day or 5 hours during the night without breastfeeding.  You should breastfeed your baby a minimum of 8 times in a 24-hour period until you begin to introduce solid foods to your baby at around 6 months of age.  Breast milk pumping Pumping and storing breast milk allows you to ensure that your baby is exclusively fed your breast milk, even at times when you are unable to breastfeed. This is especially important if you are going back to work while you are still breastfeeding or when you are not able to be present during feedings. Your lactation consultant can give you guidelines on how long it is safe to store breast milk. A breast pump is a machine that allows you to pump milk from your breast into a sterile bottle. The pumped breast milk can then be stored in a refrigerator or freezer. Some breast pumps are operated by hand, while others use electricity. Ask your lactation consultant which type will work best for you. Breast pumps can be purchased, but some hospitals and breastfeeding support groups lease breast pumps on a monthly basis. A lactation consultant can teach you how to hand express breast milk, if you prefer not to use a pump. Caring for your breasts while you breastfeed Nipples can become dry, cracked, and sore while breastfeeding. The following recommendations can help keep your breasts moisturized and healthy:  Avoid using soap on your nipples.  Wear a supportive bra. Although not required, special nursing bras and tank tops are designed to allow access to your breasts for breastfeeding without taking off your entire bra or top. Avoid wearing underwire-style bras or extremely tight bras.  Air dry your nipples for 3-4minutes after each feeding.  Use only cotton  bra pads to absorb leaked breast milk. Leaking of breast milk between feedings is normal.  Use lanolin on your nipples after breastfeeding. Lanolin helps to maintain your skin's normal moisture barrier. If you use pure lanolin, you do not need to wash it off before feeding your baby again. Pure lanolin is not toxic to your baby. You may also hand express a few drops of breast milk and gently massage that milk into your nipples and allow the milk to air dry.  In the first few weeks after giving birth, some women experience extremely full breasts (engorgement). Engorgement can make your   breasts feel heavy, warm, and tender to the touch. Engorgement peaks within 3-5 days after you give birth. The following recommendations can help ease engorgement: °· Completely empty your breasts while breastfeeding or pumping. You may want to start by applying warm, moist heat (in the shower or with warm water-soaked hand towels) just before feeding or pumping. This increases circulation and helps the milk flow. If your baby does not completely empty your breasts while breastfeeding, pump any extra milk after he or she is finished. °· Wear a snug bra (nursing or regular) or tank top for 1-2 days to signal your body to slightly decrease milk production. °· Apply ice packs to your breasts, unless this is too uncomfortable for you. °· Make sure that your baby is latched on and positioned properly while breastfeeding. ° °If engorgement persists after 48 hours of following these recommendations, contact your health care provider or a lactation consultant. °Overall health care recommendations while breastfeeding °· Eat healthy foods. Alternate between meals and snacks, eating 3 of each per day. Because what you eat affects your breast milk, some of the foods may make your baby more irritable than usual. Avoid eating these foods if you are sure that they are negatively affecting your baby. °· Drink milk, fruit juice, and water to  satisfy your thirst (about 10 glasses a day). °· Rest often, relax, and continue to take your prenatal vitamins to prevent fatigue, stress, and anemia. °· Continue breast self-awareness checks. °· Avoid chewing and smoking tobacco. Chemicals from cigarettes that pass into breast milk and exposure to secondhand smoke may harm your baby. °· Avoid alcohol and drug use, including marijuana. °Some medicines that may be harmful to your baby can pass through breast milk. It is important to ask your health care provider before taking any medicine, including all over-the-counter and prescription medicine as well as vitamin and herbal supplements. °It is possible to become pregnant while breastfeeding. If birth control is desired, ask your health care provider about options that will be safe for your baby. °Contact a health care provider if: °· You feel like you want to stop breastfeeding or have become frustrated with breastfeeding. °· You have painful breasts or nipples. °· Your nipples are cracked or bleeding. °· Your breasts are red, tender, or warm. °· You have a swollen area on either breast. °· You have a fever or chills. °· You have nausea or vomiting. °· You have drainage other than breast milk from your nipples. °· Your breasts do not become full before feedings by the fifth day after you give birth. °· You feel sad and depressed. °· Your baby is too sleepy to eat well. °· Your baby is having trouble sleeping. °· Your baby is wetting less than 3 diapers in a 24-hour period. °· Your baby has less than 3 stools in a 24-hour period. °· Your baby's skin or the white part of his or her eyes becomes yellow. °· Your baby is not gaining weight by 5 days of age. °Get help right away if: °· Your baby is overly tired (lethargic) and does not want to wake up and feed. °· Your baby develops an unexplained fever. °This information is not intended to replace advice given to you by your health care provider. Make sure you discuss  any questions you have with your health care provider. °Document Released: 06/18/2005 Document Revised: 11/30/2015 Document Reviewed: 12/10/2012 °Elsevier Interactive Patient Education © 2017 [employer] °Vaginal Delivery, Care After °Refer to this   sheet in the next few weeks. These instructions provide you with information about caring for yourself after vaginal delivery. Your health care provider may also give you more specific instructions. Your treatment has been planned according to current medical practices, but problems sometimes occur. Call your health care provider if you have any problems or questions. °What can I expect after the procedure? °After vaginal delivery, it is common to have: °· Some bleeding from your vagina. °· Soreness in your abdomen, your vagina, and the area of skin between your vaginal opening and your anus (perineum). °· Pelvic cramps. °· Fatigue. ° °Follow these instructions at home: °Medicines °· Take over-the-counter and prescription medicines only as told by your health care provider. °· If you were prescribed an antibiotic medicine, take it as told by your health care provider. Do not stop taking the antibiotic until it is finished. °Driving ° °· Do not drive or operate heavy machinery while taking prescription pain medicine. °· Do not drive for 24 hours if you received a sedative. °Lifestyle °· Do not drink alcohol. This is especially important if you are breastfeeding or taking medicine to relieve pain. °· Do not use tobacco products, including cigarettes, chewing tobacco, or e-cigarettes. If you need help quitting, ask your health care provider. °Eating and drinking °· Drink at least 8 eight-ounce glasses of water every day unless you are told not to by your health care provider. If you choose to breastfeed your baby, you may need to drink more water than this. °· Eat high-fiber foods every day. These foods may help prevent or relieve constipation. High-fiber foods  include: °? Whole grain cereals and breads. °? Brown rice. °? Beans. °? Fresh fruits and vegetables. °Activity °· Return to your normal activities as told by your health care provider. Ask your health care provider what activities are safe for you. °· Rest as much as possible. Try to rest or take a nap when your baby is sleeping. °· Do not lift anything that is heavier than your baby or 10 lb (4.5 kg) until your health care provider says that it is safe. °· Talk with your health care provider about when you can engage in sexual activity. This may depend on your: °? Risk of infection. °? Rate of healing. °? Comfort and desire to engage in sexual activity. °Vaginal Care °· If you have an episiotomy or a vaginal tear, check the area every day for signs of infection. Check for: °? More redness, swelling, or pain. °? More fluid or blood. °? Warmth. °? Pus or a bad smell. °· Do not use tampons or douches until your health care provider says this is safe. °· Watch for any blood clots that may pass from your vagina. These may look like clumps of dark red, brown, or black discharge. °General instructions °· Keep your perineum clean and dry as told by your health care provider. °· Wear loose, comfortable clothing. °· Wipe from front to back when you use the toilet. °· Ask your health care provider if you can shower or take a bath. If you had an episiotomy or a perineal tear during labor and delivery, your health care provider may tell you not to take baths for a certain length of time. °· Wear a bra that supports your breasts and fits you well. °· If possible, have someone help you with household activities and help care for your baby for at least a few days after you leave the hospital. °· Keep all   follow-up visits for you and your baby as told by your health care provider. This is important. °Contact a health care provider if: °· You have: °? Vaginal discharge that has a bad smell. °? Difficulty urinating. °? Pain when  urinating. °? A sudden increase or decrease in the frequency of your bowel movements. °? More redness, swelling, or pain around your episiotomy or vaginal tear. °? More fluid or blood coming from your episiotomy or vaginal tear. °? Pus or a bad smell coming from your episiotomy or vaginal tear. °? A fever. °? A rash. °? Little or no interest in activities you used to enjoy. °? Questions about caring for yourself or your baby. °· Your episiotomy or vaginal tear feels warm to the touch. °· Your episiotomy or vaginal tear is separating or does not appear to be healing. °· Your breasts are painful, hard, or turn red. °· You feel unusually sad or worried. °· You feel nauseous or you vomit. °· You pass large blood clots from your vagina. If you pass a blood clot from your vagina, save it to show to your health care provider. Do not flush blood clots down the toilet without having your health care provider look at them. °· You urinate more than usual. °· You are dizzy or light-headed. °· You have not breastfed at all and you have not had a menstrual period for 12 weeks after delivery. °· You have stopped breastfeeding and you have not had a menstrual period for 12 weeks after you stopped breastfeeding. °Get help right away if: °· You have: °? Pain that does not go away or does not get better with medicine. °? Chest pain. °? Difficulty breathing. °? Blurred vision or spots in your vision. °? Thoughts about hurting yourself or your baby. °· You develop pain in your abdomen or in one of your legs. °· You develop a severe headache. °· You faint. °· You bleed from your vagina so much that you fill two sanitary pads in one hour. °This information is not intended to replace advice given to you by your health care provider. Make sure you discuss any questions you have with your health care provider. °Document Released: 06/15/2000 Document Revised: 11/30/2015 Document Reviewed: 07/03/2015 °Elsevier Interactive Patient Education ©  2017 [employer] ° °

## 2016-10-04 NOTE — Progress Notes (Signed)
Parents given the Period of Purple Cry Video to view in room 339 prior to discharge home.  DVD given to them for discharge for other caregivers to view as well.

## 2016-10-05 ENCOUNTER — Encounter: Payer: Self-pay | Admitting: Obstetrics and Gynecology

## 2016-10-05 ENCOUNTER — Telehealth: Payer: Self-pay

## 2016-10-05 ENCOUNTER — Ambulatory Visit (INDEPENDENT_AMBULATORY_CARE_PROVIDER_SITE_OTHER): Payer: Commercial Managed Care - PPO | Admitting: Obstetrics and Gynecology

## 2016-10-05 VITALS — BP 110/70 | HR 85 | Ht 67.0 in | Wt 181.0 lb

## 2016-10-05 DIAGNOSIS — N3000 Acute cystitis without hematuria: Secondary | ICD-10-CM | POA: Diagnosis not present

## 2016-10-05 MED ORDER — SULFAMETHOXAZOLE-TRIMETHOPRIM 800-160 MG PO TABS: 1.0000 | ORAL_TABLET | Freq: Two times a day (BID) | ORAL | 0 refills | Status: AC

## 2016-10-05 NOTE — Progress Notes (Signed)
Obstetrics & Gynecology Office Visit   Chief Complaint  Patient presents with  . Urinary Frequency    History of Present Illness: Urinary Tract Infection: Patient complains of frequency and incontinence She has had symptoms for 2 days. Patient also complains of back pain. Patient denies fever and stomach ache. Patient does not have a history of recurrent UTI.  Patient does not have a history of pyelonephritis.   Review of Systems: Review of Systems  Constitutional: Negative for chills and fever.  Respiratory: Negative for cough and wheezing.   Cardiovascular: Negative for chest pain.  Gastrointestinal: Positive for abdominal pain. Negative for blood in stool, constipation, diarrhea, heartburn, nausea and vomiting.  Genitourinary: Positive for dysuria and frequency.  Psychiatric/Behavioral: Negative for depression.    Past Medical History:  Diagnosis Date  . Breast mass 05/02/2010   lt breast pain  . Dysmenorrhea   . Velamentous insertion of umbilical cord     Past Surgical History: none  Gynecologic History: Patient's last menstrual period was 12/25/2015 (exact date).  Obstetric History: [id number]  Family History  Problem Relation Age of Onset  . Hypertension Father   . COPD Maternal Grandfather   . COPD Paternal Grandmother     Social History   Social History  . Marital status: Married    Spouse name: N/A  . Number of children: N/A  . Years of education: N/A   Occupational History  . Not on file.   Social History Main Topics  . Smoking status: Never Smoker  . Smokeless tobacco: Never Used  . Alcohol use 0.0 oz/week     Comment: On occasion  . Drug use: No  . Sexual activity: Yes    Birth control/ protection: None   Other Topics Concern  . Not on file   Social History Narrative  . No narrative on file   Allergies  Allergen Reactions  . Penicillins Rash    Medications:   Medication Sig Start Date End Date Taking? Authorizing Provider    Prenat-FeCbn-FeAsp-Meth-FA-DHA (PRENATE MINI) 18-0.6-0.4-350 MG CAPS Take 18 mg by mouth daily. 03/02/16   Melody Rockney Ghee, CNM    Physical Exam BP 110/70   Pulse 85   Ht 5\' 7"  (1.702 m)   Wt 181 lb (82.1 kg)   LMP 12/25/2015 (Exact Date) Comment: With spotting starting 02/12/16  Breastfeeding? Unknown   BMI 28.35 kg/m   Patient's last menstrual period was 12/25/2015 (exact date).  General: NAD HEENT: normocephalic, anicteric Pulmonary: No increased work of breathing Abdomen: NABS, soft, mildly tender in suprapubic area, non-distended.  Umbilicus without lesions.   Genitourinary:  External: Normal external female genitalia.  Normal urethral meatus, normal  Bartholin's and Skene's glands.    Vagina: Normal vaginal mucosa, no evidence of prolapse.  Tender anteriorly, not posteriorly  Cervix: Grossly normal in appearance given her recent postpartum status, no CMT  Uterus: enlarged at U-2, mobile, normal contour.  No CMT  Rectal: deferred  Lymphatic: no evidence of inguinal lymphadenopathy Extremities: no edema, erythema, or tenderness Neurologic: Grossly intact Psychiatric: mood appropriate, affect full  Female chaperone present for pelvic and breast  portions of the physical exam  Assessment: 26 y.o. [id number] with acute cystitis with incontinence.  She is unable to void for me today into a sterile specimen container. Also on the differential is fistula (no evidence today and is very recent postpartum so would more likely be a laceration) and damage to pelvic floor structures providing continence support. Will treat presumptively for  UTI given suprapubic tenderness.  Plan: 1. Acute cystitis without hematuria - sulfamethoxazole-trimethoprim (BACTRIM DS,SEPTRA DS) 800-160 MG tablet; Take 1 tablet by mouth 2 (two) times daily.  Dispense: 10 tablet; Refill: 0    f/u for 6 week postpartum in 6 weeks  Prentice Docker, MD 10/05/2016 2:16 PM

## 2016-10-05 NOTE — Telephone Encounter (Signed)
Pt states she delivered 2 days ago & was d/c yesterday. She states everytime she moves she pees everywhere, but when she tries to pee she can't. [phone].

## 2016-10-05 NOTE — Telephone Encounter (Signed)
Pt added to sdj schedule today

## 2016-10-11 ENCOUNTER — Telehealth: Payer: Self-pay

## 2016-10-11 NOTE — Telephone Encounter (Signed)
Pt calling. She delivered Wed. Last week.  Leaking urine c walking.  Finished macrobid. Still leaking but is a little better.  Does she need to be seen again or what to do?

## 2016-10-11 NOTE — Telephone Encounter (Signed)
Does not need to be evaluated at this time, unless things get worse.  If still leaking, I may need to refer her to a urogynecologist for assessment and management.  Please let her know that I would say to keep going as she is right now.  If things don't improve, I will send her to Uro-gyn.  Marland Kitchen

## 2016-10-17 ENCOUNTER — Telehealth: Payer: Self-pay

## 2016-10-17 DIAGNOSIS — R32 Unspecified urinary incontinence: Secondary | ICD-10-CM

## 2016-10-17 NOTE — Telephone Encounter (Signed)
Pt calling.  She is still having bladder leakage after delivering two weeks ago.  SDJ said he would ref her.  She would like the ref.  [phone]

## 2016-10-18 DIAGNOSIS — R32 Unspecified urinary incontinence: Secondary | ICD-10-CM | POA: Insufficient documentation

## 2016-10-19 NOTE — Telephone Encounter (Signed)
Pt is calling and would like a call back from Dr. Glennon Mac.

## 2016-10-23 ENCOUNTER — Telehealth: Payer: Self-pay | Admitting: Obstetrics and Gynecology

## 2016-10-23 NOTE — Telephone Encounter (Signed)
Pt aware via vm I will have SDJ call her back today

## 2016-10-23 NOTE — Telephone Encounter (Signed)
Patient's symptoms improving steadily. Still with leakage.  Not constant, really only with bending or when she "tenses up."  States is having UTI symptoms. Will have her come by to give urine sample. She was treated for UTI 2 weeks ago with bactrim and no resolution of symptoms.  If persists, will need pelvic exam.

## 2016-10-25 ENCOUNTER — Other Ambulatory Visit: Payer: Self-pay | Admitting: Obstetrics and Gynecology

## 2016-10-25 DIAGNOSIS — R300 Dysuria: Secondary | ICD-10-CM

## 2016-10-28 LAB — URINE CULTURE: ORGANISM ID, BACTERIA: NO GROWTH

## 2016-10-29 ENCOUNTER — Encounter: Payer: Self-pay | Admitting: Obstetrics and Gynecology

## 2016-11-22 ENCOUNTER — Encounter: Payer: Self-pay | Admitting: Obstetrics and Gynecology

## 2016-11-22 ENCOUNTER — Ambulatory Visit (INDEPENDENT_AMBULATORY_CARE_PROVIDER_SITE_OTHER): Payer: Commercial Managed Care - PPO | Admitting: Obstetrics and Gynecology

## 2016-11-22 NOTE — Progress Notes (Signed)
  Postpartum Visit  Chief Complaint: 6 week postpartum visit  History of Present Illness: Patient is a 26 y.o. G2P1001 presents for postpartum visit.  Date of delivery: 10/03/2016 Type of delivery: Vaginal delivery  Episiotomy No.  Laceration: yes Pregnancy or labor problems:  no Any problems since the delivery:  No   Newborn Details:  SINGLETON :  1. Baby's name: Makaleigh. Birth weight: 7.14lb Maternal Details:  Breast Feeding:  yes Post partum depression/anxiety noted:  No  Edinburgh Post-Partum Depression Score:  1  Date of last PAP: 07/07/2015/normal  Review of Systems: Review of Systems  Constitutional: Negative.   HENT: Negative.   Eyes: Negative.   Respiratory: Negative.   Cardiovascular: Negative.   Gastrointestinal: Negative.   Genitourinary: Negative.   Musculoskeletal: Negative.   Skin: Negative.   Neurological: Negative.   Psychiatric/Behavioral: Negative.     Past Medical History:  Diagnosis Date  . Breast mass 05/02/2010   lt breast pain  . Dysmenorrhea   . Velamentous insertion of umbilical cord     Past Surgical History: denies  Family History:  Family History  Problem Relation Age of Onset  . Hypertension Father   . COPD Maternal Grandfather   . COPD Paternal Grandmother     Social History   Social History  . Marital status: Married    Spouse name: N/A  . Number of children: N/A  . Years of education: N/A   Occupational History  . Not on file.   Social History Main Topics  . Smoking status: Never Smoker  . Smokeless tobacco: Never Used  . Alcohol use 0.0 oz/week     Comment: On occasion  . Drug use: No  . Sexual activity: Yes    Birth control/ protection: None   Other Topics Concern  . Not on file   Social History Narrative  . No narrative on file    Allergies  Allergen Reactions  . Penicillins Rash    Medications: Prior to Admission medications   Medication Sig Start Date End Date Taking? Authorizing Provider    Prenat-FeCbn-FeAsp-Meth-FA-DHA (PRENATE MINI) 18-0.6-0.4-350 MG CAPS Take 18 mg by mouth daily. 03/02/16   Joylene Igo, CNM    Physical Exam There were no vitals taken for this visit.  OBGyn Exam   Female Chaperone present during breast and/or pelvic exam.  Assessment: 26 y.o. G2P1001 presenting for 6 week postpartum visit  Plan: Problem List Items Addressed This Visit    None       1) Contraception Education given regarding options for contraception, including barrier methods.  2)  Pap - ASCCP guidelines and rational discussed.  Patient opts for routine screening interval  3) Patient underwent screening for postpartum depression with no concerns noted.  4) Follow up 1 year for routine annual exam  Prentice Docker, MD 11/22/2016 4:53 PM

## 2016-11-29 ENCOUNTER — Telehealth: Payer: Self-pay

## 2016-11-29 NOTE — Telephone Encounter (Signed)
Pt's form to return to work filled out and faxed to pt.

## 2017-01-08 ENCOUNTER — Encounter: Payer: Self-pay | Admitting: Family Medicine

## 2017-01-08 NOTE — Telephone Encounter (Signed)
Routing to provider  

## 2017-01-28 ENCOUNTER — Encounter: Payer: Self-pay | Admitting: Family Medicine

## 2017-01-28 ENCOUNTER — Ambulatory Visit (INDEPENDENT_AMBULATORY_CARE_PROVIDER_SITE_OTHER): Payer: Commercial Managed Care - PPO | Admitting: Family Medicine

## 2017-01-28 VITALS — BP 121/74 | HR 64 | Temp 97.9°F | Wt 151.0 lb

## 2017-01-28 DIAGNOSIS — H66001 Acute suppurative otitis media without spontaneous rupture of ear drum, right ear: Secondary | ICD-10-CM | POA: Diagnosis not present

## 2017-01-28 MED ORDER — ERYTHROMYCIN BASE 250 MG PO TABS: 250.0000 mg | ORAL_TABLET | Freq: Two times a day (BID) | ORAL | 0 refills | Status: DC

## 2017-01-28 MED ORDER — FLUTICASONE PROPIONATE 50 MCG/ACT NA SUSP: 2.0000 | Freq: Every day | NASAL | 6 refills | Status: DC

## 2017-01-28 NOTE — Patient Instructions (Signed)
Follow up as needed

## 2017-01-28 NOTE — Progress Notes (Signed)
   BP 121/74   Pulse 64   Temp 97.9 F (36.6 C)   Wt 151 lb (68.5 kg)   LMP 01/25/2017 (Exact Date)   SpO2 99%   BMI 22.30 kg/m    Subjective:    Patient ID: Gabrielle Jackson, female    DOB: 1991-02-18, 26 y.o.   MRN: [id number]  HPI: Gabrielle Jackson is a 26 y.o. female  Chief Complaint  Patient presents with  . Ear Pain    right ear x 1 month, headaches, runny nose, sore throat, nose bleed.   Patient presents with right ear pain x 1 month. Also now having HAs and some swelling and pain behind ear and down neck. Intermittent muffled hearing. Taking tylenol with some relief. Some sore throat but otherwise no symptoms. No hx of allergic rhinitis or frequent ear infections. Is currently breastfeeding her 2 month old.   Relevant past medical, surgical, family and social history reviewed and updated as indicated. Interim medical history since our last visit reviewed. Allergies and medications reviewed and updated.  Review of Systems  Constitutional: Negative.   HENT: Positive for ear pain and sore throat.   Eyes: Negative.   Respiratory: Negative.   Cardiovascular: Negative.   Gastrointestinal: Negative.   Genitourinary: Negative.   Musculoskeletal: Negative.   Neurological: Negative.   Psychiatric/Behavioral: Negative.    Per HPI unless specifically indicated above     Objective:    BP 121/74   Pulse 64   Temp 97.9 F (36.6 C)   Wt 151 lb (68.5 kg)   LMP 01/25/2017 (Exact Date)   SpO2 99%   BMI 22.30 kg/m   Wt Readings from Last 3 Encounters:  01/28/17 151 lb (68.5 kg)  11/22/16 160 lb (72.6 kg)  10/05/16 181 lb (82.1 kg)    Physical Exam  Constitutional: She is oriented to person, place, and time. She appears well-developed and well-nourished.  HENT:  Head: Atraumatic.  Nose: Nose normal.  Mouth/Throat: Oropharynx is clear and moist. No oropharyngeal exudate.  B/l EACs impacted with cerumen, TM not visible in either   Eyes: Pupils are equal, round,  and reactive to light. Conjunctivae are normal.  Neck: Normal range of motion. Neck supple.  Cardiovascular: Normal rate and normal heart sounds.   Pulmonary/Chest: Effort normal and breath sounds normal. No respiratory distress.  Musculoskeletal: Normal range of motion.  Lymphadenopathy:       Head (right side): Posterior auricular adenopathy present.  Neurological: She is alert and oriented to person, place, and time.  Skin: Skin is warm and dry.  Psychiatric: She has a normal mood and affect. Her behavior is normal.  Nursing note and vitals reviewed.     Assessment & Plan:   Problem List Items Addressed This Visit    None    Visit Diagnoses    Acute suppurative otitis media of right ear without spontaneous rupture of tympanic membrane, recurrence not specified    -  Primary   Will avoid lavage given indicators of middle ear infection, erythromycin and flonase sent. Start debrox drops daily x 2 weeks. F/u if no improvement at that tim   Relevant Medications   erythromycin (E-MYCIN) 250 MG tablet       Follow up plan: Return if symptoms worsen or fail to improve.

## 2017-02-04 ENCOUNTER — Ambulatory Visit (INDEPENDENT_AMBULATORY_CARE_PROVIDER_SITE_OTHER): Payer: Commercial Managed Care - PPO | Admitting: Family Medicine

## 2017-02-04 ENCOUNTER — Encounter: Payer: Self-pay | Admitting: Family Medicine

## 2017-02-04 VITALS — BP 128/84 | HR 82 | Temp 98.1°F | Wt 151.6 lb

## 2017-02-04 DIAGNOSIS — F411 Generalized anxiety disorder: Secondary | ICD-10-CM

## 2017-02-04 DIAGNOSIS — M62838 Other muscle spasm: Secondary | ICD-10-CM

## 2017-02-04 MED ORDER — CYCLOBENZAPRINE HCL 10 MG PO TABS: 10.0000 mg | ORAL_TABLET | Freq: Every day | ORAL | 0 refills | Status: DC

## 2017-02-04 MED ORDER — SERTRALINE HCL 50 MG PO TABS: ORAL_TABLET | ORAL | 3 refills | Status: DC

## 2017-02-04 NOTE — Patient Instructions (Addendum)
Cervical Strain and Sprain Rehab Ask your health care provider which exercises are safe for you. Do exercises exactly as told by your health care provider and adjust them as directed. It is normal to feel mild stretching, pulling, tightness, or discomfort as you do these exercises, but you should stop right away if you feel sudden pain or your pain gets worse.Do not begin these exercises until told by your health care provider. Stretching and range of motion exercises These exercises warm up your muscles and joints and improve the movement and flexibility of your neck. These exercises also help to relieve pain, numbness, and tingling. Exercise A: Cervical side bend  1. Using good posture, sit on a stable chair or stand up. 2. Without moving your shoulders, slowly tilt your left / right ear to your shoulder until you feel a stretch in your neck muscles. You should be looking straight ahead. 3. Hold for __________ seconds. 4. Repeat with the other side of your neck. Repeat __________ times. Complete this exercise __________ times a day. Exercise B: Cervical rotation  1. Using good posture, sit on a stable chair or stand up. 2. Slowly turn your head to the side as if you are looking over your left / right shoulder. ? Keep your eyes level with the ground. ? Stop when you feel a stretch along the side and the back of your neck. 3. Hold for __________ seconds. 4. Repeat this by turning to your other side. Repeat __________ times. Complete this exercise __________ times a day. Exercise C: Thoracic extension and pectoral stretch 1. Roll a towel or a small blanket so it is about 4 inches (10 cm) in diameter. 2. Lie down on your back on a firm surface. 3. Put the towel lengthwise, under your spine in the middle of your back. It should not be not under your shoulder blades. The towel should line up with your spine from your middle back to your lower back. 4. Put your hands behind your head and let your  elbows fall out to your sides. 5. Hold for __________ seconds. Repeat __________ times. Complete this exercise __________ times a day. Strengthening exercises These exercises build strength and endurance in your neck. Endurance is the ability to use your muscles for a long time, even after your muscles get tired. Exercise D: Upper cervical flexion, isometric 1. Lie on your back with a thin pillow behind your head and a small rolled-up towel under your neck. 2. Gently tuck your chin toward your chest and nod your head down to look toward your feet. Do not lift your head off the pillow. 3. Hold for __________ seconds. 4. Release the tension slowly. Relax your neck muscles completely before you repeat this exercise. Repeat __________ times. Complete this exercise __________ times a day. Exercise E: Cervical extension, isometric  1. Stand about 6 inches (15 cm) away from a wall, with your back facing the wall. 2. Place a soft object, about 6-8 inches (15-20 cm) in diameter, between the back of your head and the wall. A soft object could be a small pillow, a ball, or a folded towel. 3. Gently tilt your head back and press into the soft object. Keep your jaw and forehead relaxed. 4. Hold for __________ seconds. 5. Release the tension slowly. Relax your neck muscles completely before you repeat this exercise. Repeat __________ times. Complete this exercise __________ times a day. Posture and body mechanics  Body mechanics refers to the movements and positions of   your body while you do your daily activities. Posture is part of body mechanics. Good posture and healthy body mechanics can help to relieve stress in your body's tissues and joints. Good posture means that your spine is in its natural S-curve position (your spine is neutral), your shoulders are pulled back slightly, and your head is not tipped forward. The following are general guidelines for applying improved posture and body mechanics to  your everyday activities. Standing  When standing, keep your spine neutral and keep your feet about hip-width apart. Keep a slight bend in your knees. Your ears, shoulders, and hips should line up.  When you do a task in which you stand in one place for a long time, place one foot up on a stable object that is 2-4 inches (5-10 cm) high, such as a footstool. This helps keep your spine neutral. Sitting   When sitting, keep your spine neutral and your keep feet flat on the floor. Use a footrest, if necessary, and keep your thighs parallel to the floor. Avoid rounding your shoulders, and avoid tilting your head forward.  When working at a desk or a computer, keep your desk at a height where your hands are slightly lower than your elbows. Slide your chair under your desk so you are close enough to maintain good posture.  When working at a computer, place your monitor at a height where you are looking straight ahead and you do not have to tilt your head forward or downward to look at the screen. Resting When lying down and resting, avoid positions that are most painful for you. Try to support your neck in a neutral position. You can use a contour pillow or a small rolled-up towel. Your pillow should support your neck but not push on it. This information is not intended to replace advice given to you by your health care provider. Make sure you discuss any questions you have with your health care provider. Document Released: 06/18/2005 Document Revised: 02/23/2016 Document Reviewed: 05/25/2015 Elsevier Interactive Patient Education  2018 [employer]  

## 2017-02-04 NOTE — Progress Notes (Signed)
BP 128/84 (BP Location: Left Arm, Patient Position: Sitting, Cuff Size: Normal)   Pulse 82   Temp 98.1 F (36.7 C)   Wt 151 lb 9 oz (68.7 kg)   LMP 01/25/2017 (Exact Date)   SpO2 100%   BMI 22.38 kg/m    Subjective:    Patient ID: Gabrielle Jackson, female    DOB: [birth year], 26 y.o.   MRN: [id number]  HPI: Gabrielle Jackson is a 26 y.o. female  Chief Complaint  Patient presents with  . Anxiety   ANXIETY/STRESS Duration:uncontrolled Anxious mood: yes  Excessive worrying: yes Irritability: yes  Sweating: yes Nausea: no Palpitations:no Hyperventilation: no Panic attacks: no Agoraphobia: no  Obscessions/compulsions: no Depressed mood: yes Depression screen [facility] 2/9 02/04/2017 10/04/2015 09/05/2015  Decreased Interest 2 1 1   Down, Depressed, Hopeless 2 2 1   PHQ - 2 Score 4 3 2   Altered sleeping 3 2 -  Tired, decreased energy 3 3 -  Change in appetite 2 3 -  Feeling bad or failure about yourself  2 0 -  Trouble concentrating 1 1 -  Moving slowly or fidgety/restless 1 0 -  Suicidal thoughts 0 0 -  PHQ-9 Score 16 12 -  Difficult doing work/chores Somewhat difficult Somewhat difficult -   Anhedonia: no Weight changes: no Insomnia: yes hard to stay asleep  Hypersomnia: no Fatigue/loss of energy: yes Feelings of worthlessness: yes Feelings of guilt: yes Impaired concentration/indecisiveness: yes Suicidal ideations: no  Crying spells: no Recent Stressors/Life Changes: yes- new baby   Relationship problems: no   Family stress: no     Financial stress: no    Job stress: no    Recent death/loss: no  Neck has also been swelling and hurting. Needs to have wisdom teeth out, but neck is also really bothering her.   Relevant past medical, surgical, family and social history reviewed and updated as indicated. Interim medical history since our last visit reviewed. Allergies and medications reviewed and updated.  Review of Systems  Constitutional: Negative.     Respiratory: Negative.   Cardiovascular: Negative.   Musculoskeletal: Positive for myalgias, neck pain and neck stiffness. Negative for arthralgias, back pain, gait problem and joint swelling.  Psychiatric/Behavioral: Negative.     Per HPI unless specifically indicated above     Objective:    BP 128/84 (BP Location: Left Arm, Patient Position: Sitting, Cuff Size: Normal)   Pulse 82   Temp 98.1 F (36.7 C)   Wt 151 lb 9 oz (68.7 kg)   LMP 01/25/2017 (Exact Date)   SpO2 100%   BMI 22.38 kg/m   Wt Readings from Last 3 Encounters:  02/04/17 151 lb 9 oz (68.7 kg)  01/28/17 151 lb (68.5 kg)  11/22/16 160 lb (72.6 kg)    Physical Exam  Constitutional: She is oriented to person, place, and time. She appears well-developed and well-nourished. No distress.  HENT:  Head: Normocephalic and atraumatic.  Right Ear: Hearing normal.  Left Ear: Hearing normal.  Nose: Nose normal.  Eyes: Conjunctivae and lids are normal. Right eye exhibits no discharge. Left eye exhibits no discharge. No scleral icterus.  Neck:  Tenderness and hypertonicity of R SCM and scalenes  Cardiovascular: Normal rate, regular rhythm, normal heart sounds and intact distal pulses.  Exam reveals no gallop and no friction rub.   No murmur heard. Pulmonary/Chest: Effort normal and breath sounds normal. No respiratory distress. She has no wheezes. She has no rales. She exhibits no tenderness.  Musculoskeletal:  Normal range of motion.  Neurological: She is alert and oriented to person, place, and time.  Skin: Skin is warm, dry and intact. No rash noted. She is not diaphoretic. No erythema. No pallor.  Psychiatric: She has a normal mood and affect. Her speech is normal and behavior is normal. Judgment and thought content normal. Cognition and memory are normal.    Results for orders placed or performed in visit on 10/25/16  Urine culture  Result Value Ref Range   Urine Culture, Routine Final report    Organism ID,  Bacteria No growth       Assessment & Plan:   Problem List Items Addressed This Visit      Other   Anxiety disorder - Primary    Will restart zoloft as she is currently breast feeding. Will monitor closely. Call with any concerns.        Other Visit Diagnoses    Neck muscle spasm       Stretches and warm heat recommended. Rx for flexeril given, Not well studied in breast feeding- advised pumping and dumping if she needs to take it.        Follow up plan: Return in about 4 weeks (around 03/04/2017) for Follow up mood.

## 2017-02-04 NOTE — Assessment & Plan Note (Signed)
Will restart zoloft as she is currently breast feeding. Will monitor closely. Call with any concerns.

## 2017-02-06 ENCOUNTER — Encounter: Payer: Self-pay | Admitting: Family Medicine

## 2017-02-12 ENCOUNTER — Encounter: Payer: Self-pay | Admitting: Family Medicine

## 2017-02-14 ENCOUNTER — Ambulatory Visit: Payer: Commercial Managed Care - PPO | Admitting: Family Medicine

## 2017-02-20 ENCOUNTER — Encounter: Payer: Self-pay | Admitting: Unknown Physician Specialty

## 2017-02-20 ENCOUNTER — Ambulatory Visit (INDEPENDENT_AMBULATORY_CARE_PROVIDER_SITE_OTHER): Payer: Commercial Managed Care - PPO | Admitting: Unknown Physician Specialty

## 2017-02-20 VITALS — BP 114/76 | HR 64 | Temp 98.2°F | Wt 151.2 lb

## 2017-02-20 DIAGNOSIS — J029 Acute pharyngitis, unspecified: Secondary | ICD-10-CM | POA: Diagnosis not present

## 2017-02-20 DIAGNOSIS — J039 Acute tonsillitis, unspecified: Secondary | ICD-10-CM | POA: Diagnosis not present

## 2017-02-20 MED ORDER — AZITHROMYCIN 250 MG PO TABS: ORAL_TABLET | ORAL | 0 refills | Status: DC

## 2017-02-20 NOTE — Progress Notes (Signed)
BP 114/76   Pulse 64   Temp 98.2 F (36.8 C)   Wt 151 lb 3.2 oz (68.6 kg)   LMP 01/25/2017 (Exact Date)   SpO2 99%   BMI 22.33 kg/m    Subjective:    Patient ID: Gabrielle Jackson, female    DOB: [birth year], 26 y.o.   MRN: [id number]  HPI: Gabrielle Jackson is a 26 y.o. female  Chief Complaint  Patient presents with  . Sore Throat    pt states that her throat has been sore for about a week, states it feels like there is a rock under her tonsil   Sore Throat   This is a new problem. The current episode started in the past 7 days. The problem has been gradually worsening. The pain is worse on the right side. There has been no fever. Associated symptoms include ear pain, headaches and trouble swallowing. Pertinent negatives include no coughing. Associated symptoms comments: tired. She has tried acetaminophen for the symptoms.    Relevant past medical, surgical, family and social history reviewed and updated as indicated. Interim medical history since our last visit reviewed. Allergies and medications reviewed and updated.  Review of Systems  HENT: Positive for ear pain and trouble swallowing.   Respiratory: Negative for cough.   Neurological: Positive for headaches.    Per HPI unless specifically indicated above     Objective:    BP 114/76   Pulse 64   Temp 98.2 F (36.8 C)   Wt 151 lb 3.2 oz (68.6 kg)   LMP 01/25/2017 (Exact Date)   SpO2 99%   BMI 22.33 kg/m   Wt Readings from Last 3 Encounters:  02/20/17 151 lb 3.2 oz (68.6 kg)  02/04/17 151 lb 9 oz (68.7 kg)  01/28/17 151 lb (68.5 kg)    Physical Exam  Constitutional: She is oriented to person, place, and time. She appears well-developed and well-nourished. No distress.  HENT:  Head: Normocephalic and atraumatic.  Right Ear: Tympanic membrane and ear canal normal.  Left Ear: Tympanic membrane and ear canal normal.  Nose: Rhinorrhea present. Right sinus exhibits no maxillary sinus tenderness and no  frontal sinus tenderness. Left sinus exhibits no maxillary sinus tenderness and no frontal sinus tenderness.  Mouth/Throat: Mucous membranes are normal. Posterior oropharyngeal edema and posterior oropharyngeal erythema present.  Neck tender with shoddy lymphadenopathy.  Pustular tonsils  Eyes: Conjunctivae and lids are normal. Right eye exhibits no discharge. Left eye exhibits no discharge. No scleral icterus.  Cardiovascular: Normal rate and regular rhythm.   Pulmonary/Chest: Effort normal and breath sounds normal. No respiratory distress.  Abdominal: Normal appearance. There is no splenomegaly or hepatomegaly.  Musculoskeletal: Normal range of motion.  Neurological: She is alert and oriented to person, place, and time.  Skin: Skin is intact. No rash noted. No pallor.  Psychiatric: She has a normal mood and affect. Her behavior is normal. Judgment and thought content normal.    Results for orders placed or performed in visit on 10/25/16  Urine culture  Result Value Ref Range   Urine Culture, Routine Final report    Organism ID, Bacteria No growth       Assessment & Plan:   Problem List Items Addressed This Visit    None    Visit Diagnoses    Sore throat    -  Primary   Pt with negative strep but significant tenderness and lymphadenopathy.  Will rx Z pack.     Relevant  Orders   Rapid strep screen (not at [facility])   Tonsillitis       Pustular tonsils with swelling       Follow up plan: Return if symptoms worsen or fail to improve.

## 2017-02-24 LAB — CULTURE, GROUP A STREP: Strep A Culture: POSITIVE — AB

## 2017-02-24 LAB — RAPID STREP SCREEN (MED CTR MEBANE ONLY): STREP GP A AG, IA W/REFLEX: NEGATIVE

## 2017-02-25 ENCOUNTER — Encounter: Payer: Self-pay | Admitting: Family Medicine

## 2017-02-26 ENCOUNTER — Other Ambulatory Visit: Payer: Self-pay | Admitting: Family Medicine

## 2017-02-26 DIAGNOSIS — R5383 Other fatigue: Secondary | ICD-10-CM

## 2017-02-27 ENCOUNTER — Other Ambulatory Visit: Payer: Commercial Managed Care - PPO

## 2017-03-01 ENCOUNTER — Encounter: Payer: Self-pay | Admitting: Family Medicine

## 2017-03-01 ENCOUNTER — Ambulatory Visit (INDEPENDENT_AMBULATORY_CARE_PROVIDER_SITE_OTHER): Payer: Commercial Managed Care - PPO | Admitting: Family Medicine

## 2017-03-01 VITALS — BP 114/74 | HR 64 | Temp 98.6°F | Wt 150.0 lb

## 2017-03-01 DIAGNOSIS — F411 Generalized anxiety disorder: Secondary | ICD-10-CM

## 2017-03-01 DIAGNOSIS — R5383 Other fatigue: Secondary | ICD-10-CM | POA: Diagnosis not present

## 2017-03-01 MED ORDER — SERTRALINE HCL 50 MG PO TABS: 50.0000 mg | ORAL_TABLET | Freq: Every day | ORAL | 1 refills | Status: DC

## 2017-03-01 NOTE — Assessment & Plan Note (Signed)
Under much better control. Continue current regimen. Continue to monitor. Recheck 6 months.

## 2017-03-01 NOTE — Progress Notes (Signed)
BP 114/74 (BP Location: Left Arm, Patient Position: Sitting, Cuff Size: Normal)   Pulse 64   Temp 98.6 F (37 C)   Wt 150 lb (68 kg)   SpO2 99%   BMI 22.15 kg/m    Subjective:    Patient ID: Gabrielle Jackson, female    DOB: Aug 30, 1990, 26 y.o.   MRN: [id number]  HPI: Gabrielle Jackson is a 26 y.o. female  Chief Complaint  Patient presents with  . Anxiety   ANXIETY/STRESS -doing much better on the zoloft. Feeling good, but it's making her a little irritable. Doesn't want to change anything.  Duration:better Anxious mood: yes  Excessive worrying: no Irritability: yes  Sweating: no Nausea: no Palpitations:no Hyperventilation: no Panic attacks: no Agoraphobia: no  Obscessions/compulsions: no Depressed mood: no Depression screen [facility] 2/9 03/01/2017 02/04/2017 10/04/2015 09/05/2015  Decreased Interest 0 2 1 1   Down, Depressed, Hopeless 0 2 2 1   PHQ - 2 Score 0 4 3 2   Altered sleeping 1 3 2  -  Tired, decreased energy 1 3 3  -  Change in appetite 1 2 3  -  Feeling bad or failure about yourself  0 2 0 -  Trouble concentrating 0 1 1 -  Moving slowly or fidgety/restless 0 1 0 -  Suicidal thoughts 0 0 0 -  PHQ-9 Score 3 16 12  -  Difficult doing work/chores - Somewhat difficult Somewhat difficult -   GAD 7 : Generalized Anxiety Score 03/01/2017 02/04/2017 10/04/2015 09/05/2015  Nervous, Anxious, on Edge 1 3 3 3   Control/stop worrying 1 3 3 3   Worry too much - different things 1 3 3 3   Trouble relaxing 1 3 2 2   Restless 2 2 1 2   Easily annoyed or irritable 0 3 2 3   Afraid - awful might happen 1 3 2 3   Total GAD 7 Score 7 20 16 19   Anxiety Difficulty Somewhat difficult Very difficult Very difficult Somewhat difficult   Anhedonia: no Weight changes: no Insomnia: no   Hypersomnia: no Fatigue/loss of energy: no Feelings of worthlessness: no Feelings of guilt: no Impaired concentration/indecisiveness: no Suicidal ideations: no  Crying spells: no Recent Stressors/Life Changes:  no  Relevant past medical, surgical, family and social history reviewed and updated as indicated. Interim medical history since our last visit reviewed. Allergies and medications reviewed and updated.  Review of Systems  Constitutional: Negative.   Respiratory: Negative.   Cardiovascular: Negative.   Psychiatric/Behavioral: Negative.     Per HPI unless specifically indicated above     Objective:    BP 114/74 (BP Location: Left Arm, Patient Position: Sitting, Cuff Size: Normal)   Pulse 64   Temp 98.6 F (37 C)   Wt 150 lb (68 kg)   SpO2 99%   BMI 22.15 kg/m   Wt Readings from Last 3 Encounters:  03/01/17 150 lb (68 kg)  02/20/17 151 lb 3.2 oz (68.6 kg)  02/04/17 151 lb 9 oz (68.7 kg)    Physical Exam  Constitutional: She is oriented to person, place, and time. She appears well-developed and well-nourished. No distress.  HENT:  Head: Normocephalic and atraumatic.  Right Ear: Hearing normal.  Left Ear: Hearing normal.  Nose: Nose normal.  Eyes: Conjunctivae and lids are normal. Right eye exhibits no discharge. Left eye exhibits no discharge. No scleral icterus.  Cardiovascular: Normal rate, regular rhythm, normal heart sounds and intact distal pulses.  Exam reveals no gallop and no friction rub.   No murmur heard. Pulmonary/Chest:  Effort normal and breath sounds normal. No respiratory distress. She has no wheezes. She has no rales. She exhibits no tenderness.  Musculoskeletal: Normal range of motion.  Neurological: She is alert and oriented to person, place, and time.  Skin: Skin is warm, dry and intact. No rash noted. No erythema. No pallor.  Psychiatric: She has a normal mood and affect. Her speech is normal and behavior is normal. Judgment and thought content normal. Cognition and memory are normal.  Nursing note and vitals reviewed.   Results for orders placed or performed in visit on 02/20/17  Rapid strep screen (not at [facility])  Result Value Ref Range   Strep Gp A  Ag, IA W/Reflex Negative Negative  Culture, Group A Strep  Result Value Ref Range   Strep A Culture Positive (A)       Assessment & Plan:   Problem List Items Addressed This Visit      Other   Anxiety disorder - Primary    Under much better control. Continue current regimen. Continue to monitor. Recheck 6 months.        Other Visit Diagnoses    Other fatigue       Checking labs today. Await results.        Follow up plan: Return in about 6 months (around 08/29/2017) for Physical.

## 2017-03-02 LAB — COMPREHENSIVE METABOLIC PANEL
ALBUMIN: 4.7 g/dL (ref 3.5–5.5)
ALT: 21 IU/L (ref 0–32)
AST: 23 IU/L (ref 0–40)
Albumin/Globulin Ratio: 2 (ref 1.2–2.2)
Alkaline Phosphatase: 115 IU/L (ref 39–117)
BUN/Creatinine Ratio: 13 (ref 9–23)
BUN: 11 mg/dL (ref 6–20)
CALCIUM: 9.8 mg/dL (ref 8.7–10.2)
CHLORIDE: 102 mmol/L (ref 96–106)
CO2: 23 mmol/L (ref 20–29)
CREATININE: 0.84 mg/dL (ref 0.57–1.00)
GFR calc Af Amer: 111 mL/min/{1.73_m2} (ref >59)
GFR calc non Af Amer: 96 mL/min/{1.73_m2} (ref >59)
GLUCOSE: 76 mg/dL (ref 65–99)
Globulin, Total: 2.3 g/dL (ref 1.5–4.5)
Potassium: 4.4 mmol/L (ref 3.5–5.2)
Sodium: 140 mmol/L (ref 134–144)
TOTAL PROTEIN: 7 g/dL (ref 6.0–8.5)

## 2017-03-02 LAB — CBC WITH DIFFERENTIAL/PLATELET
BASOS ABS: 0 10*3/uL (ref 0.0–0.2)
Basos: 0 %
EOS (ABSOLUTE): 0.1 10*3/uL (ref 0.0–0.4)
Eos: 1 %
HEMOGLOBIN: 13.4 g/dL (ref 11.1–15.9)
Hematocrit: 41 % (ref 34.0–46.6)
IMMATURE GRANS (ABS): 0 10*3/uL (ref 0.0–0.1)
IMMATURE GRANULOCYTES: 0 %
LYMPHS: 36 %
Lymphocytes Absolute: 3.2 10*3/uL — ABNORMAL HIGH (ref 0.7–3.1)
MCH: 29.8 pg (ref 26.6–33.0)
MCHC: 32.7 g/dL (ref 31.5–35.7)
MCV: 91 fL (ref 79–97)
MONOCYTES: 9 %
Monocytes Absolute: 0.8 10*3/uL (ref 0.1–0.9)
NEUTROS PCT: 54 %
Neutrophils Absolute: 4.7 10*3/uL (ref 1.4–7.0)
PLATELETS: 308 10*3/uL (ref 150–379)
RBC: 4.49 x10E6/uL (ref 3.77–5.28)
RDW: 13 % (ref 12.3–15.4)
WBC: 8.8 10*3/uL (ref 3.4–10.8)

## 2017-03-02 LAB — TSH: TSH: 0.995 u[IU]/mL (ref 0.450–4.500)

## 2017-03-05 ENCOUNTER — Ambulatory Visit: Payer: Commercial Managed Care - PPO | Admitting: Family Medicine

## 2017-03-07 ENCOUNTER — Encounter: Payer: Self-pay | Admitting: Family Medicine

## 2017-03-08 ENCOUNTER — Other Ambulatory Visit: Payer: Self-pay | Admitting: Family Medicine

## 2017-03-08 MED ORDER — SULFAMETHOXAZOLE-TRIMETHOPRIM 800-160 MG PO TABS: 1.0000 | ORAL_TABLET | Freq: Two times a day (BID) | ORAL | 0 refills | Status: DC

## 2017-03-08 NOTE — Progress Notes (Signed)
rx sent to her pharmacy 

## 2017-03-14 ENCOUNTER — Ambulatory Visit: Payer: Commercial Managed Care - PPO | Admitting: Family Medicine

## 2017-03-20 ENCOUNTER — Ambulatory Visit: Payer: Commercial Managed Care - PPO | Admitting: Family Medicine

## 2017-04-01 ENCOUNTER — Encounter: Payer: Self-pay | Admitting: Family Medicine

## 2017-04-01 ENCOUNTER — Telehealth: Payer: Self-pay | Admitting: Family Medicine

## 2017-04-01 NOTE — Telephone Encounter (Signed)
Patient notified

## 2017-04-01 NOTE — Telephone Encounter (Signed)
Please call and advise her to go to ER. Thanks.

## 2017-04-01 NOTE — Telephone Encounter (Signed)
Routing to provider  

## 2017-04-02 ENCOUNTER — Ambulatory Visit: Payer: Commercial Managed Care - PPO | Admitting: Family Medicine

## 2017-04-03 ENCOUNTER — Encounter: Payer: Self-pay | Admitting: Family Medicine

## 2017-04-03 ENCOUNTER — Emergency Department: Payer: Commercial Managed Care - PPO

## 2017-04-03 ENCOUNTER — Emergency Department
Admission: EM | Admit: 2017-04-03 | Discharge: 2017-04-03 | Disposition: A | Discharge status: Home / Self Care | Payer: Commercial Managed Care - PPO | Attending: Emergency Medicine | Admitting: Emergency Medicine

## 2017-04-03 ENCOUNTER — Encounter: Payer: Self-pay | Admitting: Medical Oncology

## 2017-04-03 ENCOUNTER — Telehealth: Payer: Self-pay | Admitting: Family Medicine

## 2017-04-03 ENCOUNTER — Ambulatory Visit (INDEPENDENT_AMBULATORY_CARE_PROVIDER_SITE_OTHER): Payer: Commercial Managed Care - PPO | Admitting: Family Medicine

## 2017-04-03 ENCOUNTER — Ambulatory Visit: Admission: RE | Admit: 2017-04-03 | Payer: Commercial Managed Care - PPO | Source: Ambulatory Visit

## 2017-04-03 VITALS — BP 100/66 | HR 65 | Temp 98.2°F | Wt 146.0 lb

## 2017-04-03 DIAGNOSIS — R1011 Right upper quadrant pain: Secondary | ICD-10-CM

## 2017-04-03 DIAGNOSIS — R109 Unspecified abdominal pain: Secondary | ICD-10-CM

## 2017-04-03 LAB — CBC
HEMATOCRIT: 41.9 % (ref 35.0–47.0)
HEMOGLOBIN: 14.5 g/dL (ref 12.0–16.0)
MCH: 30.6 pg (ref 26.0–34.0)
MCHC: 34.7 g/dL (ref 32.0–36.0)
MCV: 88.1 fL (ref 80.0–100.0)
Platelets: 294 10*3/uL (ref 150–440)
RBC: 4.76 MIL/uL (ref 3.80–5.20)
RDW: 12.6 % (ref 11.5–14.5)
WBC: 7.5 10*3/uL (ref 3.6–11.0)

## 2017-04-03 LAB — COMPREHENSIVE METABOLIC PANEL
ALBUMIN: 4.5 g/dL (ref 3.5–5.0)
ALT: 21 U/L (ref 14–54)
ANION GAP: 9 (ref 5–15)
AST: 22 U/L (ref 15–41)
Alkaline Phosphatase: 100 U/L (ref 38–126)
BUN: 18 mg/dL (ref 6–20)
CO2: 24 mmol/L (ref 22–32)
Calcium: 9.1 mg/dL (ref 8.9–10.3)
Chloride: 107 mmol/L (ref 101–111)
Creatinine, Ser: 0.8 mg/dL (ref 0.44–1.00)
GFR calc Af Amer: >60 mL/min (ref >60)
GFR calc non Af Amer: >60 mL/min (ref >60)
GLUCOSE: 86 mg/dL (ref 65–99)
POTASSIUM: 3.8 mmol/L (ref 3.5–5.1)
SODIUM: 140 mmol/L (ref 135–145)
Total Bilirubin: 0.7 mg/dL (ref 0.3–1.2)
Total Protein: 8.1 g/dL (ref 6.5–8.1)

## 2017-04-03 LAB — URINALYSIS, COMPLETE (UACMP) WITH MICROSCOPIC
BACTERIA UA: NONE SEEN
Bilirubin Urine: NEGATIVE
Glucose, UA: NEGATIVE mg/dL
KETONES UR: NEGATIVE mg/dL
Leukocytes, UA: NEGATIVE
NITRITE: NEGATIVE
PROTEIN: NEGATIVE mg/dL
Specific Gravity, Urine: 1.026 (ref 1.005–1.030)
pH: 5 (ref 5.0–8.0)

## 2017-04-03 LAB — LIPASE, BLOOD: LIPASE: 26 U/L (ref 11–51)

## 2017-04-03 LAB — POCT PREGNANCY, URINE: PREG TEST UR: NEGATIVE

## 2017-04-03 MED ORDER — FAMOTIDINE 20 MG PO TABS: 20.0000 mg | ORAL_TABLET | Freq: Two times a day (BID) | ORAL | 1 refills | Status: DC

## 2017-04-03 MED ORDER — SUCRALFATE 1 G PO TABS
1.0000 g | ORAL_TABLET | Freq: Three times a day (TID) | ORAL | 1 refills | Status: DC
Start: 2017-04-03 — End: 2017-06-04

## 2017-04-03 MED ORDER — OMEPRAZOLE 40 MG PO CPDR: 40.0000 mg | DELAYED_RELEASE_CAPSULE | Freq: Every day | ORAL | 1 refills | Status: DC

## 2017-04-03 NOTE — Telephone Encounter (Signed)
Routing to provider  

## 2017-04-03 NOTE — Progress Notes (Signed)
BP 100/66   Pulse 65   Temp 98.2 F (36.8 C)   Wt 146 lb (66.2 kg)   LMP 04/01/2017 (Exact Date)   SpO2 98%   Breastfeeding? Yes   BMI 21.56 kg/m    Subjective:    Patient ID: Gabrielle Jackson, female    DOB: 1990-08-14, 26 y.o.   MRN: [id number]  HPI: Gabrielle Jackson is a 26 y.o. female  Chief Complaint  Patient presents with  . Abdominal Pain    x 1 week, stabbing pain in RUQ, worse after eating. Went to UC on Monday and they advised her to go to the ED but she refused. Some nausea but no vomiting or diarrhea.   Patient presents with almost a week of stabbing RUQ pain that becomes much worse after eating. Pain is 9-10/10 after meals and can last hours. Has had some accompanied nausea but no vomiting or diarrhea, fevers, syncope, urinary sxs, CP. Called the office Friday and was told to go to the ER but pt did not want to. Sxs slightly better the past day or so but still present. Has not tried anything OTC. No hx of galbladder issues, severe GERD issus, or other abdominal pathology. No chance of pregnancy per patient.   Relevant past medical, surgical, family and social history reviewed and updated as indicated. Interim medical history since our last visit reviewed. Allergies and medications reviewed and updated.  Review of Systems  Constitutional: Negative.   Respiratory: Negative.   Cardiovascular: Negative.   Gastrointestinal: Positive for abdominal pain and nausea.  Genitourinary: Negative.   Musculoskeletal: Negative.   Neurological: Negative.   Psychiatric/Behavioral: Negative.    Per HPI unless specifically indicated above     Objective:    BP 100/66   Pulse 65   Temp 98.2 F (36.8 C)   Wt 146 lb (66.2 kg)   LMP 04/01/2017 (Exact Date)   SpO2 98%   Breastfeeding? Yes   BMI 21.56 kg/m   Wt Readings from Last 3 Encounters:  04/03/17 146 lb (66.2 kg)  04/03/17 146 lb (66.2 kg)  03/01/17 150 lb (68 kg)    Physical Exam  Constitutional: She is  oriented to person, place, and time. She appears well-developed and well-nourished. No distress.  HENT:  Head: Atraumatic.  Eyes: Pupils are equal, round, and reactive to light. Conjunctivae are normal. No scleral icterus.  Neck: Normal range of motion. Neck supple.  Cardiovascular: Normal rate and normal heart sounds.   Pulmonary/Chest: Effort normal and breath sounds normal.  Abdominal: Soft. Bowel sounds are normal. She exhibits no distension. There is tenderness (RUQ). There is guarding. There is no rebound.  Musculoskeletal: Normal range of motion.  Neurological: She is alert and oriented to person, place, and time.  Skin: Skin is warm and dry.  Psychiatric: She has a normal mood and affect.  Nursing note and vitals reviewed.  Results for orders placed or performed in visit on 03/01/17  CBC with Differential/Platelet  Result Value Ref Range   WBC 8.8 3.4 - 10.8 x10E3/uL   RBC 4.49 3.77 - 5.28 x10E6/uL   Hemoglobin 13.4 11.1 - 15.9 g/dL   Hematocrit 41.0 34.0 - 46.6 %   MCV 91 79 - 97 fL   MCH 29.8 26.6 - 33.0 pg   MCHC 32.7 31.5 - 35.7 g/dL   RDW 13.0 12.3 - 15.4 %   Platelets 308 150 - 379 x10E3/uL   Neutrophils 54 Not Estab. %   Lymphs 36  Not Estab. %   Monocytes 9 Not Estab. %   Eos 1 Not Estab. %   Basos 0 Not Estab. %   Neutrophils Absolute 4.7 1.4 - 7.0 x10E3/uL   Lymphocytes Absolute 3.2 (H) 0.7 - 3.1 x10E3/uL   Monocytes Absolute 0.8 0.1 - 0.9 x10E3/uL   EOS (ABSOLUTE) 0.1 0.0 - 0.4 x10E3/uL   Basophils Absolute 0.0 0.0 - 0.2 x10E3/uL   Immature Granulocytes 0 Not Estab. %   Immature Grans (Abs) 0.0 0.0 - 0.1 x10E3/uL  Comprehensive metabolic panel  Result Value Ref Range   Glucose 76 65 - 99 mg/dL   BUN 11 6 - 20 mg/dL   Creatinine, Ser 0.84 0.57 - 1.00 mg/dL   GFR calc non Af Amer 96 >59 mL/min/1.73   GFR calc Af Amer 111 >59 mL/min/1.73   BUN/Creatinine Ratio 13 9 - 23   Sodium 140 134 - 144 mmol/L   Potassium 4.4 3.5 - 5.2 mmol/L   Chloride 102 96 -  106 mmol/L   CO2 23 20 - 29 mmol/L   Calcium 9.8 8.7 - 10.2 mg/dL   Total Protein 7.0 6.0 - 8.5 g/dL   Albumin 4.7 3.5 - 5.5 g/dL   Globulin, Total 2.3 1.5 - 4.5 g/dL   Albumin/Globulin Ratio 2.0 1.2 - 2.2   Bilirubin Total <0.2 0.0 - 1.2 mg/dL   Alkaline Phosphatase 115 39 - 117 IU/L   AST 23 0 - 40 IU/L   ALT 21 0 - 32 IU/L  TSH  Result Value Ref Range   TSH 0.995 0.450 - 4.500 uIU/mL      Assessment & Plan:   Problem List Items Addressed This Visit    None    Visit Diagnoses    RUQ pain    -  Primary   STAT u/s ordered for RUQ, pt afebrile and pain tolerable at current moment. Strict return precautions to ER given for fever, persistent vomiting, worsening sxs   Relevant Orders   US Abdomen Limited RUQ       Follow up plan: Return if symptoms worsen or fail to improve.

## 2017-04-03 NOTE — ED Triage Notes (Signed)
Pt reports that she began having rt sided upper abd pain that radiates around to flank Friday night. Pt reports pain worsens after eating.

## 2017-04-03 NOTE — ED Notes (Signed)
Patient transported to Ultrasound 

## 2017-04-03 NOTE — Telephone Encounter (Signed)
Called pt, spoke to her about normal u/s and lab findings in ER, will try prilosec and carafate, bland diet. If worsening or no significant improvement over next week or so will consider GI referral.

## 2017-04-03 NOTE — ED Notes (Signed)
Patient stated she ate a [employer] hot dog and had a beef hot dog, with chili, slaw, mustard, ketchup Friday and about an hour later she had pain to right abdomen at lower end of ribe cage, and since then every time she eats, within about an hour she gets a dull achy pain

## 2017-04-03 NOTE — Telephone Encounter (Signed)
Patient's husband would like to speak with provider about patient's issues. Patient 's husband states patient was seen at hospital for ultrasound but hospital is saying that it may just be gas due to nothing coming up in the findings. Patient's husband concerned there may be something more serious going on and would like to speak with provider in regards to what else could be done for patient.  Please Advise.  Thank you

## 2017-04-03 NOTE — ED Provider Notes (Signed)
[facility] Emergency Department Provider Note       Time seen: ----------------------------------------- 11:25 AM on 04/03/2017 -----------------------------------------     I have reviewed the triage vital signs and the nursing notes.   HISTORY   Chief Complaint Flank Pain    HPI Gabrielle Jackson is a 26 y.o. female who presents to the ED for right upper abdominal pain that wraps around the right flank since Friday night. Patient states started after eating specifically fatty foods. Pain is 4 out of 10, nothing made it better. She describes an aching or dull pain. She has never had this happen before.  Past Medical History:  Diagnosis Date  . Breast mass 05/02/2010   lt breast pain  . Dysmenorrhea   . Velamentous insertion of umbilical cord     Patient Active Problem List   Diagnosis Date Noted  . Postpartum care following vaginal delivery 10/04/2016  . Supervision of normal pregnancy 02/16/2016  . Anxiety disorder 09/05/2015  . Partial hamstring tear 05/02/2015    No past surgical history on file.  Allergies Penicillins  Social History Social History  Substance Use Topics  . Smoking status: Never Smoker  . Smokeless tobacco: Never Used  . Alcohol use 0.0 oz/week     Comment: On occasion    Review of Systems Constitutional: Negative for fever. Cardiovascular: Negative for chest pain. Respiratory: Negative for shortness of breath. Gastrointestinal: positive for abdominal pain Genitourinary: Negative for dysuria. Musculoskeletal: Negative for back pain. Skin: Negative for rash. Neurological: Negative for headaches, focal weakness or numbness.  All systems negative/normal/unremarkable except as stated in the HPI  ____________________________________________   PHYSICAL EXAM:  VITAL SIGNS: ED Triage Vitals  Enc Vitals Group     BP 04/03/17 1004 116/70     Pulse Rate 04/03/17 1004 69     Resp 04/03/17 1004 18     Temp  04/03/17 1004 97.7 F (36.5 C)     Temp Source 04/03/17 1004 Oral     SpO2 04/03/17 1004 100 %     Weight --      Height --      Head Circumference --      Peak Flow --      Pain Score 04/03/17 1002 4     Pain Loc --      Pain Edu? --      Excl. in [place]? --     Constitutional: Alert and oriented. Well appearing and in no distress. Eyes: Conjunctivae are normal. Normal extraocular movements. ENT   Head: Normocephalic and atraumatic.   Nose: No congestion/rhinnorhea.   Mouth/Throat: Mucous membranes are moist.   Neck: No stridor. Cardiovascular: Normal rate, regular rhythm. No murmurs, rubs, or gallops. Respiratory: Normal respiratory effort without tachypnea nor retractions. Breath sounds are clear and equal bilaterally. No wheezes/rales/rhonchi. Gastrointestinal: Soft and nontender. Normal bowel sounds Musculoskeletal: Nontender with normal range of motion in extremities. No lower extremity tenderness nor edema. Neurologic:  Normal speech and language. No gross focal neurologic deficits are appreciated.  Skin:  Skin is warm, dry and intact. No rash noted. Psychiatric: Mood and affect are normal. Speech and behavior are normal.  ____________________________________________  ED COURSE:  Pertinent labs & imaging results that were available during my care of the patient were reviewed by me and considered in my medical decision making (see chart for details). Patient presents for abdominal pain, we will assess with labs and imaging as indicated.   Procedures ____________________________________________   LABS (pertinent  positives/negatives)  Labs Reviewed  URINALYSIS, COMPLETE (UACMP) WITH MICROSCOPIC - Abnormal; Notable for the following:       Result Value   Color, Urine YELLOW (*)    APPearance CLEAR (*)    Hgb urine dipstick SMALL (*)    Squamous Epithelial / LPF 0-5 (*)    All other components within normal limits  LIPASE, BLOOD  COMPREHENSIVE METABOLIC  PANEL  CBC  POC URINE PREG, ED  POCT PREGNANCY, URINE    RADIOLOGY  right upper quadrant ultrasound is unremarkable ____________________________________________  DIFFERENTIAL DIAGNOSIS   cholelithiasis, cholecystitis, GERD, peptic ulcer disease, muscle strain, gas pain, renal colic  FINAL ASSESSMENT AND PLAN  flank pain  Plan: Patient's labs and imaging were dictated above. Patient had presented for flank pain of uncertain etiology. ultrasound was negative for biliary colic or cholecystitis and her labs and urine are reassuring. She is stable for outpatient follow-up.   Earleen Newport, MD   Note: This note was generated in part or whole with voice recognition software. Voice recognition is usually quite accurate but there are transcription errors that can and very often do occur. I apologize for any typographical errors that were not detected and corrected.     Earleen Newport, MD 04/03/17 [phone]

## 2017-04-05 ENCOUNTER — Ambulatory Visit: Payer: Commercial Managed Care - PPO | Admitting: Family Medicine

## 2017-04-05 NOTE — Patient Instructions (Signed)
Follow up as needed

## 2017-04-07 ENCOUNTER — Emergency Department
Admission: EM | Admit: 2017-04-07 | Discharge: 2017-04-07 | Disposition: A | Discharge status: Home / Self Care | Payer: Commercial Managed Care - PPO | Attending: Emergency Medicine | Admitting: Emergency Medicine

## 2017-04-07 ENCOUNTER — Emergency Department: Payer: Commercial Managed Care - PPO

## 2017-04-07 DIAGNOSIS — Z880 Allergy status to penicillin: Secondary | ICD-10-CM | POA: Insufficient documentation

## 2017-04-07 DIAGNOSIS — Z23 Encounter for immunization: Secondary | ICD-10-CM | POA: Diagnosis not present

## 2017-04-07 DIAGNOSIS — Y93D9 Activity, other involving arts and handcrafts: Secondary | ICD-10-CM | POA: Insufficient documentation

## 2017-04-07 DIAGNOSIS — Z79899 Other long term (current) drug therapy: Secondary | ICD-10-CM | POA: Diagnosis not present

## 2017-04-07 DIAGNOSIS — W260XXA Contact with knife, initial encounter: Secondary | ICD-10-CM | POA: Diagnosis not present

## 2017-04-07 DIAGNOSIS — S61012A Laceration without foreign body of left thumb without damage to nail, initial encounter: Secondary | ICD-10-CM | POA: Diagnosis present

## 2017-04-07 DIAGNOSIS — Y999 Unspecified external cause status: Secondary | ICD-10-CM | POA: Insufficient documentation

## 2017-04-07 DIAGNOSIS — Y929 Unspecified place or not applicable: Secondary | ICD-10-CM | POA: Diagnosis not present

## 2017-04-07 MED ORDER — CEPHALEXIN 500 MG PO CAPS: 500.0000 mg | ORAL_CAPSULE | Freq: Four times a day (QID) | ORAL | 0 refills | Status: AC

## 2017-04-07 MED ORDER — BUPIVACAINE HCL (PF) 0.25 % IJ SOLN
2.0000 mL | Freq: Once | INTRAMUSCULAR | Status: AC
  Administered 2017-04-07: 2 mL

## 2017-04-07 MED ORDER — ACETAMINOPHEN 500 MG PO TABS
1000.0000 mg | ORAL_TABLET | Freq: Once | ORAL | Status: AC
  Administered 2017-04-07: 1000 mg via ORAL
  Filled 2017-04-07: qty 2

## 2017-04-07 MED ORDER — BUPIVACAINE HCL (PF) 0.25 % IJ SOLN
INTRAMUSCULAR | Status: AC
  Filled 2017-04-07: qty 30

## 2017-04-07 NOTE — ED Notes (Addendum)
Informed by Kyra Manges that she saw pt and family walking out without discharge papers.  Per her information they did not want to wait any longer.  RN had previously explained that there was a very sick pt the doctor and this RN had to tend to immediately.  Pt did not receive abx prescription, was aware was supposed to get it.

## 2017-04-07 NOTE — ED Triage Notes (Signed)
Pt was carving pumpkin with knife, when knife went through left thumb. Bleeding controlled at this time, pain 10/10.

## 2017-04-07 NOTE — ED Notes (Signed)
Pt was seen walking out by other staff.  Left at 1711

## 2017-04-07 NOTE — ED Notes (Addendum)
Husband standing in hallway asking how long it will be. Informed him MD will be in to go over xray results as soon as he is able to review everything.

## 2017-04-07 NOTE — ED Notes (Signed)
Wound dressed with kerlix and finger splint. Husband asking how long it will be. Explained MD would work on paper work and then they would be ready to leave.

## 2017-04-07 NOTE — Discharge Instructions (Signed)
Please seek medical attention for any high fevers, chest pain, shortness of breath, change in behavior, persistent vomiting, bloody stool or any other new or concerning symptoms.  

## 2017-04-07 NOTE — ED Provider Notes (Signed)
[name] Provider Note   ____________________________________________   I have reviewed the triage vital signs and the nursing notes.   HISTORY  Chief Complaint Laceration   History limited by: Not Limited   HPI Gabrielle Jackson is a 26 y.o. female who presents to the emergency department today because of a laceration and pain.   LOCATION:left thumb DURATION:happened roughly 30 minutes prior to presentation TIMING: pain has been constant since then SEVERITY: pain is severe, 10/10 CONTEXT: patient cut herself whilst carving a pumpkin MODIFYING FACTORS: pain is worse with movement ASSOCIATED SYMPTOMS: had some associated bleeding but that has stopped  Per medical record patient has a history of delivery roughly 6 months ago and received tdap antepartum.   Past Medical History:  Diagnosis Date  . Breast mass 05/02/2010   lt breast pain  . Dysmenorrhea   . Velamentous insertion of umbilical cord     Patient Active Problem List   Diagnosis Date Noted  . Postpartum care following vaginal delivery 10/04/2016  . Supervision of normal pregnancy 02/16/2016  . Anxiety disorder 09/05/2015  . Partial hamstring tear 05/02/2015    History reviewed. No pertinent surgical history.  Prior to Admission medications   Medication Sig Start Date End Date Taking? Authorizing Provider  famotidine (PEPCID) 20 MG tablet Take 1 tablet (20 mg total) by mouth 2 (two) times daily. 04/03/17   Earleen Newport, MD  omeprazole (PRILOSEC) 40 MG capsule Take 1 capsule (40 mg total) by mouth daily. 04/03/17   Volney American, PA-C  sertraline (ZOLOFT) 50 MG tablet Take 1 tablet (50 mg total) by mouth daily. Patient not taking: Reported on 04/03/2017 03/01/17   Park Liter P, DO  sucralfate (CARAFATE) 1 g tablet Take 1 tablet (1 g total) by mouth 4 (four) times daily -  with meals and at bedtime. 04/03/17   Volney American, PA-C     Allergies Penicillins  Family History  Problem Relation Age of Onset  . Hypertension Father   . COPD Maternal Grandfather   . COPD Paternal Grandmother     Social History Social History  Substance Use Topics  . Smoking status: Never Smoker  . Smokeless tobacco: Never Used  . Alcohol use 0.0 oz/week     Comment: On occasion    Review of Systems Constitutional: No fever/chills Eyes: No visual changes. ENT: No sore throat. Cardiovascular: Denies chest pain. Respiratory: Denies shortness of breath. Gastrointestinal: No abdominal pain.  No nausea, no vomiting.  No diarrhea.   Genitourinary: Negative for dysuria. Musculoskeletal: positive for right thumb pain Skin: laceration to right thumb Neurological: Negative for headaches, focal weakness or numbness.  ____________________________________________   PHYSICAL EXAM:  VITAL SIGNS: ED Triage Vitals  Enc Vitals Group     BP 04/07/17 1453 116/86     Pulse Rate 04/07/17 1453 70     Resp 04/07/17 1453 16     Temp 04/07/17 1453 97.9 F (36.6 C)     Temp Source 04/07/17 1453 Oral     SpO2 04/07/17 1453 100 %     Weight 04/07/17 1452 146 lb (66.2 kg)     Height 04/07/17 1452 5\' 9"  (1.753 m)   Constitutional: Alert and oriented. Well appearing and in no distress. Eyes: Conjunctivae are normal.  ENT   Head: Normocephalic and atraumatic.   Nose: No congestion/rhinnorhea.   Mouth/Throat: Mucous membranes are moist.   Neck: No stridor. Hematological/Lymphatic/Immunilogical: No cervical lymphadenopathy. Cardiovascular: Normal rate, regular  rhythm.  No murmurs, rubs, or gallops. Respiratory: Normal respiratory effort without tachypnea nor retractions. Breath sounds are clear and equal bilaterally. No wheezes/rales/rhonchi. Gastrointestinal: Soft and non tender. No rebound. No guarding.  Genitourinary: Deferred Musculoskeletal: left thumb with two less than 1 cm linear lacerations to the outside aspect. No  active bleeding. 2 point discrimination over thumb pad normal. Able to move thumb in all axis. ROM limited secondary to pain.  Neurologic:  Normal speech and language. No gross focal neurologic deficits are appreciated.  Skin:  Skin is warm, dry and intact. No rash noted. Psychiatric: Mood and affect are normal. Speech and behavior are normal. Patient exhibits appropriate insight and judgment.  ____________________________________________    LABS (pertinent positives/negatives)  None  ____________________________________________   EKG  None  ____________________________________________    RADIOLOGY  Left thumb No acute fracture  I, Wilmore Holsomback, personally viewed and evaluated these images (plain radiographs) as part of my medical decision making. ____________________________________________   PROCEDURES  Procedures  NERVE BLOCK Performed by: Nance Pear Consent: Verbal consent obtained. Required items: required blood products, implants, devices, and special equipment available  Indication: pain control Nerve block body site: left thumb  Preparation: Patient was prepped and draped in the usual sterile fashion. Needle gauge: 72 G Location technique: anatomical landmarks  Local anesthetic: 0.25 % bupivacaine without epi  Anesthetic total: 2 ml  Outcome: pain improved Patient tolerance: Patient tolerated the procedure well with no immediate complications.  ____________________________________________   INITIAL IMPRESSION / ASSESSMENT AND PLAN / ED COURSE  Pertinent labs & imaging results that were available during my care of the patient were reviewed by me and considered in my medical decision making (see chart for details).  Patient presented to the emergency department today after a laceration. ddx includes arterial injury, tendon injury, osseous injury. Will get x-ray to evaluate for bony involvement. On exam patient has good two point  discrimination. Patient able to move thumb on all axis with ROM limited due to significant pain.   ----------------------------------------- 4:56 PM on 04/07/2017 -----------------------------------------  X-ray without osseous injury. Nerve block was performed and wounds cleaned. Two small linear lacerations neither of which required advanced closure. Given nature of injury will start patient on prophylactic antibiotics. Discussed strict infection return precautions. Will give patient orthopedic follow up information.  Discussed with patient/family results of testing/physical exam, differential plan and return precautions.  ____________________________________________   FINAL CLINICAL IMPRESSION(S) / ED DIAGNOSES  Final diagnoses:  Laceration of left thumb without damage to nail, foreign body presence unspecified, initial encounter     Note: This dictation was prepared with Dragon dictation. Any transcriptional errors that result from this process are unintentional     Nance Pear, MD 04/07/17 1726

## 2017-06-04 ENCOUNTER — Encounter: Payer: Self-pay | Admitting: Family Medicine

## 2017-06-04 ENCOUNTER — Ambulatory Visit (INDEPENDENT_AMBULATORY_CARE_PROVIDER_SITE_OTHER): Payer: Commercial Managed Care - PPO | Admitting: Family Medicine

## 2017-06-04 VITALS — BP 97/63 | HR 79 | Temp 98.6°F | Ht 67.0 in | Wt 146.7 lb

## 2017-06-04 DIAGNOSIS — R1011 Right upper quadrant pain: Secondary | ICD-10-CM | POA: Diagnosis not present

## 2017-06-04 MED ORDER — CYCLOBENZAPRINE HCL 10 MG PO TABS: 10.0000 mg | ORAL_TABLET | Freq: Three times a day (TID) | ORAL | 0 refills | Status: DC | PRN

## 2017-06-04 NOTE — Progress Notes (Signed)
BP 97/63 (BP Location: Right Arm, Patient Position: Sitting, Cuff Size: Normal)   Pulse 79   Temp 98.6 F (37 C) (Oral)   Ht 5\' 7"  (1.702 m)   Wt 146 lb 11.2 oz (66.5 kg)   SpO2 99%   BMI 22.98 kg/m    Subjective:    Patient ID: Gabrielle Jackson, female    DOB: Nov 21, 1990, 26 y.o.   MRN: [id number]  HPI: Gabrielle Jackson is a 26 y.o. female  Chief Complaint  Patient presents with  . Chest Pain    Left side. Pt states it's swollen. Starts in the front and radiates to the back. x's 3 months. Patient states it's getting worse.  . Back Pain   Patient presents today for follow up of persistent RUQ pain. Recent ER visit with normal RUQ u/s and comprehensive labs, including lipase and LFTs. Taking tylenol with minimal relief. Pain worse with movement and lifting. Does carry carseat in that arm and picks her two kids up with that arm. Denies N/V/D, fevers, bruising to area. Still breastfeeding so trying not to take lots of medicines.   Relevant past medical, surgical, family and social history reviewed and updated as indicated. Interim medical history since our last visit reviewed. Allergies and medications reviewed and updated.  Review of Systems  Constitutional: Negative.   HENT: Negative.   Eyes: Negative.   Respiratory: Negative.   Cardiovascular: Negative.   Gastrointestinal: Positive for abdominal pain. Negative for diarrhea and nausea.  Genitourinary: Negative.   Musculoskeletal: Negative.   Skin: Negative.   Neurological: Negative.   Psychiatric/Behavioral: Negative.    Per HPI unless specifically indicated above     Objective:    BP 97/63 (BP Location: Right Arm, Patient Position: Sitting, Cuff Size: Normal)   Pulse 79   Temp 98.6 F (37 C) (Oral)   Ht 5\' 7"  (1.702 m)   Wt 146 lb 11.2 oz (66.5 kg)   SpO2 99%   BMI 22.98 kg/m   Wt Readings from Last 3 Encounters:  06/04/17 146 lb 11.2 oz (66.5 kg)  04/07/17 146 lb (66.2 kg)  04/03/17 146 lb (66.2 kg)      Physical Exam  Constitutional: She is oriented to person, place, and time. She appears well-developed and well-nourished. No distress.  HENT:  Head: Atraumatic.  Eyes: Conjunctivae are normal. Pupils are equal, round, and reactive to light. No scleral icterus.  Neck: Normal range of motion. Neck supple.  Cardiovascular: Normal rate, regular rhythm and normal heart sounds.  Pulmonary/Chest: Effort normal and breath sounds normal. No respiratory distress.  Abdominal: Soft. Bowel sounds are normal. She exhibits no distension. There is tenderness (RUQ, epigastric). There is no guarding.  Musculoskeletal: Normal range of motion.  Neurological: She is alert and oriented to person, place, and time. No cranial nerve deficit.  Skin: Skin is warm and dry. No rash noted.  Psychiatric: She has a normal mood and affect. Her behavior is normal. Judgment normal.  Nursing note and vitals reviewed.  Results for orders placed or performed during the hospital encounter of 04/03/17  Lipase, blood  Result Value Ref Range   Lipase 26 11 - 51 U/L  Comprehensive metabolic panel  Result Value Ref Range   Sodium 140 135 - 145 mmol/L   Potassium 3.8 3.5 - 5.1 mmol/L   Chloride 107 101 - 111 mmol/L   CO2 24 22 - 32 mmol/L   Glucose, Bld 86 65 - 99 mg/dL   BUN 18  6 - 20 mg/dL   Creatinine, Ser 0.80 0.44 - 1.00 mg/dL   Calcium 9.1 8.9 - 10.3 mg/dL   Total Protein 8.1 6.5 - 8.1 g/dL   Albumin 4.5 3.5 - 5.0 g/dL   AST 22 15 - 41 U/L   ALT 21 14 - 54 U/L   Alkaline Phosphatase 100 38 - 126 U/L   Total Bilirubin 0.7 0.3 - 1.2 mg/dL   GFR calc non Af Amer >60 >60 mL/min   GFR calc Af Amer >60 >60 mL/min   Anion gap 9 5 - 15  CBC  Result Value Ref Range   WBC 7.5 3.6 - 11.0 K/uL   RBC 4.76 3.80 - 5.20 MIL/uL   Hemoglobin 14.5 12.0 - 16.0 g/dL   HCT 41.9 35.0 - 47.0 %   MCV 88.1 80.0 - 100.0 fL   MCH 30.6 26.0 - 34.0 pg   MCHC 34.7 32.0 - 36.0 g/dL   RDW 12.6 11.5 - 14.5 %   Platelets 294 150 - 440  K/uL  Urinalysis, Complete w Microscopic  Result Value Ref Range   Color, Urine YELLOW (A) YELLOW   APPearance CLEAR (A) CLEAR   Specific Gravity, Urine 1.026 1.005 - 1.030   pH 5.0 5.0 - 8.0   Glucose, UA NEGATIVE NEGATIVE mg/dL   Hgb urine dipstick SMALL (A) NEGATIVE   Bilirubin Urine NEGATIVE NEGATIVE   Ketones, ur NEGATIVE NEGATIVE mg/dL   Protein, ur NEGATIVE NEGATIVE mg/dL   Nitrite NEGATIVE NEGATIVE   Leukocytes, UA NEGATIVE NEGATIVE   RBC / HPF 0-5 0 - 5 RBC/hpf   WBC, UA 0-5 0 - 5 WBC/hpf   Bacteria, UA NONE SEEN NONE SEEN   Squamous Epithelial / LPF 0-5 (A) NONE SEEN   Mucus PRESENT   Pregnancy, urine POC  Result Value Ref Range   Preg Test, Ur NEGATIVE NEGATIVE      Assessment & Plan:   Problem List Items Addressed This Visit    None    Visit Diagnoses    RUQ pain    -  Primary   Suspect muscular at this point, encouraged core exercises, flexeril prn, muscle rubs, heat pads, alt arms for carrying. F/u if worsening or no improvement       Follow up plan: Return if symptoms worsen or fail to improve.

## 2017-06-06 NOTE — Patient Instructions (Signed)
Follow up as needed

## 2017-06-14 ENCOUNTER — Ambulatory Visit: Payer: Self-pay | Admitting: Unknown Physician Specialty

## 2017-06-14 ENCOUNTER — Encounter: Payer: Self-pay | Admitting: Family Medicine

## 2017-06-14 ENCOUNTER — Ambulatory Visit (INDEPENDENT_AMBULATORY_CARE_PROVIDER_SITE_OTHER): Payer: Commercial Managed Care - PPO | Admitting: Family Medicine

## 2017-06-14 VITALS — BP 102/88 | HR 78 | Temp 98.0°F | Ht 67.0 in | Wt 147.8 lb

## 2017-06-14 DIAGNOSIS — J069 Acute upper respiratory infection, unspecified: Secondary | ICD-10-CM | POA: Diagnosis not present

## 2017-06-14 NOTE — Progress Notes (Signed)
   BP 102/88 (BP Location: Right Arm, Patient Position: Sitting, Cuff Size: Normal)   Pulse 78   Temp 98 F (36.7 C) (Oral)   Ht 5\' 7"  (1.702 m)   Wt 147 lb 12.8 oz (67 kg)   SpO2 98%   BMI 23.15 kg/m    Subjective:    Patient ID: Gabrielle Jackson, female    DOB: 1991/01/18, 26 y.o.   MRN: [id number]  HPI: JAIDIN UGARTE is a 26 y.o. female  Chief Complaint  Patient presents with  . Sore Throat    Patient states throat feels very full. x's 2 days.   . Nasal Congestion   Patient here with throat "fullness" and some mild congestion for about 2 days. Denies sharp throat pain or difficulty swallowing, fevers, chills, N/V, ear pain, cough. Has not been trying anything OTC for sxs. Lots of strep going around her daughter's school so wanted to make sure that wasn't what was going on.   Relevant past medical, surgical, family and social history reviewed and updated as indicated. Interim medical history since our last visit reviewed. Allergies and medications reviewed and updated.  Review of Systems  Constitutional: Negative.   HENT: Positive for congestion and sore throat.   Eyes: Negative.   Respiratory: Negative.   Cardiovascular: Negative.   Gastrointestinal: Negative.   Genitourinary: Negative.   Musculoskeletal: Negative.   Skin: Negative.   Neurological: Negative.   Psychiatric/Behavioral: Negative.    Per HPI unless specifically indicated above     Objective:    BP 102/88 (BP Location: Right Arm, Patient Position: Sitting, Cuff Size: Normal)   Pulse 78   Temp 98 F (36.7 C) (Oral)   Ht 5\' 7"  (1.702 m)   Wt 147 lb 12.8 oz (67 kg)   SpO2 98%   BMI 23.15 kg/m   Wt Readings from Last 3 Encounters:  06/14/17 147 lb 12.8 oz (67 kg)  06/04/17 146 lb 11.2 oz (66.5 kg)  04/07/17 146 lb (66.2 kg)    Physical Exam  Constitutional: She is oriented to person, place, and time. She appears well-developed and well-nourished. No distress.  HENT:  Head: Atraumatic.    Right Ear: External ear normal.  Left Ear: External ear normal.  Mild b/l tonsillar edema, oropharynx mildly erythematous. No exudates noted.  Nares injected  Eyes: Conjunctivae are normal. Pupils are equal, round, and reactive to light. No scleral icterus.  Neck: Normal range of motion. Neck supple.  Cardiovascular: Normal rate and normal heart sounds.  Pulmonary/Chest: Effort normal and breath sounds normal. No respiratory distress. She has no wheezes.  Musculoskeletal: Normal range of motion.  Lymphadenopathy:    She has no cervical adenopathy.  Neurological: She is alert and oriented to person, place, and time.  Skin: Skin is warm and dry. No rash noted.  Psychiatric: She has a normal mood and affect. Her behavior is normal.  Nursing note and vitals reviewed.     Assessment & Plan:   Problem List Items Addressed This Visit    None    Visit Diagnoses    Viral URI    -  Primary   Suspect viral illness coming on, rapid strep neg - await cx. Sudafed, flonase, zyrtec, throat spra for symptomatic relief. OTC pain relievers prn   Relevant Orders   Rapid Strep Screen (Not at [facility]) (Completed)       Follow up plan: Return if symptoms worsen or fail to improve.

## 2017-06-17 LAB — RAPID STREP SCREEN (MED CTR MEBANE ONLY): STREP GP A AG, IA W/REFLEX: NEGATIVE

## 2017-06-17 LAB — CULTURE, GROUP A STREP: Strep A Culture: NEGATIVE

## 2017-06-17 NOTE — Patient Instructions (Signed)
Follow up as needed

## 2017-06-18 ENCOUNTER — Encounter: Payer: Self-pay | Admitting: Family Medicine

## 2017-06-28 ENCOUNTER — Encounter: Payer: Self-pay | Admitting: Unknown Physician Specialty

## 2017-06-28 ENCOUNTER — Ambulatory Visit: Payer: Commercial Managed Care - PPO | Admitting: Unknown Physician Specialty

## 2017-06-28 VITALS — BP 106/72 | HR 74 | Temp 98.3°F | Wt 149.0 lb

## 2017-06-28 DIAGNOSIS — J069 Acute upper respiratory infection, unspecified: Secondary | ICD-10-CM

## 2017-06-28 DIAGNOSIS — R059 Cough, unspecified: Secondary | ICD-10-CM

## 2017-06-28 DIAGNOSIS — R05 Cough: Secondary | ICD-10-CM

## 2017-06-28 DIAGNOSIS — J029 Acute pharyngitis, unspecified: Secondary | ICD-10-CM | POA: Diagnosis not present

## 2017-06-28 LAB — VERITOR FLU A/B WAIVED
Influenza A: NEGATIVE
Influenza B: NEGATIVE

## 2017-06-28 NOTE — Progress Notes (Signed)
BP 106/72   Pulse 74   Temp 98.3 F (36.8 C) (Oral)   Wt 149 lb (67.6 kg)   SpO2 99%   BMI 23.34 kg/m    Subjective:    Patient ID: Gabrielle Jackson, female    DOB: 25-Nov-1990, 26 y.o.   MRN: [id number]  HPI: Gabrielle Jackson is a 26 y.o. female  Chief Complaint  Patient presents with  . URI    pt states she has had congestion, cough, and ear pain for 2 days    URI   This is a new problem. Episode onset: 3 days runny nose but hit hard yesterday. There has been no fever. Associated symptoms include congestion, coughing, ear pain, rhinorrhea, sneezing and a sore throat. Pertinent negatives include no abdominal pain, chest pain, diarrhea, dysuria, headaches, joint pain, joint swelling, nausea, neck pain, plugged ear sensation, rash, swollen glands, vomiting or wheezing.     Relevant past medical, surgical, family and social history reviewed and updated as indicated. Interim medical history since our last visit reviewed. Allergies and medications reviewed and updated.  Review of Systems  HENT: Positive for congestion, ear pain, rhinorrhea, sneezing and sore throat.   Respiratory: Positive for cough. Negative for wheezing.   Cardiovascular: Negative for chest pain.  Gastrointestinal: Negative for abdominal pain, diarrhea, nausea and vomiting.  Genitourinary: Negative for dysuria.  Musculoskeletal: Negative for joint pain and neck pain.  Skin: Negative for rash.  Neurological: Negative for headaches.    Per HPI unless specifically indicated above     Objective:    BP 106/72   Pulse 74   Temp 98.3 F (36.8 C) (Oral)   Wt 149 lb (67.6 kg)   SpO2 99%   BMI 23.34 kg/m   Wt Readings from Last 3 Encounters:  06/28/17 149 lb (67.6 kg)  06/14/17 147 lb 12.8 oz (67 kg)  06/04/17 146 lb 11.2 oz (66.5 kg)    Physical Exam  Constitutional: She is oriented to person, place, and time. She appears well-developed and well-nourished. No distress.  HENT:  Head:  Normocephalic and atraumatic.  Right Ear: Tympanic membrane and ear canal normal.  Left Ear: Tympanic membrane and ear canal normal.  Nose: Rhinorrhea present. Right sinus exhibits no maxillary sinus tenderness and no frontal sinus tenderness. Left sinus exhibits no maxillary sinus tenderness and no frontal sinus tenderness.  Mouth/Throat: Mucous membranes are normal. Posterior oropharyngeal erythema present.  Eyes: Conjunctivae and lids are normal. Right eye exhibits no discharge. Left eye exhibits no discharge. No scleral icterus.  Cardiovascular: Normal rate and regular rhythm.  Pulmonary/Chest: Effort normal and breath sounds normal. No respiratory distress.  Abdominal: Normal appearance. There is no splenomegaly or hepatomegaly.  Musculoskeletal: Normal range of motion.  Neurological: She is alert and oriented to person, place, and time.  Skin: Skin is intact. No rash noted. No pallor.  Psychiatric: She has a normal mood and affect. Her behavior is normal. Judgment and thought content normal.    Results for orders placed or performed in visit on 06/28/17  Veritor Flu A/B Waived  Result Value Ref Range   Influenza A Negative Negative   Influenza B Negative Negative      Assessment & Plan:   Problem List Items Addressed This Visit    None    Visit Diagnoses    Cough    -  Primary   discussed OTC cough suppresant.     Relevant Orders   Veritor Flu A/B Waived (Completed)  Sore throat       Relevant Orders   Rapid Strep Screen (Not at [facility])   Viral upper respiratory tract infection       Pt ed on supportive care.  Salt water gargles, NSAIDs.  Saline nasal rinses.  Lots of rest.  Does not want note for work       Follow up plan: Return if symptoms worsen or fail to improve.

## 2017-07-04 LAB — CULTURE, GROUP A STREP

## 2017-07-04 LAB — RAPID STREP SCREEN (MED CTR MEBANE ONLY): STREP GP A AG, IA W/REFLEX: NEGATIVE

## 2017-07-05 ENCOUNTER — Telehealth: Payer: Self-pay | Admitting: Unknown Physician Specialty

## 2017-07-05 ENCOUNTER — Ambulatory Visit: Payer: Commercial Managed Care - PPO | Admitting: Family Medicine

## 2017-07-05 ENCOUNTER — Telehealth: Payer: Self-pay | Admitting: Family Medicine

## 2017-07-05 NOTE — Telephone Encounter (Signed)
Discussed with pt that strep culture never made it to [facility] for confirmation.  Pt is feeling "a lot" better

## 2017-07-05 NOTE — Telephone Encounter (Signed)
Copied from [place] [id number]. Topic: Quick Communication - Appointment Cancellation >> Jul 05, 2017  8:38 AM Hewitt Shorts wrote: Patient called to cancel appointment scheduled for1/4/19. 930 Patient has not rescheduled their appointment.    Route to department's PEC pool.

## 2017-07-05 NOTE — Telephone Encounter (Signed)
Sent to you in case there is some reason that you may need to know

## 2017-07-08 ENCOUNTER — Encounter: Payer: Self-pay | Admitting: Family Medicine

## 2017-07-10 ENCOUNTER — Ambulatory Visit: Payer: Commercial Managed Care - PPO | Admitting: Family Medicine

## 2017-07-14 IMAGING — US US ABDOMEN LIMITED
1 series · 14 of 25 positions shown · non-contrast
Comparison: None.

CLINICAL DATA: Right upper quadrant tenderness, worse with eating
for 1 week

EXAM:
US ABDOMEN LIMITED - RIGHT UPPER QUADRANT

[Series 1: us abdomen limited · 0.19mm/px · 14 of 50 slices shown]
[im 1/50]
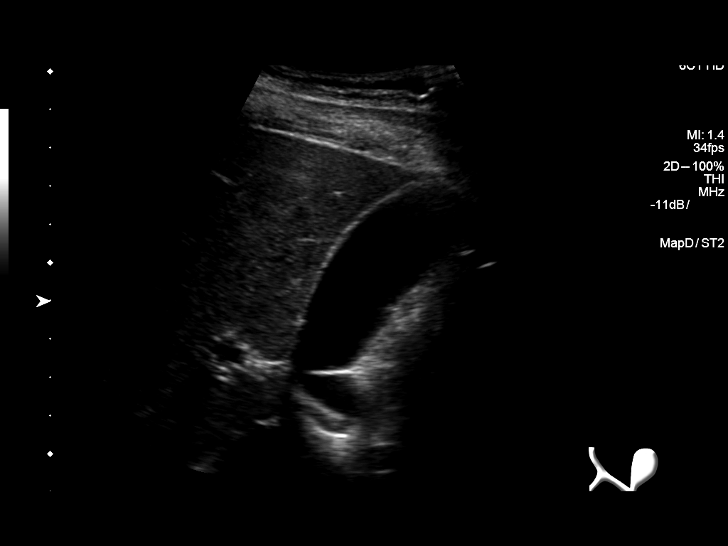
[im 5/50]
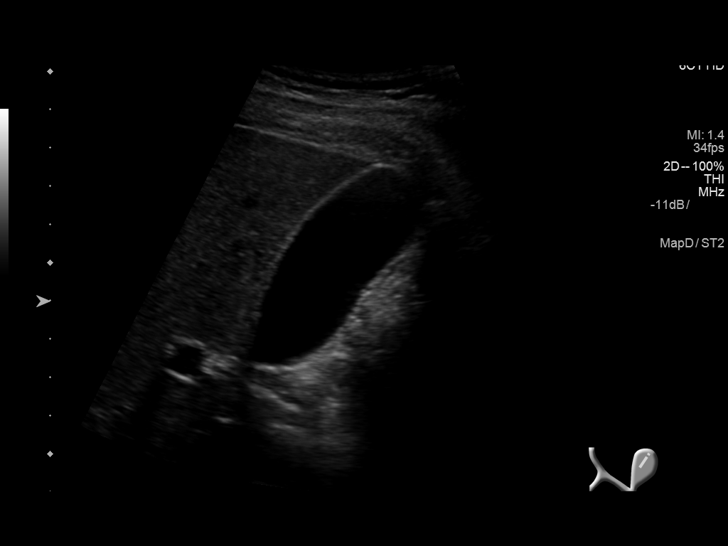
[im 9/50]
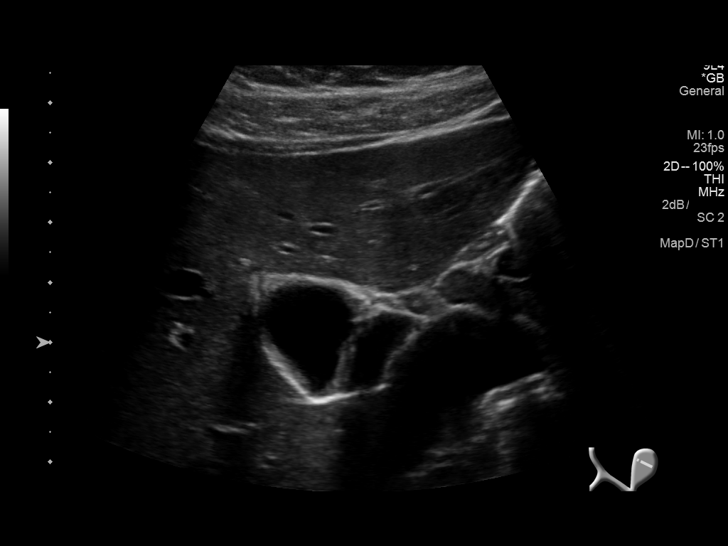
[im 13/50]
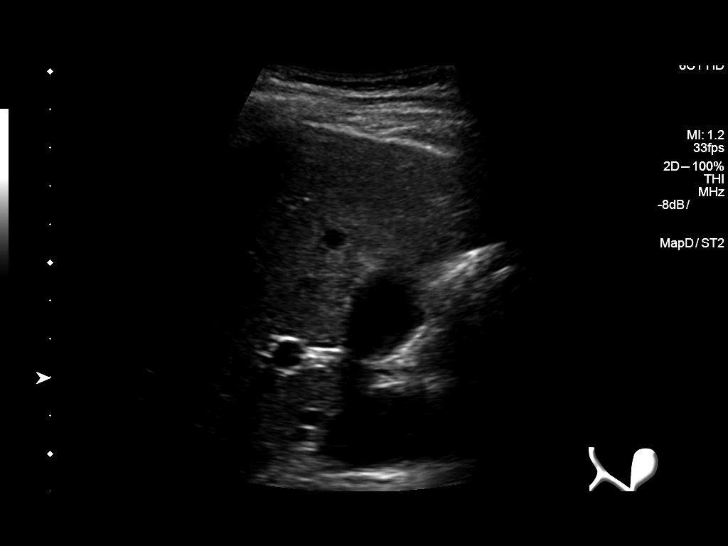
[im 17/50]
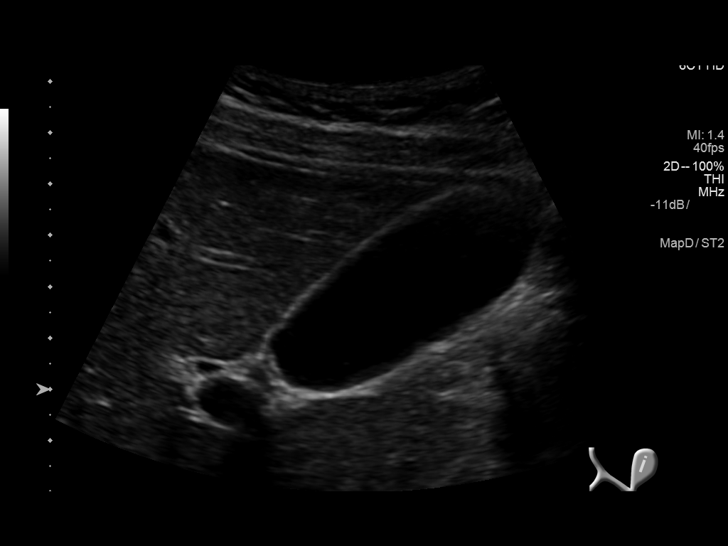
[im 19/50]
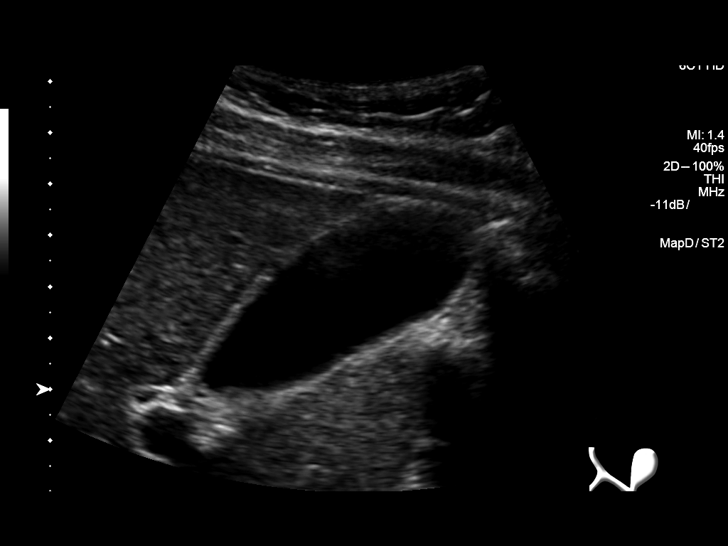
[im 23/50]
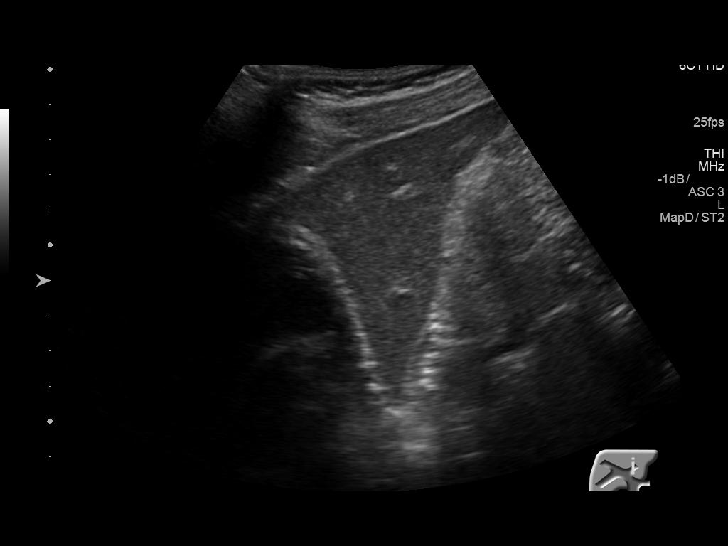
[im 27/50]
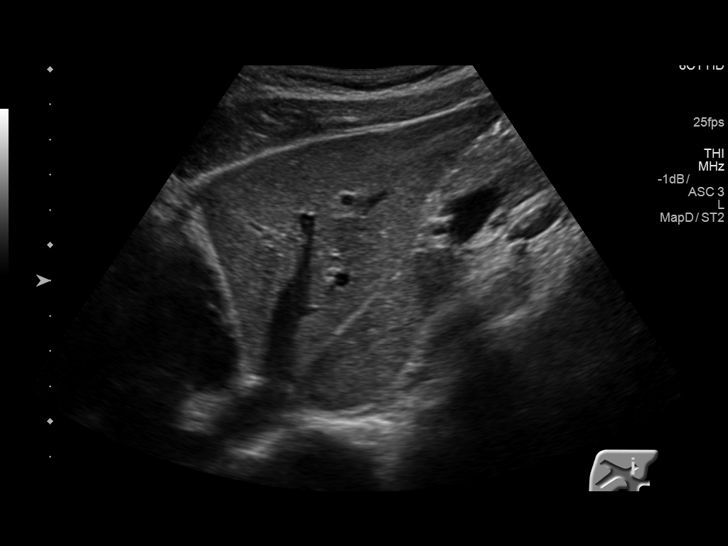
[im 31/50]
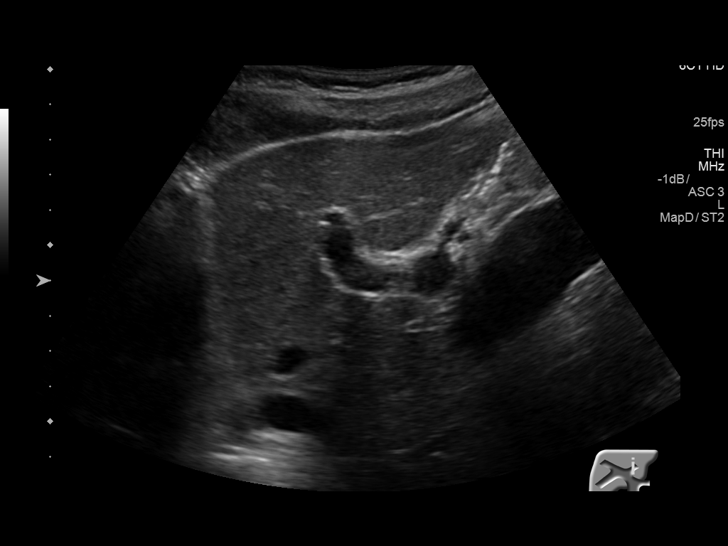
[im 33/50]
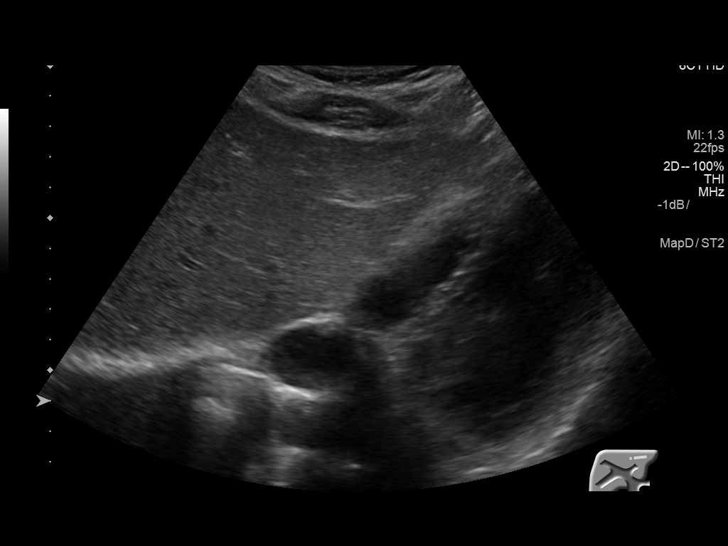
[im 37/50]
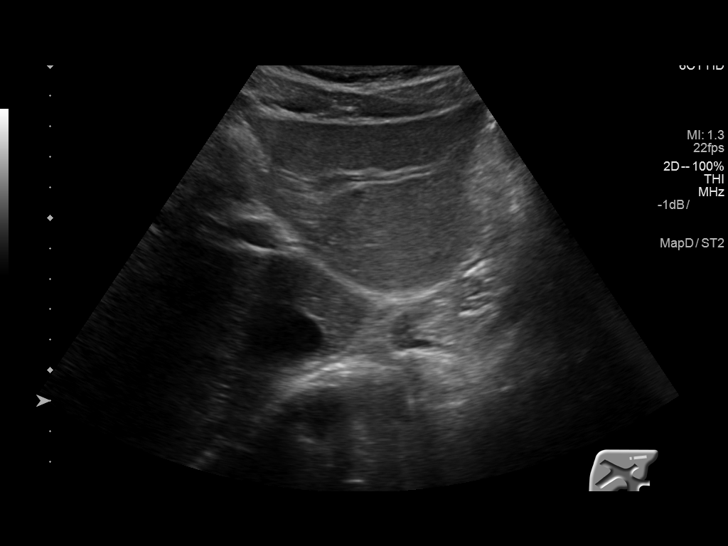
[im 41/50]
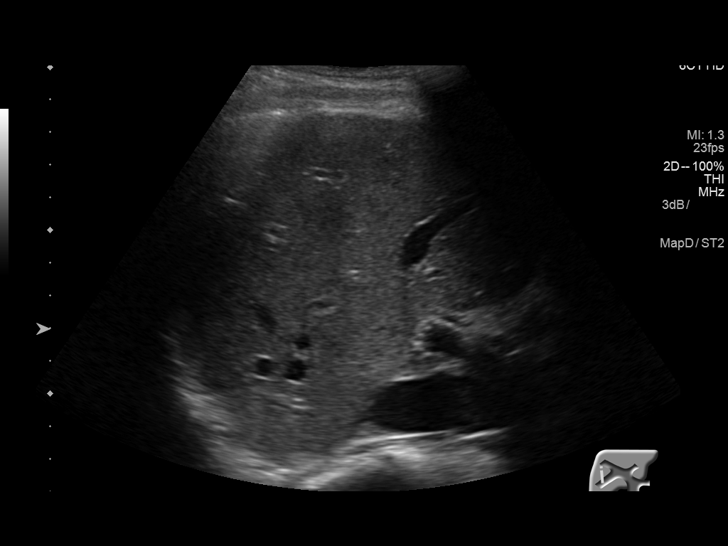
[im 45/50]
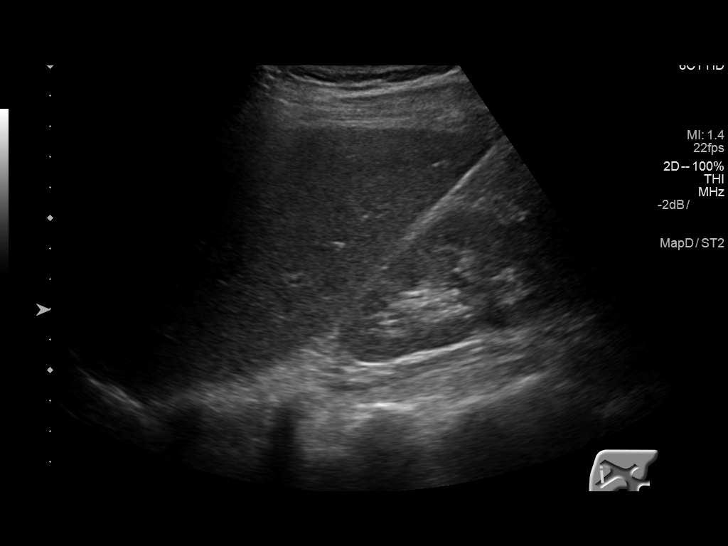
[im 50/50]
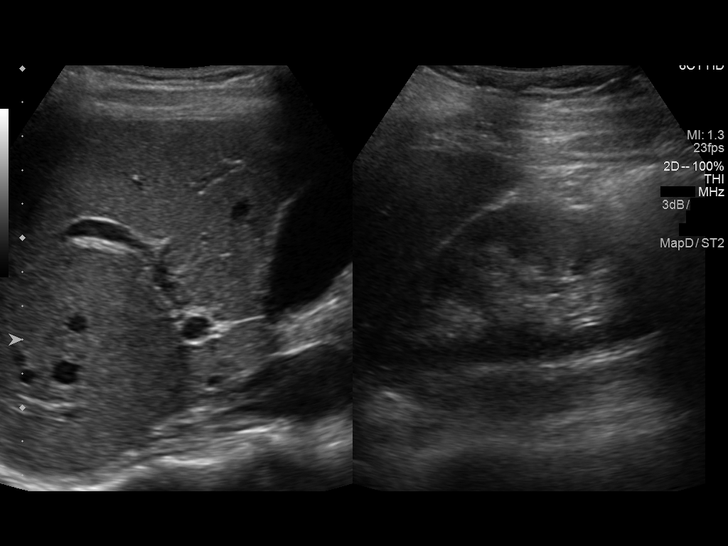

[14 of 25 positions shown; findings below may reference images not displayed]

FINDINGS: Gallbladder:

No gallstones or wall thickening visualized. No sonographic Murphy
sign noted by sonographer.

Common bile duct:

Diameter: Normal caliber, 2 mm.

Liver:

No focal lesion identified. Within normal limits in parenchymal
echogenicity.
IMPRESSION: Normal study.

## 2017-07-15 ENCOUNTER — Encounter: Payer: Self-pay | Admitting: Family Medicine

## 2017-07-16 ENCOUNTER — Ambulatory Visit: Payer: Commercial Managed Care - PPO | Admitting: Family Medicine

## 2017-07-22 ENCOUNTER — Ambulatory Visit: Payer: Commercial Managed Care - PPO | Admitting: Family Medicine

## 2017-07-22 ENCOUNTER — Encounter: Payer: Self-pay | Admitting: Family Medicine

## 2017-07-22 VITALS — BP 108/68 | HR 73 | Temp 98.5°F | Wt 147.1 lb

## 2017-07-22 DIAGNOSIS — F411 Generalized anxiety disorder: Secondary | ICD-10-CM

## 2017-07-22 DIAGNOSIS — J029 Acute pharyngitis, unspecified: Secondary | ICD-10-CM

## 2017-07-22 DIAGNOSIS — R0789 Other chest pain: Secondary | ICD-10-CM

## 2017-07-22 MED ORDER — VENLAFAXINE HCL ER 37.5 MG PO CP24: 37.5000 mg | ORAL_CAPSULE | Freq: Every day | ORAL | 3 refills | Status: DC

## 2017-07-22 MED ORDER — PREDNISONE 50 MG PO TABS: 50.0000 mg | ORAL_TABLET | Freq: Every day | ORAL | 0 refills | Status: DC

## 2017-07-22 NOTE — Patient Instructions (Addendum)
Venlafaxine may be compatible with breastfeeding. A review of eight studies of nursing infants (total n = 41) who were exposed to venlafaxine and the active metabolite desvenlafaxine through breastfeeding found that no acute adverse events occurred [34]. However, infant serum concentrations of venlafaxine and desvenlafaxine are often detectable [34].-- Like we talked about, it doesn't seem to do any harm to the babies, and as you're going to be weaning soon, the benefits seem to outweigh the risks. If you change your mind on taking this, please give [place] a call.  Venlafaxine extended-release capsules What is this medicine? VENLAFAXINE(VEN la fax een) is used to treat depression, anxiety and panic disorder. This medicine may be used for other purposes; ask your health care provider or pharmacist if you have questions. COMMON BRAND NAME(S): Effexor XR What should I tell my health care provider before I take this medicine? They need to know if you have any of these conditions: -bleeding disorders -glaucoma -heart disease -high blood pressure -high cholesterol -kidney disease -liver disease -low levels of sodium in the blood -mania or bipolar disorder -seizures -suicidal thoughts, plans, or attempt; a previous suicide attempt by you or a family -take medicines that treat or prevent blood clots -thyroid disease -an unusual or allergic reaction to venlafaxine, desvenlafaxine, other medicines, foods, dyes, or preservatives -pregnant or trying to get pregnant -breast-feeding How should I use this medicine? Take this medicine by mouth with a full glass of water. Follow the directions on the prescription label. Do not cut, crush, or chew this medicine. Take it with food. If needed, the capsule may be carefully opened and the entire contents sprinkled on a spoonful of cool applesauce. Swallow the applesauce/pellet mixture right away without chewing and follow with a glass of water to ensure complete  swallowing of the pellets. Try to take your medicine at about the same time each day. Do not take your medicine more often than directed. Do not stop taking this medicine suddenly except upon the advice of your doctor. Stopping this medicine too quickly may cause serious side effects or your condition may worsen. A special MedGuide will be given to you by the pharmacist with each prescription and refill. Be sure to read this information carefully each time. Talk to your pediatrician regarding the use of this medicine in children. Special care may be needed. Overdosage: If you think you have taken too much of this medicine contact a poison control center or emergency room at once. NOTE: This medicine is only for you. Do not share this medicine with others. What if I miss a dose? If you miss a dose, take it as soon as you can. If it is almost time for your next dose, take only that dose. Do not take double or extra doses. What may interact with this medicine? Do not take this medicine with any of the following medications: -certain medicines for fungal infections like fluconazole, itraconazole, ketoconazole, posaconazole, voriconazole -cisapride -desvenlafaxine -dofetilide -dronedarone -duloxetine -levomilnacipran -linezolid -MAOIs like Carbex, Eldepryl, Marplan, Nardil, and Parnate -methylene blue (injected into a vein) -milnacipran -pimozide -thioridazine -ziprasidone This medicine may also interact with the following medications: -amphetamines -aspirin and aspirin-like medicines -certain medicines for depression, anxiety, or psychotic disturbances -certain medicines for migraine headaches like almotriptan, eletriptan, frovatriptan, naratriptan, rizatriptan, sumatriptan, zolmitriptan -certain medicines for sleep -certain medicines that treat or prevent blood clots like dalteparin, enoxaparin,  warfarin -cimetidine -clozapine -diuretics -fentanyl -furazolidone -indinavir -isoniazid -lithium -metoprolol -NSAIDS, medicines for pain and inflammation, like ibuprofen or naproxen -  other medicines that prolong the QT interval (cause an abnormal heart rhythm) -procarbazine -rasagiline -supplements like St. John's wort, kava kava, valerian -tramadol -tryptophan This list may not describe all possible interactions. Give your health care provider a list of all the medicines, herbs, non-prescription drugs, or dietary supplements you use. Also tell them if you smoke, drink alcohol, or use illegal drugs. Some items may interact with your medicine. What should I watch for while using this medicine? Tell your doctor if your symptoms do not get better or if they get worse. Visit your doctor or health care professional for regular checks on your progress. Because it may take several weeks to see the full effects of this medicine, it is important to continue your treatment as prescribed by your doctor. Patients and their families should watch out for new or worsening thoughts of suicide or depression. Also watch out for sudden changes in feelings such as feeling anxious, agitated, panicky, irritable, hostile, aggressive, impulsive, severely restless, overly excited and hyperactive, or not being able to sleep. If this happens, especially at the beginning of treatment or after a change in dose, call your health care professional. This medicine can cause an increase in blood pressure. Check with your doctor for instructions on monitoring your blood pressure while taking this medicine. You may get drowsy or dizzy. Do not drive, use machinery, or do anything that needs mental alertness until you know how this medicine affects you. Do not stand or sit up quickly, especially if you are an older patient. This reduces the risk of dizzy or fainting spells. Alcohol may interfere with the effect of this medicine.  Avoid alcoholic drinks. Your mouth may get dry. Chewing sugarless gum, sucking hard candy and drinking plenty of water will help. Contact your doctor if the problem does not go away or is severe. What side effects may I notice from receiving this medicine? Side effects that you should report to your doctor or health care professional as soon as possible: -allergic reactions like skin rash, itching or hives, swelling of the face, lips, or tongue -anxious -breathing problems -confusion -changes in vision -chest pain -confusion -elevated mood, decreased need for sleep, racing thoughts, impulsive behavior -eye pain -fast, irregular heartbeat -feeling faint or lightheaded, falls -feeling agitated, angry, or irritable -hallucination, loss of contact with reality -high blood pressure -loss of balance or coordination -palpitations -redness, blistering, peeling or loosening of the skin, including inside the mouth -restlessness, pacing, inability to keep still -seizures -stiff muscles -suicidal thoughts or other mood changes -trouble passing urine or change in the amount of urine -trouble sleeping -unusual bleeding or bruising -unusually weak or tired -vomiting Side effects that usually do not require medical attention (report to your doctor or health care professional if they continue or are bothersome): -change in sex drive or performance -change in appetite or weight -constipation -dizziness -dry mouth -headache -increased sweating -nausea -tired This list may not describe all possible side effects. Call your doctor for medical advice about side effects. You may report side effects to FDA at 1-800-FDA-1088. Where should I keep my medicine? Keep out of the reach of children. Store at a controlled temperature between 20 and 25 degrees C (68 degrees and 77 degrees F), in a dry place. Throw away any unused medicine after the expiration date. NOTE: This sheet is a summary. It may not  cover all possible information. If you have questions about this medicine, talk to your doctor, pharmacist, or health care provider.  2018 Elsevier/Gold Standard (2015-11-17 18:38:02)

## 2017-07-22 NOTE — Progress Notes (Signed)
BP 108/68 (BP Location: Left Arm, Patient Position: Sitting, Cuff Size: Normal)   Pulse 73   Temp 98.5 F (36.9 C)   Wt 147 lb 1 oz (66.7 kg)   SpO2 100%   BMI 23.03 kg/m    Subjective:    Patient ID: Gabrielle Jackson, female    DOB: [birth year], 27 y.o.   MRN: [id number]  HPI: Gabrielle Jackson is a 27 y.o. female  Chief Complaint  Patient presents with  . Anxiety   ANXIETY/STRESS- was having issues with chest pain and heart skipping beats about 2 weeks ago. Was also having numbness and tingling in her arms and fingers. Has been off of her anxiety medicine and her kids have been sick. Not feeling like herself. She is still breast feeding, so has not gone back on her effexor.  Duration:exacerbated Anxious mood: yes  Excessive worrying: yes Irritability: yes  Sweating: no Nausea: no Palpitations:yes Hyperventilation: no Panic attacks: yes Agoraphobia: no  Obscessions/compulsions: yes Depressed mood: yes Depression screen [facility] 2/9 07/22/2017 03/01/2017 02/04/2017 10/04/2015 09/05/2015  Decreased Interest 1 0 2 1 1   Down, Depressed, Hopeless 1 0 2 2 1   PHQ - 2 Score 2 0 4 3 2   Altered sleeping 3 1 3 2  -  Tired, decreased energy 3 1 3 3  -  Change in appetite 3 1 2 3  -  Feeling bad or failure about yourself  1 0 2 0 -  Trouble concentrating 1 0 1 1 -  Moving slowly or fidgety/restless 0 0 1 0 -  Suicidal thoughts 0 0 0 0 -  PHQ-9 Score 13 3 16 12  -  Difficult doing work/chores - - Somewhat difficult Somewhat difficult -   GAD 7 : Generalized Anxiety Score 07/22/2017 03/01/2017 02/04/2017 10/04/2015  Nervous, Anxious, on Edge 3 1 3 3   Control/stop worrying 3 1 3 3   Worry too much - different things 3 1 3 3   Trouble relaxing 3 1 3 2   Restless 3 2 2 1   Easily annoyed or irritable 3 0 3 2  Afraid - awful might happen 3 1 3 2   Total GAD 7 Score 21 7 20 16   Anxiety Difficulty - Somewhat difficult Very difficult Very difficult   Anhedonia: no Weight changes: no Insomnia: yes  hard to fall asleep  Hypersomnia: no Fatigue/loss of energy: yes Feelings of worthlessness: no Feelings of guilt: yes Impaired concentration/indecisiveness: yes Suicidal ideations: no  Crying spells: yes Recent Stressors/Life Changes: no   Relationship problems: no   Family stress: no     Financial stress: no    Job stress: no    Recent death/loss: no  Has had a sore throat and R ear pain for several months. Doesn't seem to be getting better. No fevers. No chills. No other concerns.   Relevant past medical, surgical, family and social history reviewed and updated as indicated. Interim medical history since our last visit reviewed. Allergies and medications reviewed and updated.  Review of Systems  Constitutional: Negative.   HENT: Positive for ear pain, postnasal drip and sore throat. Negative for congestion, dental problem, drooling, ear discharge, facial swelling, hearing loss, mouth sores, nosebleeds, rhinorrhea, sinus pressure, sinus pain, sneezing, tinnitus, trouble swallowing and voice change.   Respiratory: Negative.   Cardiovascular: Negative.   Psychiatric/Behavioral: Positive for dysphoric mood. Negative for agitation, behavioral problems, confusion, decreased concentration, hallucinations, self-injury, sleep disturbance and suicidal ideas. The patient is nervous/anxious. The patient is not hyperactive.  Per HPI unless specifically indicated above     Objective:    BP 108/68 (BP Location: Left Arm, Patient Position: Sitting, Cuff Size: Normal)   Pulse 73   Temp 98.5 F (36.9 C)   Wt 147 lb 1 oz (66.7 kg)   SpO2 100%   BMI 23.03 kg/m   Wt Readings from Last 3 Encounters:  07/22/17 147 lb 1 oz (66.7 kg)  06/28/17 149 lb (67.6 kg)  06/14/17 147 lb 12.8 oz (67 kg)    Physical Exam  Constitutional: She is oriented to person, place, and time. She appears well-developed and well-nourished. No distress.  HENT:  Head: Normocephalic and atraumatic.  Right Ear:  Hearing, tympanic membrane, external ear and ear canal normal.  Left Ear: Hearing, tympanic membrane, external ear and ear canal normal.  Nose: Nose normal.  Mouth/Throat: Uvula is midline and mucous membranes are normal. She does not have dentures. No oral lesions. No trismus in the jaw. Normal dentition. No dental abscesses, uvula swelling, lacerations or dental caries. Posterior oropharyngeal edema present. No oropharyngeal exudate, posterior oropharyngeal erythema or tonsillar abscesses.  Eyes: Conjunctivae, EOM and lids are normal. Pupils are equal, round, and reactive to light. Right eye exhibits no discharge. Left eye exhibits no discharge. No scleral icterus.  Neck: Normal range of motion. Neck supple. No JVD present. No tracheal deviation present. No thyromegaly present.  Cardiovascular: Normal rate, regular rhythm, normal heart sounds and intact distal pulses. Exam reveals no gallop and no friction rub.  No murmur heard. Pulmonary/Chest: Effort normal and breath sounds normal. No stridor. No respiratory distress. She has no wheezes. She has no rales. She exhibits no tenderness.  Musculoskeletal: Normal range of motion.  Lymphadenopathy:    She has no cervical adenopathy.  Neurological: She is alert and oriented to person, place, and time.  Skin: Skin is warm, dry and intact. No rash noted. She is not diaphoretic. No erythema. No pallor.  Psychiatric: She has a normal mood and affect. Her speech is normal and behavior is normal. Judgment and thought content normal. Cognition and memory are normal.  Nursing note and vitals reviewed.   Results for orders placed or performed in visit on 06/28/17  Rapid Strep Screen (Not at [facility])  Result Value Ref Range   Strep Gp A Ag, IA W/Reflex Negative Negative  Culture, Group A Strep  Result Value Ref Range   Strep A Culture CANCELED   Veritor Flu A/B Waived  Result Value Ref Range   Influenza A Negative Negative   Influenza B Negative Negative       Assessment & Plan:   Problem List Items Addressed This Visit      Other   Anxiety disorder - Primary    Has been off her meds for about 2 years due to pregnancy and breast feeding. Daughter is now 9 months old and she is starting to wean. Discussed risks and benefits of breast feeding and restarting effexor. Discussed that studies have shown effexor in breast milk, but that it has not had any known negative effects on the babies. As she is about to wean, benefits outweigh the risks. Will restart her effexor as she has had bad reactions to other medicines in the past. Call with any concerns. Recheck 1 month.       Relevant Medications   venlafaxine XR (EFFEXOR XR) 37.5 MG 24 hr capsule    Other Visit Diagnoses    Other chest pain  Normal EKG today. Likely due to anxiety. Will treat anxiety. Call with any concerns or if not getting better.    Relevant Orders   EKG 12-Lead (Completed)   Sore throat       Negative strep, + tonsilar hypertrophy with stone. Will treat with prednisone to help with swelling. Gargle with salt water. Call with any concerns.    Relevant Orders   Rapid Strep Screen (Not at [facility])       Follow up plan: Return in about 4 weeks (around 08/19/2017) for follow up mood.

## 2017-07-22 NOTE — Assessment & Plan Note (Addendum)
Has been off her meds for about 2 years due to pregnancy and breast feeding. Daughter is now 29 months old and she is starting to wean. Discussed risks and benefits of breast feeding and restarting effexor. Discussed that studies have shown effexor in breast milk, but that it has not had any known negative effects on the babies. As she is about to wean, benefits outweigh the risks. Will restart her effexor as she has had bad reactions to other medicines in the past. Call with any concerns. Recheck 1 month.

## 2017-07-24 ENCOUNTER — Encounter: Payer: Self-pay | Admitting: Family Medicine

## 2017-07-25 LAB — CULTURE, GROUP A STREP: Strep A Culture: NEGATIVE

## 2017-07-25 LAB — RAPID STREP SCREEN (MED CTR MEBANE ONLY): Strep Gp A Ag, IA W/Reflex: NEGATIVE

## 2017-07-26 ENCOUNTER — Ambulatory Visit: Payer: Commercial Managed Care - PPO | Admitting: Family Medicine

## 2017-08-13 ENCOUNTER — Encounter: Payer: Self-pay | Admitting: Family Medicine

## 2017-08-13 ENCOUNTER — Other Ambulatory Visit: Payer: Self-pay | Admitting: Family Medicine

## 2017-08-13 MED ORDER — PREDNISONE 50 MG PO TABS: 50.0000 mg | ORAL_TABLET | Freq: Every day | ORAL | 0 refills | Status: DC

## 2017-08-21 ENCOUNTER — Encounter: Payer: Self-pay | Admitting: Family Medicine

## 2017-08-22 ENCOUNTER — Encounter: Payer: Self-pay | Admitting: Family Medicine

## 2017-08-23 ENCOUNTER — Ambulatory Visit (INDEPENDENT_AMBULATORY_CARE_PROVIDER_SITE_OTHER): Payer: Commercial Managed Care - PPO | Admitting: Family Medicine

## 2017-08-23 ENCOUNTER — Encounter: Payer: Self-pay | Admitting: Family Medicine

## 2017-08-23 VITALS — Temp 98.1°F | Wt 147.1 lb

## 2017-08-23 DIAGNOSIS — H6501 Acute serous otitis media, right ear: Secondary | ICD-10-CM | POA: Diagnosis not present

## 2017-08-23 MED ORDER — AZITHROMYCIN 250 MG PO TABS: ORAL_TABLET | ORAL | 0 refills | Status: DC

## 2017-08-23 NOTE — Progress Notes (Signed)
Temp 98.1 F (36.7 C)   Wt 147 lb 1 oz (66.7 kg)   SpO2 100%   BMI 23.03 kg/m    Subjective:    Patient ID: Gabrielle Jackson, female    DOB: 10/26/1990, 27 y.o.   MRN: [id number]  HPI: Gabrielle Jackson is a 27 y.o. female  Chief Complaint  Patient presents with  . Ear Pain   EAR PAIN Duration: 2 weeks Involved ear(s): right Severity:  moderate  Quality:  Aching and occasionally sharp Fever: no Otorrhea: no Upper respiratory infection symptoms: yes Pruritus: no Hearing loss: no Water immersion no Using Q-tips: no Recurrent otitis media: no Status: stable Treatments attempted: prednisone  Relevant past medical, surgical, family and social history reviewed and updated as indicated. Interim medical history since our last visit reviewed. Allergies and medications reviewed and updated.  Review of Systems  Constitutional: Negative.   HENT: Positive for ear pain. Negative for congestion, dental problem, drooling, ear discharge, facial swelling, hearing loss, mouth sores, nosebleeds, postnasal drip, rhinorrhea, sinus pressure, sinus pain, sneezing, sore throat, tinnitus, trouble swallowing and voice change.   Respiratory: Negative.   Cardiovascular: Negative.   Psychiatric/Behavioral: Negative.     Per HPI unless specifically indicated above     Objective:    Temp 98.1 F (36.7 C)   Wt 147 lb 1 oz (66.7 kg)   SpO2 100%   BMI 23.03 kg/m   Wt Readings from Last 3 Encounters:  08/23/17 147 lb 1 oz (66.7 kg)  07/22/17 147 lb 1 oz (66.7 kg)  06/28/17 149 lb (67.6 kg)    Physical Exam  Constitutional: She is oriented to person, place, and time. She appears well-developed and well-nourished. No distress.  HENT:  Head: Normocephalic and atraumatic.  Right Ear: Hearing, external ear and ear canal normal. Tympanic membrane is injected and bulging. A middle ear effusion is present.  Left Ear: Hearing, tympanic membrane, external ear and ear canal normal.  Nose:  Nose normal.  Mouth/Throat: Oropharynx is clear and moist. No oropharyngeal exudate.  Eyes: Conjunctivae, EOM and lids are normal. Pupils are equal, round, and reactive to light. Right eye exhibits no discharge. Left eye exhibits no discharge. No scleral icterus.  Neck: Normal range of motion. Neck supple. No JVD present. No tracheal deviation present. No thyromegaly present.  Cardiovascular: Normal rate, regular rhythm, normal heart sounds and intact distal pulses. Exam reveals no gallop and no friction rub.  No murmur heard. Pulmonary/Chest: Effort normal and breath sounds normal. No stridor. No respiratory distress. She has no wheezes. She has no rales. She exhibits no tenderness.  Musculoskeletal: Normal range of motion.  Lymphadenopathy:    She has no cervical adenopathy.  Neurological: She is alert and oriented to person, place, and time.  Skin: Skin is warm, dry and intact. No rash noted. She is not diaphoretic. No erythema. No pallor.  Psychiatric: She has a normal mood and affect. Her speech is normal and behavior is normal. Judgment and thought content normal. Cognition and memory are normal.  Nursing note and vitals reviewed.   Results for orders placed or performed in visit on 07/22/17  Rapid Strep Screen (Not at [facility])  Result Value Ref Range   Strep Gp A Ag, IA W/Reflex Negative Negative  Culture, Group A Strep  Result Value Ref Range   Strep A Culture Negative       Assessment & Plan:   Problem List Items Addressed This Visit    None  Visit Diagnoses    Right acute serous otitis media, recurrence not specified    -  Primary   Will treat with azithromycin due to breast feeding and PCN allergy. Call if not feeling better or feeling worse.   Relevant Medications   azithromycin (ZITHROMAX) 250 MG tablet       Follow up plan: Return if symptoms worsen or fail to improve.

## 2017-08-26 ENCOUNTER — Encounter: Payer: Self-pay | Admitting: Family Medicine

## 2017-08-26 MED ORDER — CEFDINIR 300 MG PO CAPS: 300.0000 mg | ORAL_CAPSULE | Freq: Two times a day (BID) | ORAL | 0 refills | Status: DC

## 2017-09-11 ENCOUNTER — Encounter: Payer: Self-pay | Admitting: Family Medicine

## 2017-10-07 ENCOUNTER — Encounter: Payer: Self-pay | Admitting: Family Medicine

## 2017-10-11 ENCOUNTER — Ambulatory Visit: Payer: Commercial Managed Care - PPO | Admitting: Family Medicine

## 2017-10-14 ENCOUNTER — Ambulatory Visit: Payer: Commercial Managed Care - PPO | Admitting: Family Medicine

## 2017-10-17 ENCOUNTER — Ambulatory Visit: Payer: Commercial Managed Care - PPO | Admitting: Family Medicine

## 2017-10-17 ENCOUNTER — Encounter: Payer: Self-pay | Admitting: Family Medicine

## 2017-10-17 VITALS — BP 117/73 | HR 76 | Ht 65.0 in | Wt 147.0 lb

## 2017-10-17 DIAGNOSIS — R0781 Pleurodynia: Secondary | ICD-10-CM

## 2017-10-17 DIAGNOSIS — R1031 Right lower quadrant pain: Secondary | ICD-10-CM | POA: Diagnosis not present

## 2017-10-17 LAB — UA/M W/RFLX CULTURE, ROUTINE
BILIRUBIN UA: NEGATIVE
GLUCOSE, UA: NEGATIVE
Ketones, UA: NEGATIVE
Nitrite, UA: NEGATIVE
Protein, UA: NEGATIVE
RBC, UA: NEGATIVE
Specific Gravity, UA: 1.02 (ref 1.005–1.030)
UUROB: 0.2 mg/dL (ref 0.2–1.0)
pH, UA: 7 (ref 5.0–7.5)

## 2017-10-17 LAB — MICROSCOPIC EXAMINATION: BACTERIA UA: NONE SEEN

## 2017-10-17 NOTE — Progress Notes (Signed)
BP 117/73   Pulse 76   Ht 5\' 5"  (1.651 m)   Wt 147 lb (66.7 kg)   SpO2 98%   BMI 24.46 kg/m    Subjective:    Patient ID: Gabrielle Jackson, female    DOB: 06-12-1991, 27 y.o.   MRN: [id number]  HPI: Gabrielle Jackson is a 27 y.o. female  Chief Complaint  Patient presents with  . Abdominal Pain    x 6 months, comes and goes, worsening. Denies injury  . Ovarian Cyst   Pt here for chronic intermittent right rib pain that has been going on for about 2 years now. No known injury, and multiple RUQ u/s's without abnormality. Denies assoc N/V/D, fevers, skin changes. Pain worse with movement and direct pressure, does not seem to be affected by eating whatsoever. Was given flexeril for this several months ago but never started it as she's still breastfeeding. Taking tylenol prn with minimal relief.   Hx of right ovarian cyst, used to only be painful during periods but the past week has been painful constantly. Sharp, localized pain. Denies abnormal bleeding, N/V, fevers. LMP was last week, states no chance of pregnancy.   Past Medical History:  Diagnosis Date  . Breast mass 05/02/2010   lt breast pain  . Dysmenorrhea   . Velamentous insertion of umbilical cord    Social History   Socioeconomic History  . Marital status: Married    Spouse name: Not on file  . Number of children: Not on file  . Years of education: Not on file  . Highest education level: Not on file  Occupational History  . Not on file  Social Needs  . Financial resource strain: Not on file  . Food insecurity:    Worry: Not on file    Inability: Not on file  . Transportation needs:    Medical: Not on file    Non-medical: Not on file  Tobacco Use  . Smoking status: Never Smoker  . Smokeless tobacco: Never Used  Substance and Sexual Activity  . Alcohol use: Yes    Alcohol/week: 0.0 oz    Comment: On occasion  . Drug use: No  . Sexual activity: Yes    Birth control/protection: None  Lifestyle  .  Physical activity:    Days per week: Not on file    Minutes per session: Not on file  . Stress: Not on file  Relationships  . Social connections:    Talks on phone: Not on file    Gets together: Not on file    Attends religious service: Not on file    Active member of club or organization: Not on file    Attends meetings of clubs or organizations: Not on file    Relationship status: Not on file  . Intimate partner violence:    Fear of current or ex partner: Not on file    Emotionally abused: Not on file    Physically abused: Not on file    Forced sexual activity: Not on file  Other Topics Concern  . Not on file  Social History Narrative  . Not on file    Relevant past medical, surgical, family and social history reviewed and updated as indicated. Interim medical history since our last visit reviewed. Allergies and medications reviewed and updated.  Review of Systems  Per HPI unless specifically indicated above     Objective:    BP 117/73   Pulse 76   Ht 5'  5" (1.651 m)   Wt 147 lb (66.7 kg)   SpO2 98%   BMI 24.46 kg/m   Wt Readings from Last 3 Encounters:  10/17/17 147 lb (66.7 kg)  08/23/17 147 lb 1 oz (66.7 kg)  07/22/17 147 lb 1 oz (66.7 kg)    Physical Exam  Constitutional: She is oriented to person, place, and time. She appears well-developed and well-nourished. She does not appear ill. No distress.  HENT:  Head: Atraumatic.  Eyes: Pupils are equal, round, and reactive to light. EOM are normal.  Neck: Normal range of motion. Neck supple.  Cardiovascular: Normal rate and regular rhythm.  Pulmonary/Chest: Effort normal and breath sounds normal.  Abdominal: Soft. Bowel sounds are normal. She exhibits no distension. There is tenderness (TTP over right anterior ribs, minimal right pelvic ttp).  Musculoskeletal: Normal range of motion.  Neurological: She is alert and oriented to person, place, and time.  Skin: Skin is warm and dry.  Psychiatric: She has a  normal mood and affect. Her behavior is normal.  Nursing note and vitals reviewed.   Results for orders placed or performed in visit on 10/17/17  Microscopic Examination  Result Value Ref Range   WBC, UA 0-5 0 - 5 /hpf   RBC, UA 0-2 0 - 2 /hpf   Epithelial Cells (non renal) 0-10 0 - 10 /hpf   Bacteria, UA None seen None seen/Few  UA/M w/rflx Culture, Routine  Result Value Ref Range   Specific Gravity, UA 1.020 1.005 - 1.030   pH, UA 7.0 5.0 - 7.5   Color, UA Yellow Yellow   Appearance Ur Hazy (A) Clear   Leukocytes, UA Trace (A) Negative   Protein, UA Negative Negative/Trace   Glucose, UA Negative Negative   Ketones, UA Negative Negative   RBC, UA Negative Negative   Bilirubin, UA Negative Negative   Urobilinogen, Ur 0.2 0.2 - 1.0 mg/dL   Nitrite, UA Negative Negative   Microscopic Examination See below:       Assessment & Plan:   Problem List Items Addressed This Visit    None    Visit Diagnoses    Rib pain on right side    -  Primary   Still suspecting muscular pain, multiple RUQ u/s's and CT abdomen pelvis without RUQ abnormality. WIll get rib film, lidocaine patches, stretches and exercises   Relevant Orders   DG Ribs Unilateral Right   Abdominal pain, RLQ       Will r/o UTI, pt declines pelvic u/s today for further eval. Wanting to continue to monitor a bit longer. Return precautions reviewed, including torsion sxs.    Relevant Orders   UA/M w/rflx Culture, Routine (Completed)       Follow up plan: Return if symptoms worsen or fail to improve.

## 2017-10-18 ENCOUNTER — Telehealth: Payer: Self-pay | Admitting: Family Medicine

## 2017-10-18 NOTE — Telephone Encounter (Signed)
I only see urine results, which I sent her the results of. Please find out if she had other questions?

## 2017-10-18 NOTE — Telephone Encounter (Signed)
Do you know what labs she is talking about.

## 2017-10-18 NOTE — Telephone Encounter (Signed)
Patient notified that urine results were normal vis voicemail.

## 2017-10-18 NOTE — Telephone Encounter (Signed)
Copied from [place] [phone]. Topic: Quick Communication - See Telephone Encounter >> Oct 18, 2017 10:31 AM Clack, Laban Emperor wrote: CRM for notification. See Telephone encounter for: 10/18/17.  Pt calling for lab results.  Please f/u with pt.

## 2017-10-20 NOTE — Patient Instructions (Signed)
Follow up as needed

## 2017-10-22 ENCOUNTER — Other Ambulatory Visit: Payer: Self-pay | Admitting: Family Medicine

## 2017-10-22 DIAGNOSIS — Z8742 Personal history of other diseases of the female genital tract: Secondary | ICD-10-CM

## 2017-10-22 DIAGNOSIS — R102 Pelvic and perineal pain: Secondary | ICD-10-CM

## 2017-10-23 ENCOUNTER — Other Ambulatory Visit: Payer: Commercial Managed Care - PPO

## 2017-10-23 ENCOUNTER — Encounter: Payer: Self-pay | Admitting: Obstetrics and Gynecology

## 2017-10-23 ENCOUNTER — Ambulatory Visit (INDEPENDENT_AMBULATORY_CARE_PROVIDER_SITE_OTHER): Payer: Commercial Managed Care - PPO | Admitting: Obstetrics and Gynecology

## 2017-10-23 VITALS — BP 110/80 | Ht 67.0 in | Wt 150.0 lb

## 2017-10-23 DIAGNOSIS — Z01419 Encounter for gynecological examination (general) (routine) without abnormal findings: Secondary | ICD-10-CM

## 2017-10-23 DIAGNOSIS — Z124 Encounter for screening for malignant neoplasm of cervix: Secondary | ICD-10-CM

## 2017-10-23 DIAGNOSIS — R1031 Right lower quadrant pain: Secondary | ICD-10-CM | POA: Diagnosis not present

## 2017-10-23 NOTE — Patient Instructions (Signed)
I value your feedback and entrusting us with your care. If you get a [facility] patient survey, I would appreciate you taking the time to let us know about your experience today. Thank you! 

## 2017-10-23 NOTE — Progress Notes (Signed)
PCP:  Valerie Roys, DO   Chief Complaint  Patient presents with  . Gynecologic Exam     HPI:      [name] is a 27 y.o. [id number] who LMP was Patient's last menstrual period was 10/06/2017., presents today for her annual examination.  Her menses are regular every 28-30 days, lasting 5-7 days.  Dysmenorrhea mild, occurring first 1-2 days of flow. She does not have intermenstrual bleeding. Has noticed sharp, intermittent RLQ pains since her last menses. Has taken tylenol without relief. Hx of ovar cysts in past.   Sex activity: single partner, contraception - rhythm method. Pt declines BC. Last Pap: July 07, 2015  Results were: no abnormalities  Hx of STDs: none  There is no FH of breast cancer. There is no FH of ovarian cancer. The patient does do self-breast exams.  Tobacco use: The patient denies current or previous tobacco use. Alcohol use: none No drug use.  Exercise: not active  She does get adequate calcium and Vitamin D in her diet.   Past Medical History:  Diagnosis Date  . Breast mass 05/02/2010   lt breast pain  . Dysmenorrhea   . Ovarian cyst   . Velamentous insertion of umbilical cord     History reviewed. No pertinent surgical history.  Family History  Problem Relation Age of Onset  . Hypertension Father   . COPD Maternal Grandfather   . COPD Paternal Grandmother     Social History   Socioeconomic History  . Marital status: Married    Spouse name: Not on file  . Number of children: Not on file  . Years of education: Not on file  . Highest education level: Not on file  Occupational History  . Not on file  Social Needs  . Financial resource strain: Not on file  . Food insecurity:    Worry: Not on file    Inability: Not on file  . Transportation needs:    Medical: Not on file    Non-medical: Not on file  Tobacco Use  . Smoking status: Never Smoker  . Smokeless tobacco: Never Used  Substance and Sexual Activity  .  Alcohol use: Yes    Alcohol/week: 0.0 oz    Comment: On occasion  . Drug use: No  . Sexual activity: Yes    Birth control/protection: None  Lifestyle  . Physical activity:    Days per week: Not on file    Minutes per session: Not on file  . Stress: Not on file  Relationships  . Social connections:    Talks on phone: Not on file    Gets together: Not on file    Attends religious service: Not on file    Active member of club or organization: Not on file    Attends meetings of clubs or organizations: Not on file    Relationship status: Not on file  . Intimate partner violence:    Fear of current or ex partner: Not on file    Emotionally abused: Not on file    Physically abused: Not on file    Forced sexual activity: Not on file  Other Topics Concern  . Not on file  Social History Narrative  . Not on file    Outpatient Medications Prior to Visit  Medication Sig Dispense Refill  . venlafaxine XR (EFFEXOR XR) 37.5 MG 24 hr capsule Take 1 capsule (37.5 mg total) by mouth daily with breakfast. 30 capsule 3  .  azithromycin (ZITHROMAX) 250 MG tablet 2 tabs today, then 1 tab daily for 4 days (Patient not taking: Reported on 10/17/2017) 6 tablet 0  . cefdinir (OMNICEF) 300 MG capsule Take 1 capsule (300 mg total) by mouth 2 (two) times daily. (Patient not taking: Reported on 10/17/2017) 10 capsule 0   No facility-administered medications prior to visit.     ROS:  Review of Systems  Constitutional: Negative for fatigue, fever and unexpected weight change.  Respiratory: Negative for cough, shortness of breath and wheezing.   Cardiovascular: Negative for chest pain, palpitations and leg swelling.  Gastrointestinal: Negative for blood in stool, constipation, diarrhea, nausea and vomiting.  Endocrine: Negative for cold intolerance, heat intolerance and polyuria.  Genitourinary: Positive for pelvic pain. Negative for dyspareunia, dysuria, flank pain, frequency, genital sores, hematuria,  menstrual problem, urgency, vaginal bleeding, vaginal discharge and vaginal pain.  Musculoskeletal: Negative for back pain, joint swelling and myalgias.  Skin: Negative for rash.  Neurological: Negative for dizziness, syncope, light-headedness, numbness and headaches.  Hematological: Negative for adenopathy.  Psychiatric/Behavioral: Negative for agitation, confusion, sleep disturbance and suicidal ideas. The patient is not nervous/anxious.    BREAST: No symptoms   Objective: BP 110/80   Ht 5\' 7"  (1.702 m)   Wt 150 lb (68 kg)   LMP 10/06/2017   BMI 23.49 kg/m    Physical Exam  Constitutional: She is oriented to person, place, and time. She appears well-developed and well-nourished.  Genitourinary: Vagina normal and uterus normal. There is no rash or tenderness on the right labia. There is no rash or tenderness on the left labia. No erythema or tenderness in the vagina. No vaginal discharge found.  Right adnexum displays tenderness. Right adnexum does not display mass. Left adnexum does not display mass and does not display tenderness. Cervix does not exhibit motion tenderness or polyp. Uterus is not enlarged or tender.  Neck: Normal range of motion. No thyromegaly present.  Cardiovascular: Normal rate, regular rhythm and normal heart sounds.  No murmur heard. Pulmonary/Chest: Effort normal and breath sounds normal. Right breast exhibits no mass, no nipple discharge, no skin change and no tenderness. Left breast exhibits no mass, no nipple discharge, no skin change and no tenderness.  Abdominal: Soft. There is no tenderness. There is no rigidity and no guarding.  Musculoskeletal: Normal range of motion.  Neurological: She is alert and oriented to person, place, and time. No cranial nerve deficit.  Psychiatric: She has a normal mood and affect. Her behavior is normal.  Vitals reviewed.  Assessment/Plan: Encounter for annual routine gynecological examination  Cervical cancer screening  - Plan: IGP, rfx Aptima HPV ASCU  RLQ abdominal pain - Tender on exam. Hx of ovar cysts. Check GYN u/s. Will call with results.  - Plan: US PELVIS TRANSVANGINAL NON-OB (TV ONLY)        GYN counsel adequate intake of calcium and vitamin D, diet and exercise     F/U  Return for GYN u/s at 4:30 today for RLQ pain, ABC to call pt./ 1 yr annual  Alicia B. Copland, PA-C 10/23/2017 2:20 PM

## 2017-10-24 LAB — IGP, RFX APTIMA HPV ASCU: PAP Smear Comment: 0

## 2017-10-31 ENCOUNTER — Other Ambulatory Visit: Payer: Commercial Managed Care - PPO

## 2017-11-06 ENCOUNTER — Other Ambulatory Visit: Payer: Commercial Managed Care - PPO

## 2017-11-11 ENCOUNTER — Encounter: Payer: Self-pay | Admitting: Obstetrics and Gynecology

## 2017-11-13 ENCOUNTER — Ambulatory Visit (INDEPENDENT_AMBULATORY_CARE_PROVIDER_SITE_OTHER): Payer: Commercial Managed Care - PPO

## 2017-11-13 ENCOUNTER — Telehealth: Payer: Self-pay | Admitting: Obstetrics and Gynecology

## 2017-11-13 DIAGNOSIS — R1031 Right lower quadrant pain: Secondary | ICD-10-CM | POA: Diagnosis not present

## 2017-11-13 NOTE — Telephone Encounter (Signed)
Pt aware of neg GYN u/s results. RLQ pain persists, has decreased. Was really bad with LMP. No GI, urin sx. Question MSK. Follow sx and f/u prn. Try core stretching/hip stretching exercises.  Question etiology.   ULTRASOUND REPORT  Patient Name: Gabrielle Jackson DOB: 08-23-1990 MRN: [id number]  Location: [facility]  Date of Service: 11/13/2017    Indications:Pelvic Pain Findings:  The uterus is anteverted and measures 6.63 x 5.16 x 3.54cm. Echo texture is homogenous without evidence of focal masses.  The Endometrium measures 4.35 mm.  Right Ovary measures 3.04 x 2.28 x 2.40 cm. It is normal in appearance. Left Ovary measures 2.84 x 2.85 x 1.65 cm. It is normal in appearance. Survey of the adnexa demonstrates no adnexal masses. There is no free fluid in the cul de sac.  Impression: 1. Normal gyn ultrasound  Recommendations: 1.Clinical correlation with the patient's History and Physical Exam.   Edwena Bunde, RDMS, RVT

## 2017-11-21 ENCOUNTER — Ambulatory Visit: Payer: Commercial Managed Care - PPO | Admitting: Family Medicine

## 2017-11-21 ENCOUNTER — Encounter: Payer: Self-pay | Admitting: Family Medicine

## 2017-11-21 VITALS — BP 111/73 | HR 69 | Temp 98.6°F | Wt 152.4 lb

## 2017-11-21 DIAGNOSIS — H60331 Swimmer's ear, right ear: Secondary | ICD-10-CM | POA: Diagnosis not present

## 2017-11-21 MED ORDER — CIPROFLOXACIN-DEXAMETHASONE 0.3-0.1 % OT SUSP: 4.0000 [drp] | Freq: Two times a day (BID) | OTIC | 0 refills | Status: DC

## 2017-11-21 NOTE — Progress Notes (Signed)
BP 111/73 (BP Location: Right Arm, Patient Position: Sitting, Cuff Size: Normal)   Pulse 69   Temp 98.6 F (37 C)   Wt 152 lb 7 oz (69.1 kg)   SpO2 99%   BMI 23.88 kg/m    Subjective:    Patient ID: Gabrielle Jackson, female    DOB: March 24, 1991, 27 y.o.   MRN: [id number]  HPI: Gabrielle Jackson is a 27 y.o. female  Chief Complaint  Patient presents with  . Ear Pain    right   EAR PAIN Duration: February off and on Involved ear(s): right Severity:  severe  Quality:  sharp, dull, aching, pressure-like and stabbing Fever: yes Otorrhea: no Upper respiratory infection symptoms: yes Pruritus: yes Hearing loss: no Water immersion yes Using Q-tips: no Recurrent otitis media: no Status: worse Treatments attempted: daughter's drops   Relevant past medical, surgical, family and social history reviewed and updated as indicated. Interim medical history since our last visit reviewed. Allergies and medications reviewed and updated.  Review of Systems  Constitutional: Negative.   HENT: Positive for ear discharge, ear pain and sore throat. Negative for congestion, dental problem, drooling, facial swelling, hearing loss, mouth sores, nosebleeds, postnasal drip, rhinorrhea, sinus pressure, sinus pain, sneezing, tinnitus, trouble swallowing and voice change.   Eyes: Negative.   Respiratory: Negative.   Cardiovascular: Negative.   Psychiatric/Behavioral: Negative.     Per HPI unless specifically indicated above     Objective:    BP 111/73 (BP Location: Right Arm, Patient Position: Sitting, Cuff Size: Normal)   Pulse 69   Temp 98.6 F (37 C)   Wt 152 lb 7 oz (69.1 kg)   SpO2 99%   BMI 23.88 kg/m   Wt Readings from Last 3 Encounters:  11/21/17 152 lb 7 oz (69.1 kg)  10/23/17 150 lb (68 kg)  10/17/17 147 lb (66.7 kg)    Physical Exam  Constitutional: She is oriented to person, place, and time. She appears well-developed and well-nourished. No distress.  HENT:  Head:  Normocephalic and atraumatic.  Right Ear: Hearing and external ear normal. There is swelling and tenderness. Tympanic membrane is erythematous.  Left Ear: Hearing, tympanic membrane, external ear and ear canal normal.  Nose: Nose normal.  Mouth/Throat: Uvula is midline, oropharynx is clear and moist and mucous membranes are normal. No oropharyngeal exudate.  Eyes: Pupils are equal, round, and reactive to light. Conjunctivae, EOM and lids are normal. Right eye exhibits no discharge. Left eye exhibits no discharge. No scleral icterus.  Neck: Normal range of motion. Neck supple. No JVD present. No tracheal deviation present. No thyromegaly present.  Cardiovascular: Normal rate, regular rhythm, normal heart sounds and intact distal pulses. Exam reveals no gallop and no friction rub.  No murmur heard. Pulmonary/Chest: Effort normal and breath sounds normal. No stridor. No respiratory distress. She has no wheezes. She has no rales. She exhibits no tenderness.  Musculoskeletal: Normal range of motion.  Lymphadenopathy:    She has no cervical adenopathy.  Neurological: She is alert and oriented to person, place, and time.  Skin: Skin is warm, dry and intact. Capillary refill takes less than 2 seconds. No rash noted. She is not diaphoretic. No erythema. No pallor.  Psychiatric: She has a normal mood and affect. Her speech is normal and behavior is normal. Judgment and thought content normal. Cognition and memory are normal.    Results for orders placed or performed in visit on 10/23/17  IGP, rfx Aptima HPV ASCU  Result Value Ref Range   DIAGNOSIS: Comment    Specimen adequacy: Comment    Clinician Provided ICD10 Comment    Performed by: Comment    PAP Smear Comment .    Note: Comment    Test Methodology Comment    PAP Reflex Comment       Assessment & Plan:   Problem List Items Addressed This Visit    None    Visit Diagnoses    Acute swimmer's ear of right side    -  Primary   Will treat  with ciprodex. Call with any concerns.        Follow up plan: Return if symptoms worsen or fail to improve.

## 2017-12-01 ENCOUNTER — Other Ambulatory Visit: Payer: Self-pay | Admitting: Family Medicine

## 2017-12-15 ENCOUNTER — Encounter: Payer: Self-pay | Admitting: Family Medicine

## 2018-01-07 ENCOUNTER — Encounter: Payer: Self-pay | Admitting: Family Medicine

## 2018-01-27 ENCOUNTER — Encounter: Payer: Self-pay | Admitting: Family Medicine

## 2018-01-28 NOTE — Telephone Encounter (Signed)
Tried to call pt, no answer, no VM. 

## 2018-01-29 ENCOUNTER — Encounter: Payer: Self-pay | Admitting: Family Medicine

## 2018-01-29 ENCOUNTER — Other Ambulatory Visit: Payer: Self-pay

## 2018-01-29 ENCOUNTER — Ambulatory Visit: Payer: Commercial Managed Care - PPO | Admitting: Family Medicine

## 2018-01-29 VITALS — BP 108/70 | HR 72 | Temp 98.5°F | Ht 67.0 in | Wt 156.1 lb

## 2018-01-29 DIAGNOSIS — H6981 Other specified disorders of Eustachian tube, right ear: Secondary | ICD-10-CM

## 2018-01-29 MED ORDER — PREDNISONE 50 MG PO TABS: 50.0000 mg | ORAL_TABLET | Freq: Every day | ORAL | 0 refills | Status: DC

## 2018-01-29 NOTE — Progress Notes (Signed)
BP 108/70   Pulse 72   Temp 98.5 F (36.9 C) (Oral)   Ht 5\' 7"  (1.702 m)   Wt 156 lb 1.6 oz (70.8 kg)   SpO2 99%   BMI 24.45 kg/m    Subjective:    Patient ID: Gabrielle Jackson, female    DOB: 1990/08/13, 27 y.o.   MRN: [id number]  HPI: Gabrielle Jackson is a 27 y.o. female  Chief Complaint  Patient presents with  . Ear Pain    right, x 2 weeks   EAG CLOGGED Duration: 2 weeks Involved ear(s):  "right Sensation of feeling clogged/plugged: yes Decreased/muffled hearing:yes Ear pain: no Fever: no Otorrhea: no Hearing loss: yes Upper respiratory infection symptoms: yes Using Q-Tips: no Status: stable History of cerumenosis: no Treatments attempted: none  Relevant past medical, surgical, family and social history reviewed and updated as indicated. Interim medical history since our last visit reviewed. Allergies and medications reviewed and updated.  Review of Systems  Constitutional: Negative.   HENT: Positive for congestion and ear pain. Negative for dental problem, drooling, ear discharge, facial swelling, hearing loss, mouth sores, nosebleeds, postnasal drip, rhinorrhea, sinus pressure, sinus pain, sneezing, sore throat, tinnitus, trouble swallowing and voice change.   Respiratory: Negative.   Cardiovascular: Negative.   Musculoskeletal: Negative for arthralgias, back pain, gait problem, joint swelling, myalgias, neck pain and neck stiffness.  Skin: Negative.   Neurological: Negative for dizziness, tremors, seizures, syncope, facial asymmetry, speech difficulty, weakness, light-headedness, numbness and headaches.  Psychiatric/Behavioral: Negative.     Per HPI unless specifically indicated above     Objective:    BP 108/70   Pulse 72   Temp 98.5 F (36.9 C) (Oral)   Ht 5\' 7"  (1.702 m)   Wt 156 lb 1.6 oz (70.8 kg)   SpO2 99%   BMI 24.45 kg/m   Wt Readings from Last 3 Encounters:  01/29/18 156 lb 1.6 oz (70.8 kg)  11/21/17 152 lb 7 oz (69.1 kg)    10/23/17 150 lb (68 kg)    Physical Exam  Constitutional: She is oriented to person, place, and time. She appears well-developed and well-nourished. No distress.  HENT:  Head: Normocephalic and atraumatic.  Right Ear: Hearing and external ear normal.  Left Ear: Hearing and external ear normal.  Nose: Nose normal.  Mouth/Throat: Oropharynx is clear and moist. No oropharyngeal exudate.  Eyes: Pupils are equal, round, and reactive to light. Conjunctivae, EOM and lids are normal. Right eye exhibits no discharge. Left eye exhibits no discharge. No scleral icterus.  Neck: Normal range of motion. Neck supple. No JVD present. No tracheal deviation present. No thyromegaly present.  Cardiovascular: Normal rate, regular rhythm, normal heart sounds and intact distal pulses. Exam reveals no gallop and no friction rub.  No murmur heard. Pulmonary/Chest: Effort normal and breath sounds normal. No stridor. No respiratory distress. She has no wheezes. She has no rales. She exhibits no tenderness.  Musculoskeletal: Normal range of motion.  Lymphadenopathy:    She has no cervical adenopathy.  Neurological: She is alert and oriented to person, place, and time.  Skin: Skin is warm, dry and intact. Capillary refill takes less than 2 seconds. No rash noted. She is not diaphoretic. No erythema. No pallor.  Psychiatric: She has a normal mood and affect. Her speech is normal and behavior is normal. Judgment and thought content normal. Cognition and memory are normal.  Nursing note and vitals reviewed.   Results for orders placed or performed  in visit on 10/23/17  IGP, rfx Aptima HPV ASCU  Result Value Ref Range   DIAGNOSIS: Comment    Specimen adequacy: Comment    Clinician Provided ICD10 Comment    Performed by: Comment    PAP Smear Comment .    Note: Comment    Test Methodology Comment    PAP Reflex Comment       Assessment & Plan:   Problem List Items Addressed This Visit    None    Visit  Diagnoses    Dysfunction of right eustachian tube    -  Primary   Will treat with burst of prednisone. Has been having this recurrently. Will get her into see ENT. Referral generated today. Call with any concerns.    Relevant Orders   Ambulatory referral to ENT       Follow up plan: Return if symptoms worsen or fail to improve, for physical.

## 2018-02-12 ENCOUNTER — Encounter: Payer: Self-pay | Admitting: Family Medicine

## 2018-02-26 ENCOUNTER — Other Ambulatory Visit: Payer: Self-pay | Admitting: Otolaryngology

## 2018-02-26 DIAGNOSIS — H7291 Unspecified perforation of tympanic membrane, right ear: Secondary | ICD-10-CM

## 2018-03-04 ENCOUNTER — Ambulatory Visit
Admission: RE | Admit: 2018-03-04 | Discharge: 2018-03-04 | Disposition: A | Discharge status: Home / Self Care | Payer: Commercial Managed Care - PPO | Source: Ambulatory Visit | Attending: Otolaryngology | Admitting: Otolaryngology

## 2018-03-04 DIAGNOSIS — H7291 Unspecified perforation of tympanic membrane, right ear: Secondary | ICD-10-CM | POA: Diagnosis present

## 2018-03-20 NOTE — Discharge Instructions (Signed)
General Anesthesia, Adult, Care After °These instructions provide you with information about caring for yourself after your procedure. Your health care provider may also give you more specific instructions. Your treatment has been planned according to current medical practices, but problems sometimes occur. Call your health care provider if you have any problems or questions after your procedure. °What can I expect after the procedure? °After the procedure, it is common to have: °· Vomiting. °· A sore throat. °· Mental slowness. ° °It is common to feel: °· Nauseous. °· Cold or shivery. °· Sleepy. °· Tired. °· Sore or achy, even in parts of your body where you did not have surgery. ° °Follow these instructions at home: °For at least 24 hours after the procedure: °· Do not: °? Participate in activities where you could fall or become injured. °? Drive. °? Use heavy machinery. °? Drink alcohol. °? Take sleeping pills or medicines that cause drowsiness. °? Make important decisions or sign legal documents. °? Take care of children on your own. °· Rest. °Eating and drinking °· If you vomit, drink water, juice, or soup when you can drink without vomiting. °· Drink enough fluid to keep your urine clear or pale yellow. °· Make sure you have little or no nausea before eating solid foods. °· Follow the diet recommended by your health care provider. °General instructions °· Have a responsible adult stay with you until you are awake and alert. °· Return to your normal activities as told by your health care provider. Ask your health care provider what activities are safe for you. °· Take over-the-counter and prescription medicines only as told by your health care provider. °· If you smoke, do not smoke without supervision. °· Keep all follow-up visits as told by your health care provider. This is important. °Contact a health care provider if: °· You continue to have nausea or vomiting at home, and medicines are not helpful. °· You  cannot drink fluids or start eating again. °· You cannot urinate after 8-12 hours. °· You develop a skin rash. °· You have fever. °· You have increasing redness at the site of your procedure. °Get help right away if: °· You have difficulty breathing. °· You have chest pain. °· You have unexpected bleeding. °· You feel that you are having a life-threatening or urgent problem. °This information is not intended to replace advice given to you by your health care provider. Make sure you discuss any questions you have with your health care provider. °Document Released: 09/24/2000 Document Revised: 11/21/2015 Document Reviewed: 06/02/2015 °Elsevier Interactive Patient Education © 2018 [employer] ° °

## 2018-03-21 ENCOUNTER — Ambulatory Visit
Admission: RE | Admit: 2018-03-21 | Discharge: 2018-03-21 | Disposition: A | Discharge status: Home / Self Care | Payer: Commercial Managed Care - PPO | Source: Ambulatory Visit | Attending: Unknown Physician Specialty | Admitting: Unknown Physician Specialty

## 2018-03-21 ENCOUNTER — Ambulatory Visit: Payer: Commercial Managed Care - PPO | Admitting: Anesthesiology

## 2018-03-21 ENCOUNTER — Encounter
Admission: RE | Disposition: A | Discharge status: Home / Self Care | Payer: Self-pay | Source: Ambulatory Visit | Attending: Unknown Physician Specialty

## 2018-03-21 DIAGNOSIS — H7291 Unspecified perforation of tympanic membrane, right ear: Secondary | ICD-10-CM | POA: Insufficient documentation

## 2018-03-21 DIAGNOSIS — Z880 Allergy status to penicillin: Secondary | ICD-10-CM | POA: Insufficient documentation

## 2018-03-21 DIAGNOSIS — F419 Anxiety disorder, unspecified: Secondary | ICD-10-CM | POA: Insufficient documentation

## 2018-03-21 HISTORY — PX: TYMPANOPLASTY: SHX33

## 2018-03-21 HISTORY — DX: Motion sickness, initial encounter: T75.3XXA

## 2018-03-21 SURGERY — TYMPANOPLASTY
Anesthesia: General | Site: Ear | Laterality: Right

## 2018-03-21 MED ORDER — LIDOCAINE-EPINEPHRINE 1 %-1:100000 IJ SOLN
INTRAMUSCULAR | Status: DC | PRN
  Administered 2018-03-21: .5 mL

## 2018-03-21 MED ORDER — LIDOCAINE HCL (CARDIAC) PF 100 MG/5ML IV SOSY
PREFILLED_SYRINGE | INTRAVENOUS | Status: DC | PRN
  Administered 2018-03-21: 40 mg via INTRATRACHEAL

## 2018-03-21 MED ORDER — GELATIN ABSORBABLE 12-7 MM EX MISC
CUTANEOUS | Status: DC | PRN
  Administered 2018-03-21: 1 via TOPICAL

## 2018-03-21 MED ORDER — FENTANYL CITRATE (PF) 100 MCG/2ML IJ SOLN
25.0000 ug | INTRAMUSCULAR | Status: DC | PRN
  Administered 2018-03-21: 25 ug via INTRAVENOUS

## 2018-03-21 MED ORDER — GLYCOPYRROLATE 0.2 MG/ML IJ SOLN
INTRAMUSCULAR | Status: DC | PRN
  Administered 2018-03-21: .1 mg via INTRAVENOUS

## 2018-03-21 MED ORDER — ONDANSETRON HCL 4 MG/2ML IJ SOLN
INTRAMUSCULAR | Status: DC | PRN
  Administered 2018-03-21: 4 mg via INTRAVENOUS

## 2018-03-21 MED ORDER — OXYCODONE HCL 5 MG PO TABS: 5.0000 mg | ORAL_TABLET | Freq: Once | ORAL | Status: AC | PRN

## 2018-03-21 MED ORDER — DEXAMETHASONE SODIUM PHOSPHATE 4 MG/ML IJ SOLN
INTRAMUSCULAR | Status: DC | PRN
  Administered 2018-03-21: 8 mg via INTRAVENOUS

## 2018-03-21 MED ORDER — LACTATED RINGERS IV SOLN
1000.0000 mL | INTRAVENOUS | Status: DC
  Administered 2018-03-21: 1000 mL via INTRAVENOUS

## 2018-03-21 MED ORDER — ACETAMINOPHEN 325 MG PO TABS
975.0000 mg | ORAL_TABLET | Freq: Once | ORAL | Status: AC
  Administered 2018-03-21: 975 mg via ORAL

## 2018-03-21 MED ORDER — MIDAZOLAM HCL 5 MG/5ML IJ SOLN
INTRAMUSCULAR | Status: DC | PRN
  Administered 2018-03-21: 2 mg via INTRAVENOUS

## 2018-03-21 MED ORDER — PROPOFOL 10 MG/ML IV BOLUS
INTRAVENOUS | Status: DC | PRN
  Administered 2018-03-21: 130 mg via INTRAVENOUS

## 2018-03-21 MED ORDER — HYDROCODONE-ACETAMINOPHEN 5-300 MG PO TABS: 1.0000 | ORAL_TABLET | ORAL | 0 refills | Status: DC | PRN

## 2018-03-21 MED ORDER — OXYCODONE HCL 5 MG/5ML PO SOLN
5.0000 mg | Freq: Once | ORAL | Status: AC | PRN
  Administered 2018-03-21: 5 mg via ORAL

## 2018-03-21 MED ORDER — FENTANYL CITRATE (PF) 100 MCG/2ML IJ SOLN
INTRAMUSCULAR | Status: DC | PRN
  Administered 2018-03-21: 25 ug via INTRAVENOUS
  Administered 2018-03-21: 50 ug via INTRAVENOUS
  Administered 2018-03-21 (×2): 12.5 ug via INTRAVENOUS

## 2018-03-21 MED ORDER — SCOPOLAMINE 1 MG/3DAYS TD PT72
1.0000 | MEDICATED_PATCH | TRANSDERMAL | Status: DC
  Administered 2018-03-21: 1.5 mg via TRANSDERMAL

## 2018-03-21 SURGICAL SUPPLY — 25 items
ADHESIVE MASTISOL STRL (MISCELLANEOUS) ×4 IMPLANT
BLADE EAR TYMPAN 2.5 60D BEAV (BLADE) ×2 IMPLANT
CANISTER SUCT 1200ML W/VALVE (MISCELLANEOUS) ×2 IMPLANT
CLEANER CAUTERY TIP 5X5 PAD (MISCELLANEOUS) ×1 IMPLANT
COTTONBALL LRG STERILE PKG (GAUZE/BANDAGES/DRESSINGS) ×2 IMPLANT
DRAPE HEAD BAR (DRAPES) ×2 IMPLANT
DRAPE MICROSCOPE ZEISS INVISI (DRAPES) ×2 IMPLANT
DRAPE SURG 17X11 SM STRL (DRAPES) ×4 IMPLANT
GLOVE BIO SURGEON STRL SZ7.5 (GLOVE) ×4 IMPLANT
GLOVE SURG TRIUMPH 8.0 PF LTX (GLOVE) ×2 IMPLANT
KIT TURNOVER KIT A (KITS) ×2 IMPLANT
NDL HYPO 25GX1X1/2 BEV (NEEDLE) ×1 IMPLANT
NEEDLE HYPO 25GX1X1/2 BEV (NEEDLE) ×2 IMPLANT
NS IRRIG 500ML POUR BTL (IV SOLUTION) ×2 IMPLANT
PACK ENT CUSTOM (PACKS) ×2 IMPLANT
PAD CLEANER CAUTERY TIP 5X5 (MISCELLANEOUS) ×1
SLEEVE PROTECTION STRL DISP (MISCELLANEOUS) ×4 IMPLANT
SOL PREP PVP 2OZ (MISCELLANEOUS) ×2
SOLUTION PREP PVP 2OZ (MISCELLANEOUS) ×1 IMPLANT
STAPLER SKIN PROX 35W (STAPLE) ×2 IMPLANT
STRAP BODY AND KNEE 60X3 (MISCELLANEOUS) ×2 IMPLANT
SUT CHROMIC 5 0 P 3 (SUTURE) ×1 IMPLANT
SUT PLAIN GUT FAST 5-0 (SUTURE) ×2 IMPLANT
SYR 3ML LL SCALE MARK (SYRINGE) ×4 IMPLANT
TOWEL OR 17X26 4PK STRL BLUE (TOWEL DISPOSABLE) ×2 IMPLANT

## 2018-03-21 NOTE — Anesthesia Procedure Notes (Signed)
Procedure Name: LMA Insertion Date/Time: 03/21/2018 11:22 AM Performed by: Mayme Genta, CRNA Pre-anesthesia Checklist: Patient identified, Emergency Drugs available, Suction available, Timeout performed and Patient being monitored Patient Re-evaluated:Patient Re-evaluated prior to induction Oxygen Delivery Method: Circle system utilized Preoxygenation: Pre-oxygenation with 100% oxygen Induction Type: IV induction LMA: LMA inserted LMA Size: 4.0 Number of attempts: 1 Placement Confirmation: positive ETCO2 and breath sounds checked- equal and bilateral Tube secured with: Tape

## 2018-03-21 NOTE — Transfer of Care (Signed)
Immediate Anesthesia Transfer of Care Note  Patient: Gabrielle Jackson  Procedure(s) Performed: TYMPANOPLASTY WITH GRAFT (Right Ear)  Patient Location: PACU  Anesthesia Type: General  Level of Consciousness: awake, alert  and patient cooperative  Airway and Oxygen Therapy: Patient Spontanous Breathing and Patient connected to supplemental oxygen  Post-op Assessment: Post-op Vital signs reviewed, Patient's Cardiovascular Status Stable, Respiratory Function Stable, Patent Airway and No signs of Nausea or vomiting  Post-op Vital Signs: Reviewed and stable  Complications: No apparent anesthesia complications

## 2018-03-21 NOTE — Op Note (Signed)
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  After general laryngeal mask anesthesia the table return was turned 90 degrees.  Local anesthetic 1% lidocaine with 1 100,000 units of epinephrine was used to inject the tragus.  A total of 1 cc was used.  The ear was then prepped and draped sterilely.  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  A straight needle was then used to rim the perforation to remove the epithelial tract to promote healing.  A cottonball with adrenaline was then placed against the perforation.  The operation then turned to the harvest of the tragal perichondrial graft.  Incision was made along the leading edge of the tragus and the tragal perichondrial graft was harvested in standard fashion.  This was placed in the fascia press.  The tragal incision was closed using interrupted 5-0 chromic.  The ear canal was readdressed the cottonball with adrenaline was removed with several small adhesions of the middle ear which were lysed using the curved needle.  The middle ear space was then packed with Gelfoam.  The tragal perichondrial graft was laid in a medial underlay fashion beneath all edges of the perforation.  This gave excellent closure of the perforation.  The ear canal was then filled with bacitracin ointment followed by cottonball.  The patient was returned to anesthesia where she was awakened in the operating room taken to recovery room in stable condition.  Dispo:    Good  Plan: Discharged home keep the ear dry follow-up 2 weeks  Roena Malady  03/21/2018 12:05 PM

## 2018-03-21 NOTE — Anesthesia Preprocedure Evaluation (Signed)
Anesthesia Evaluation  Patient identified by MRN, date of birth, ID band Patient awake    Reviewed: Allergy & Precautions, NPO status , Patient's Chart, lab work & pertinent test results, reviewed documented beta blocker date and time   Airway Mallampati: I  TM Distance: >3 FB Neck ROM: Full    Dental no notable dental hx.    Pulmonary neg pulmonary ROS,    Pulmonary exam normal breath sounds clear to auscultation       Cardiovascular negative cardio ROS Normal cardiovascular exam Rhythm:Regular Rate:Normal     Neuro/Psych PSYCHIATRIC DISORDERS Anxiety    GI/Hepatic negative GI ROS,   Endo/Other  negative endocrine ROS  Renal/GU negative Renal ROS  negative genitourinary   Musculoskeletal negative musculoskeletal ROS (+)   Abdominal Normal abdominal exam  (+)   Peds  Hematology negative hematology ROS (+)   Anesthesia Other Findings   Reproductive/Obstetrics                             Anesthesia Physical Anesthesia Plan  ASA: I  Anesthesia Plan: General   Post-op Pain Management:    Induction: Intravenous  PONV Risk Score and Plan:   Airway Management Planned: LMA  Additional Equipment: None  Intra-op Plan:   Post-operative Plan:   Informed Consent: I have reviewed the patients History and Physical, chart, labs and discussed the procedure including the risks, benefits and alternatives for the proposed anesthesia with the patient or authorized representative who has indicated his/her understanding and acceptance.     Plan Discussed with: CRNA, Surgeon and Anesthesiologist  Anesthesia Plan Comments:         Anesthesia Quick Evaluation

## 2018-03-21 NOTE — H&P (Signed)
The patient's history has been reviewed, patient examined, no change in status, stable for surgery.  Questions were answered to the patients satisfaction.  

## 2018-03-21 NOTE — Anesthesia Postprocedure Evaluation (Signed)
Anesthesia Post Note  Patient: Gabrielle Jackson  Procedure(s) Performed: TYMPANOPLASTY WITH GRAFT (Right Ear)  Patient location during evaluation: PACU Anesthesia Type: General Level of consciousness: awake Pain management: pain level controlled Vital Signs Assessment: post-procedure vital signs reviewed and stable Respiratory status: spontaneous breathing Cardiovascular status: blood pressure returned to baseline Postop Assessment: no headache Anesthetic complications: no    Lavonna Monarch

## 2018-05-05 ENCOUNTER — Other Ambulatory Visit: Payer: Self-pay | Admitting: Family Medicine

## 2018-05-05 NOTE — Telephone Encounter (Signed)
Requested medication (s) are due for refill today: Yes  Requested medication (s) are on the active medication list: Yes  Last refill:  12/03/17  Future visit scheduled: No  Notes to clinic:  Unable to refill per protocol, failed SNRI and no labs.     Requested Prescriptions  Pending Prescriptions Disp Refills   venlafaxine XR (EFFEXOR-XR) 37.5 MG 24 hr capsule [Pharmacy Med Name: VENLAFAXINE HCL ER 37.5 MG CAP] 30 capsule 3    Sig: TAKE 1 CAPSULE (37.5 MG TOTAL) BY MOUTH DAILY WITH BREAKFAST.     Psychiatry: Antidepressants - SNRI - desvenlafaxine & venlafaxine Failed - 05/05/2018  2:29 AM      Failed - LDL in normal range and within 360 days    No results found for: LDLCALC, LDLC, HIRISKLDL       Failed - Total Cholesterol in normal range and within 360 days    No results found for: CHOL, POCCHOL       Failed - Triglycerides in normal range and within 360 days    No results found for: TRIG       Passed - Last BP in normal range    BP Readings from Last 1 Encounters:  03/21/18 110/80         Passed - Valid encounter within last 6 months    Recent Outpatient Visits          3 months ago Dysfunction of right eustachian tube   [facility], Megan P, DO   5 months ago Acute swimmer's ear of right side   [facility] [place], Megan P, DO   6 months ago Rib pain on right side   [place], [place], [place]   8 months ago Right acute serous otitis media, recurrence not specified   [place], Megan P, DO   9 months ago Generalized anxiety disorder   [place], [place], [place]

## 2018-05-14 ENCOUNTER — Encounter: Payer: Self-pay | Admitting: Family Medicine

## 2018-05-14 MED ORDER — VENLAFAXINE HCL ER 75 MG PO CP24: 75.0000 mg | ORAL_CAPSULE | Freq: Every day | ORAL | 3 refills | Status: DC

## 2018-06-04 ENCOUNTER — Encounter: Payer: Self-pay | Admitting: Family Medicine

## 2018-06-04 DIAGNOSIS — Z13220 Encounter for screening for lipoid disorders: Secondary | ICD-10-CM

## 2018-06-04 DIAGNOSIS — R5382 Chronic fatigue, unspecified: Secondary | ICD-10-CM

## 2018-06-05 ENCOUNTER — Encounter: Payer: Self-pay | Admitting: Family Medicine

## 2018-06-06 ENCOUNTER — Ambulatory Visit: Payer: Commercial Managed Care - PPO | Admitting: Obstetrics and Gynecology

## 2018-06-11 ENCOUNTER — Encounter: Payer: Self-pay | Admitting: Family Medicine

## 2018-06-11 ENCOUNTER — Ambulatory Visit (INDEPENDENT_AMBULATORY_CARE_PROVIDER_SITE_OTHER): Payer: Commercial Managed Care - PPO | Admitting: Family Medicine

## 2018-06-11 VITALS — BP 114/74 | HR 68 | Temp 98.2°F | Ht 67.0 in | Wt 159.4 lb

## 2018-06-11 DIAGNOSIS — R5383 Other fatigue: Secondary | ICD-10-CM | POA: Diagnosis not present

## 2018-06-11 DIAGNOSIS — F411 Generalized anxiety disorder: Secondary | ICD-10-CM | POA: Diagnosis not present

## 2018-06-11 DIAGNOSIS — Z13220 Encounter for screening for lipoid disorders: Secondary | ICD-10-CM | POA: Diagnosis not present

## 2018-06-11 MED ORDER — VENLAFAXINE HCL ER 150 MG PO CP24: 150.0000 mg | ORAL_CAPSULE | Freq: Every day | ORAL | 3 refills | Status: DC

## 2018-06-11 MED ORDER — HYDROXYZINE HCL 25 MG PO TABS: 12.5000 mg | ORAL_TABLET | Freq: Three times a day (TID) | ORAL | 0 refills | Status: DC | PRN

## 2018-06-11 NOTE — Progress Notes (Signed)
BP 114/74 (BP Location: Left Arm, Patient Position: Sitting, Cuff Size: Normal)   Pulse 68   Temp 98.2 F (36.8 C)   Ht 5\' 7"  (1.702 m)   Wt 159 lb 6 oz (72.3 kg)   SpO2 100%   BMI 24.96 kg/m    Subjective:    Patient ID: Gabrielle Jackson, female    DOB: Aug 12, 1990, 27 y.o.   MRN: [id number]  HPI: Gabrielle Jackson is a 27 y.o. female  Chief Complaint  Patient presents with  . Anxiety  . Cough   ANXIETY/STRESS- mom just diagnosed with acute leukemia and is in the hospital. Has a lot going on. Under a lot of stress. Feels like her medicine is not working.  Duration:worse Anxious mood: yes  Excessive worrying: yes Irritability: yes  Sweating: yes Nausea: yes Palpitations:yes Hyperventilation: no Panic attacks: yes Agoraphobia: no  Obscessions/compulsions: no Depressed mood: yes Depression screen [facility] 2/9 06/11/2018 01/29/2018 07/22/2017 03/01/2017 02/04/2017  Decreased Interest 2 0 1 0 2  Down, Depressed, Hopeless 1 0 1 0 2  PHQ - 2 Score 3 0 2 0 4  Altered sleeping 2 0 3 1 3   Tired, decreased energy 3 1 3 1 3   Change in appetite 3 0 3 1 2   Feeling bad or failure about yourself  1 0 1 0 2  Trouble concentrating 2 0 1 0 1  Moving slowly or fidgety/restless 0 0 0 0 1  Suicidal thoughts 0 0 0 0 0  PHQ-9 Score 14 1 13 3 16   Difficult doing work/chores Somewhat difficult Not difficult at all - - Somewhat difficult   GAD 7 : Generalized Anxiety Score 06/11/2018 01/29/2018 07/22/2017 03/01/2017  Nervous, Anxious, on Edge 3 0 3 1  Control/stop worrying 2 0 3 1  Worry too much - different things 3 0 3 1  Trouble relaxing 2 0 3 1  Restless 1 0 3 2  Easily annoyed or irritable 3 0 3 0  Afraid - awful might happen 3 0 3 1  Total GAD 7 Score 17 0 21 7  Anxiety Difficulty Somewhat difficult Not difficult at all - Somewhat difficult   Anhedonia: no Weight changes: no Insomnia: yes hard to fall asleep  Hypersomnia: no Fatigue/loss of energy: yes Feelings of worthlessness:  yes Feelings of guilt: yes Impaired concentration/indecisiveness: yes Suicidal ideations: no  Crying spells: yes Recent Stressors/Life Changes: yes   Relationship problems: no   Family stress: yes     Financial stress: yes    Job stress: yes    Recent death/loss: no  Relevant past medical, surgical, family and social history reviewed and updated as indicated. Interim medical history since our last visit reviewed. Allergies and medications reviewed and updated.  Review of Systems  Constitutional: Negative.   Respiratory: Negative.   Cardiovascular: Negative.   Skin: Negative.   Neurological: Negative.   Psychiatric/Behavioral: Positive for dysphoric mood. Negative for agitation, behavioral problems, confusion, decreased concentration, hallucinations, self-injury, sleep disturbance and suicidal ideas. The patient is nervous/anxious. The patient is not hyperactive.     Per HPI unless specifically indicated above     Objective:    BP 114/74 (BP Location: Left Arm, Patient Position: Sitting, Cuff Size: Normal)   Pulse 68   Temp 98.2 F (36.8 C)   Ht 5\' 7"  (1.702 m)   Wt 159 lb 6 oz (72.3 kg)   SpO2 100%   BMI 24.96 kg/m   Wt Readings from Last 3 Encounters:  06/11/18 159 lb 6 oz (72.3 kg)  03/21/18 157 lb (71.2 kg)  01/29/18 156 lb 1.6 oz (70.8 kg)    Physical Exam  Constitutional: She is oriented to person, place, and time. She appears well-developed and well-nourished. No distress.  HENT:  Head: Normocephalic and atraumatic.  Right Ear: Hearing normal.  Left Ear: Hearing normal.  Nose: Nose normal.  Eyes: Conjunctivae and lids are normal. Right eye exhibits no discharge. Left eye exhibits no discharge. No scleral icterus.  Cardiovascular: Normal rate, regular rhythm, normal heart sounds and intact distal pulses. Exam reveals no gallop and no friction rub.  No murmur heard. Pulmonary/Chest: Effort normal and breath sounds normal. No stridor. No respiratory distress.  She has no wheezes. She has no rales. She exhibits no tenderness.  Musculoskeletal: Normal range of motion.  Neurological: She is alert and oriented to person, place, and time.  Skin: Skin is warm, dry and intact. Capillary refill takes less than 2 seconds. No rash noted. She is not diaphoretic. No erythema. No pallor.  Psychiatric: She has a normal mood and affect. Her speech is normal and behavior is normal. Judgment and thought content normal. Cognition and memory are normal.  Nursing note and vitals reviewed.   Results for orders placed or performed in visit on 10/23/17  IGP, rfx Aptima HPV ASCU  Result Value Ref Range   DIAGNOSIS: Comment    Specimen adequacy: Comment    Clinician Provided ICD10 Comment    Performed by: Comment    PAP Smear Comment .    Note: Comment    Test Methodology Comment    PAP Reflex Comment       Assessment & Plan:   Problem List Items Addressed This Visit      Other   Anxiety disorder - Primary    In exacerbation. Start hydroxyzine and increase effexor to 150mg . Recheck 1 month.       Relevant Medications   hydrOXYzine (ATARAX/VISTARIL) 25 MG tablet   venlafaxine XR (EFFEXOR-XR) 150 MG 24 hr capsule   Other Relevant Orders   CBC with Differential/Platelet   Comprehensive metabolic panel   TSH   UA/M w/rflx Culture, Routine    Other Visit Diagnoses    Other fatigue       Labs drawn today. Await results.    Relevant Orders   CBC with Differential/Platelet   Comprehensive metabolic panel   TSH   UA/M w/rflx Culture, Routine   VITAMIN D 25 Hydroxy (Vit-D Deficiency, Fractures)   Screening for cholesterol level       Labs drawn today. Await results.    Relevant Orders   Comprehensive metabolic panel   Lipid Panel w/o Chol/HDL Ratio       Follow up plan: Return in about 4 weeks (around 07/09/2018) for follow up mood.

## 2018-06-11 NOTE — Assessment & Plan Note (Signed)
In exacerbation. Start hydroxyzine and increase effexor to 150mg . Recheck 1 month.

## 2018-06-12 LAB — COMPREHENSIVE METABOLIC PANEL
A/G RATIO: 1.7 (ref 1.2–2.2)
ALBUMIN: 4.5 g/dL (ref 3.5–5.5)
ALT: 12 IU/L (ref 0–32)
AST: 17 IU/L (ref 0–40)
Alkaline Phosphatase: 81 IU/L (ref 39–117)
BUN/Creatinine Ratio: 12 (ref 9–23)
BUN: 10 mg/dL (ref 6–20)
Bilirubin Total: 0.3 mg/dL (ref 0.0–1.2)
CALCIUM: 9.6 mg/dL (ref 8.7–10.2)
CHLORIDE: 106 mmol/L (ref 96–106)
CO2: 23 mmol/L (ref 20–29)
Creatinine, Ser: 0.86 mg/dL (ref 0.57–1.00)
GFR calc Af Amer: 107 mL/min/{1.73_m2} (ref >59)
GFR calc non Af Amer: 93 mL/min/{1.73_m2} (ref >59)
GLUCOSE: 75 mg/dL (ref 65–99)
Globulin, Total: 2.6 g/dL (ref 1.5–4.5)
Potassium: 4 mmol/L (ref 3.5–5.2)
Sodium: 143 mmol/L (ref 134–144)
Total Protein: 7.1 g/dL (ref 6.0–8.5)

## 2018-06-12 LAB — CBC WITH DIFFERENTIAL/PLATELET
Basophils Absolute: 0 10*3/uL (ref 0.0–0.2)
Basos: 1 %
EOS (ABSOLUTE): 0.1 10*3/uL (ref 0.0–0.4)
EOS: 1 %
HEMATOCRIT: 42.9 % (ref 34.0–46.6)
Hemoglobin: 14.3 g/dL (ref 11.1–15.9)
IMMATURE GRANS (ABS): 0 10*3/uL (ref 0.0–0.1)
IMMATURE GRANULOCYTES: 0 %
LYMPHS: 29 %
Lymphocytes Absolute: 2.4 10*3/uL (ref 0.7–3.1)
MCH: 30.9 pg (ref 26.6–33.0)
MCHC: 33.3 g/dL (ref 31.5–35.7)
MCV: 93 fL (ref 79–97)
Monocytes Absolute: 0.5 10*3/uL (ref 0.1–0.9)
Monocytes: 6 %
NEUTROS PCT: 63 %
Neutrophils Absolute: 5.4 10*3/uL (ref 1.4–7.0)
Platelets: 347 10*3/uL (ref 150–450)
RBC: 4.63 x10E6/uL (ref 3.77–5.28)
RDW: 11.4 % — ABNORMAL LOW (ref 12.3–15.4)
WBC: 8.5 10*3/uL (ref 3.4–10.8)

## 2018-06-12 LAB — LIPID PANEL W/O CHOL/HDL RATIO
Cholesterol, Total: 94 mg/dL — ABNORMAL LOW (ref 100–199)
HDL: 43 mg/dL (ref >39)
LDL Calculated: 39 mg/dL (ref 0–99)
Triglycerides: 61 mg/dL (ref 0–149)
VLDL Cholesterol Cal: 12 mg/dL (ref 5–40)

## 2018-06-12 LAB — VITAMIN D 25 HYDROXY (VIT D DEFICIENCY, FRACTURES): VIT D 25 HYDROXY: 17 ng/mL — AB (ref 30.0–100.0)

## 2018-06-12 LAB — TSH: TSH: 0.968 u[IU]/mL (ref 0.450–4.500)

## 2018-06-17 ENCOUNTER — Encounter: Payer: Self-pay | Admitting: Family Medicine

## 2018-06-18 ENCOUNTER — Encounter: Payer: Self-pay | Admitting: Family Medicine

## 2018-06-20 MED ORDER — BUSPIRONE HCL 5 MG PO TABS: 5.0000 mg | ORAL_TABLET | Freq: Three times a day (TID) | ORAL | 1 refills | Status: DC

## 2018-06-23 ENCOUNTER — Encounter: Payer: Self-pay | Admitting: Family Medicine

## 2018-06-23 MED ORDER — AZITHROMYCIN 250 MG PO TABS: ORAL_TABLET | ORAL | 0 refills | Status: DC

## 2018-07-09 ENCOUNTER — Ambulatory Visit (INDEPENDENT_AMBULATORY_CARE_PROVIDER_SITE_OTHER): Payer: Commercial Managed Care - PPO | Admitting: Family Medicine

## 2018-07-09 ENCOUNTER — Encounter: Payer: Self-pay | Admitting: Family Medicine

## 2018-07-09 ENCOUNTER — Ambulatory Visit
Admission: RE | Admit: 2018-07-09 | Discharge: 2018-07-09 | Disposition: A | Discharge status: Home / Self Care | Payer: Commercial Managed Care - PPO | Source: Ambulatory Visit | Attending: Family Medicine | Admitting: Family Medicine

## 2018-07-09 VITALS — BP 116/75 | HR 66 | Temp 98.2°F | Ht 67.0 in | Wt 160.1 lb

## 2018-07-09 DIAGNOSIS — F411 Generalized anxiety disorder: Secondary | ICD-10-CM

## 2018-07-09 DIAGNOSIS — R609 Edema, unspecified: Secondary | ICD-10-CM | POA: Diagnosis present

## 2018-07-09 DIAGNOSIS — M79661 Pain in right lower leg: Secondary | ICD-10-CM

## 2018-07-09 DIAGNOSIS — R600 Localized edema: Secondary | ICD-10-CM

## 2018-07-09 MED ORDER — BUSPIRONE HCL 5 MG PO TABS: 5.0000 mg | ORAL_TABLET | Freq: Three times a day (TID) | ORAL | 1 refills | Status: DC

## 2018-07-09 MED ORDER — HYDROXYZINE HCL 25 MG PO TABS: 12.5000 mg | ORAL_TABLET | Freq: Three times a day (TID) | ORAL | 0 refills | Status: DC | PRN

## 2018-07-09 MED ORDER — VENLAFAXINE HCL ER 75 MG PO CP24: 75.0000 mg | ORAL_CAPSULE | Freq: Every day | ORAL | 1 refills | Status: DC

## 2018-07-09 NOTE — Assessment & Plan Note (Signed)
Under good control on current regimen. Continue current regimen. Continue to monitor. Call with any concerns. Refills given.   

## 2018-07-09 NOTE — Progress Notes (Signed)
BP 116/75 (BP Location: Right Arm, Patient Position: Sitting, Cuff Size: Normal)   Pulse 66   Temp 98.2 F (36.8 C)   Ht 5\' 7"  (1.702 m)   Wt 160 lb 1 oz (72.6 kg)   SpO2 99%   BMI 25.07 kg/m    Subjective:    Patient ID: [name], female    DOB: 1990-11-25, 28 y.o.   MRN: [id number]  HPI: KAZUE CERRO is a 28 y.o. female  Chief Complaint  Patient presents with  . Anxiety  . Depression  . Leg Swelling   ANXIETY/STRESS- still taking the 75- doing better. Feeling well.  Duration:better Anxious mood: yes  Excessive worrying: no Irritability: no  Sweating: no Nausea: no Palpitations:no Hyperventilation: no Panic attacks: no Agoraphobia: no  Obscessions/compulsions: no Depressed mood: no Depression screen [facility] 2/9 07/09/2018 06/11/2018 01/29/2018 07/22/2017 03/01/2017  Decreased Interest 0 2 0 1 0  Down, Depressed, Hopeless 0 1 0 1 0  PHQ - 2 Score 0 3 0 2 0  Altered sleeping 0 2 0 3 1  Tired, decreased energy 3 3 1 3 1   Change in appetite 0 3 0 3 1  Feeling bad or failure about yourself  0 1 0 1 0  Trouble concentrating 0 2 0 1 0  Moving slowly or fidgety/restless 0 0 0 0 0  Suicidal thoughts 0 0 0 0 0  PHQ-9 Score 3 14 1 13 3   Difficult doing work/chores Not difficult at all Somewhat difficult Not difficult at all - -   Anhedonia: no Weight changes: no Insomnia: no   Hypersomnia: no Fatigue/loss of energy: yes Feelings of worthlessness: no Feelings of guilt: no Impaired concentration/indecisiveness: no Suicidal ideations: no  Crying spells: no Recent Stressors/Life Changes: no   Relationship problems: no   Family stress: no     Financial stress: no    Job stress: no    Recent death/loss: no  No big long car trips, has a bruise on the back of her leg, pain in her R calf, has been having pain in   Relevant past medical, surgical, family and social history reviewed and updated as indicated. Interim medical history since our last visit  reviewed. Allergies and medications reviewed and updated.  Review of Systems  Constitutional: Negative.   Respiratory: Negative.   Cardiovascular: Positive for leg swelling. Negative for chest pain and palpitations.  Gastrointestinal: Negative.   Musculoskeletal: Negative.   Neurological: Negative.   Psychiatric/Behavioral: Negative.     Per HPI unless specifically indicated above     Objective:    BP 116/75 (BP Location: Right Arm, Patient Position: Sitting, Cuff Size: Normal)   Pulse 66   Temp 98.2 F (36.8 C)   Ht 5\' 7"  (1.702 m)   Wt 160 lb 1 oz (72.6 kg)   SpO2 99%   BMI 25.07 kg/m   Wt Readings from Last 3 Encounters:  07/09/18 160 lb 1 oz (72.6 kg)  06/11/18 159 lb 6 oz (72.3 kg)  03/21/18 157 lb (71.2 kg)    Physical Exam Vitals signs and nursing note reviewed.  Constitutional:      General: She is not in acute distress.    Appearance: Normal appearance. She is not ill-appearing, toxic-appearing or diaphoretic.  HENT:     Head: Normocephalic and atraumatic.     Right Ear: External ear normal.     Left Ear: External ear normal.     Nose: Nose normal.  Mouth/Throat:     Mouth: Mucous membranes are moist.     Pharynx: Oropharynx is clear.  Eyes:     General: No scleral icterus.       Right eye: No discharge.        Left eye: No discharge.     Extraocular Movements: Extraocular movements intact.     Conjunctiva/sclera: Conjunctivae normal.     Pupils: Pupils are equal, round, and reactive to light.  Neck:     Musculoskeletal: Normal range of motion and neck supple.  Cardiovascular:     Rate and Rhythm: Normal rate and regular rhythm.     Pulses: Normal pulses.     Heart sounds: Normal heart sounds. No murmur. No friction rub. No gallop.   Pulmonary:     Effort: Pulmonary effort is normal. No respiratory distress.     Breath sounds: Normal breath sounds. No stridor. No wheezing, rhonchi or rales.  Chest:     Chest wall: No tenderness.    Musculoskeletal: Normal range of motion.        General: Swelling and tenderness present.     Comments: + swelling and tenderness to R calf- +Homan's and squeeze.  Skin:    General: Skin is warm and dry.     Capillary Refill: Capillary refill takes less than 2 seconds.     Coloration: Skin is not jaundiced or pale.     Findings: Bruising (on back of R calf) present. No erythema, lesion or rash.  Neurological:     General: No focal deficit present.     Mental Status: She is alert and oriented to person, place, and time. Mental status is at baseline.  Psychiatric:        Mood and Affect: Mood normal.        Behavior: Behavior normal.        Thought Content: Thought content normal.        Judgment: Judgment normal.     Results for orders placed or performed in visit on 06/11/18  Microscopic Examination  Result Value Ref Range   WBC, UA 0-5 0 - 5 /hpf   RBC, UA None seen 0 - 2 /hpf   Epithelial Cells (non renal) 0-10 0 - 10 /hpf   Bacteria, UA None seen None seen/Few   Urinalysis Comments WILL FOLLOW   CBC with Differential/Platelet  Result Value Ref Range   WBC 8.5 3.4 - 10.8 x10E3/uL   RBC 4.63 3.77 - 5.28 x10E6/uL   Hemoglobin 14.3 11.1 - 15.9 g/dL   Hematocrit 42.9 34.0 - 46.6 %   MCV 93 79 - 97 fL   MCH 30.9 26.6 - 33.0 pg   MCHC 33.3 31.5 - 35.7 g/dL   RDW 11.4 (L) 12.3 - 15.4 %   Platelets 347 150 - 450 x10E3/uL   Neutrophils 63 Not Estab. %   Lymphs 29 Not Estab. %   Monocytes 6 Not Estab. %   Eos 1 Not Estab. %   Basos 1 Not Estab. %   Neutrophils Absolute 5.4 1.4 - 7.0 x10E3/uL   Lymphocytes Absolute 2.4 0.7 - 3.1 x10E3/uL   Monocytes Absolute 0.5 0.1 - 0.9 x10E3/uL   EOS (ABSOLUTE) 0.1 0.0 - 0.4 x10E3/uL   Basophils Absolute 0.0 0.0 - 0.2 x10E3/uL   Immature Granulocytes 0 Not Estab. %   Immature Grans (Abs) 0.0 0.0 - 0.1 x10E3/uL  Comprehensive metabolic panel  Result Value Ref Range   Glucose 75 65 - 99 mg/dL  BUN 10 6 - 20 mg/dL   Creatinine, Ser  0.86 0.57 - 1.00 mg/dL   GFR calc non Af Amer 93 >59 mL/min/1.73   GFR calc Af Amer 107 >59 mL/min/1.73   BUN/Creatinine Ratio 12 9 - 23   Sodium 143 134 - 144 mmol/L   Potassium 4.0 3.5 - 5.2 mmol/L   Chloride 106 96 - 106 mmol/L   CO2 23 20 - 29 mmol/L   Calcium 9.6 8.7 - 10.2 mg/dL   Total Protein 7.1 6.0 - 8.5 g/dL   Albumin 4.5 3.5 - 5.5 g/dL   Globulin, Total 2.6 1.5 - 4.5 g/dL   Albumin/Globulin Ratio 1.7 1.2 - 2.2   Bilirubin Total 0.3 0.0 - 1.2 mg/dL   Alkaline Phosphatase 81 39 - 117 IU/L   AST 17 0 - 40 IU/L   ALT 12 0 - 32 IU/L  Lipid Panel w/o Chol/HDL Ratio  Result Value Ref Range   Cholesterol, Total 94 (L) 100 - 199 mg/dL   Triglycerides 61 0 - 149 mg/dL   HDL 43 >39 mg/dL   VLDL Cholesterol Cal 12 5 - 40 mg/dL   LDL Calculated 39 0 - 99 mg/dL  TSH  Result Value Ref Range   TSH 0.968 0.450 - 4.500 uIU/mL  UA/M w/rflx Culture, Routine  Result Value Ref Range   Specific Gravity, UA 1.015 1.005 - 1.030   pH, UA 8.5 (H) 5.0 - 7.5   Color, UA Yellow Yellow   Appearance Ur CD Clear   Leukocytes, UA URTRA Negative   Protein, UA Negative Negative/Trace   Glucose, UA Negative Negative   Ketones, UA Negative Negative   RBC, UA Negative Negative   Bilirubin, UA Negative Negative   Urobilinogen, Ur 1.0 0.2 - 1.0 mg/dL   Nitrite, UA Negative Negative   Microscopic Examination See below:   VITAMIN D 25 Hydroxy (Vit-D Deficiency, Fractures)  Result Value Ref Range   Vit D, 25-Hydroxy 17.0 (L) 30.0 - 100.0 ng/mL      Assessment & Plan:   Problem List Items Addressed This Visit      Other   Anxiety disorder    Under good control on current regimen. Continue current regimen. Continue to monitor. Call with any concerns. Refills given.        Relevant Medications   hydrOXYzine (ATARAX/VISTARIL) 25 MG tablet   venlafaxine XR (EFFEXOR-XR) 75 MG 24 hr capsule    Other Visit Diagnoses    Peripheral edema    -  Primary   Will check for DVT- to go get [place] now.  Await results.    Relevant Orders   US Venous Img Lower Unilateral Right   Right calf pain       Will check for DVT- to go get [place] now. Await results.    Relevant Orders   US Venous Img Lower Unilateral Right       Follow up plan: Return if symptoms worsen or fail to improve.

## 2018-07-17 LAB — UA/M W/RFLX CULTURE, ROUTINE
Bilirubin, UA: NEGATIVE
Glucose, UA: NEGATIVE
Ketones, UA: NEGATIVE
Nitrite, UA: NEGATIVE
PH UA: 8.5 — AB (ref 5.0–7.5)
PROTEIN UA: NEGATIVE
RBC, UA: NEGATIVE
Specific Gravity, UA: 1.015 (ref 1.005–1.030)
UUROB: 1 mg/dL (ref 0.2–1.0)

## 2018-07-17 LAB — MICROSCOPIC EXAMINATION
Bacteria, UA: NONE SEEN
RBC, UA: NONE SEEN /hpf (ref 0–2)

## 2018-08-31 NOTE — Progress Notes (Signed)
BP 118/69   Pulse 67   Temp 98.3 F (36.8 C) (Oral)   Wt 162 lb 6.4 oz (73.7 kg)   LMP  (LMP Unknown)   SpO2 99%   BMI 25.44 kg/m    Subjective:    Patient ID: Gabrielle Jackson, female    DOB: December 09, 1990, 28 y.o.   MRN: [id number]  HPI: Gabrielle Jackson is a 28 y.o. female  Chief Complaint  Patient presents with  . Abdominal Pain    lower right side, started hurting last week and moving around to back   ABDOMINAL PAIN- has a known ovarian cyst on the R side- really acting up now  Duration: about a month Onset: gradual Severity: 6/10 Quality: sharp Location:  RLQ  Episode duration: constant Radiation: into her back Frequency: constant Alleviating factors: nothing Aggravating factors: nothing Status: worse Treatments attempted: tylenol Fever: no Nausea: no Vomiting: no Weight loss: no Decreased appetite: no Diarrhea: no Constipation: yes Blood in stool: no Heartburn: no Jaundice: no Rash: no Dysuria/urinary frequency: no Hematuria: no History of sexually transmitted disease: no Recurrent NSAID use: no  Relevant past medical, surgical, family and social history reviewed and updated as indicated. Interim medical history since our last visit reviewed. Allergies and medications reviewed and updated.  Review of Systems  Constitutional: Negative.   Respiratory: Negative.   Cardiovascular: Negative.   Gastrointestinal: Positive for constipation. Negative for abdominal distention, abdominal pain, anal bleeding, blood in stool, diarrhea, nausea, rectal pain and vomiting.  Genitourinary: Positive for pelvic pain. Negative for decreased urine volume, difficulty urinating, dyspareunia, dysuria, enuresis, flank pain, frequency, genital sores, hematuria, menstrual problem, urgency, vaginal bleeding, vaginal discharge and vaginal pain.  Musculoskeletal: Negative.   Skin: Negative.   Psychiatric/Behavioral: Negative.     Per HPI unless specifically indicated  above     Objective:    BP 118/69   Pulse 67   Temp 98.3 F (36.8 C) (Oral)   Wt 162 lb 6.4 oz (73.7 kg)   LMP  (LMP Unknown)   SpO2 99%   BMI 25.44 kg/m   Wt Readings from Last 3 Encounters:  09/01/18 162 lb 6.4 oz (73.7 kg)  07/09/18 160 lb 1 oz (72.6 kg)  06/11/18 159 lb 6 oz (72.3 kg)    Physical Exam Vitals signs and nursing note reviewed.  Constitutional:      General: She is not in acute distress.    Appearance: Normal appearance. She is not ill-appearing, toxic-appearing or diaphoretic.  HENT:     Head: Normocephalic and atraumatic.     Right Ear: External ear normal.     Left Ear: External ear normal.     Nose: Nose normal.     Mouth/Throat:     Mouth: Mucous membranes are moist.     Pharynx: Oropharynx is clear.  Eyes:     General: No scleral icterus.       Right eye: No discharge.        Left eye: No discharge.     Extraocular Movements: Extraocular movements intact.     Conjunctiva/sclera: Conjunctivae normal.     Pupils: Pupils are equal, round, and reactive to light.  Neck:     Musculoskeletal: Normal range of motion and neck supple.  Cardiovascular:     Rate and Rhythm: Normal rate and regular rhythm.     Pulses: Normal pulses.     Heart sounds: Normal heart sounds. No murmur. No friction rub. No gallop.   Pulmonary:  Effort: Pulmonary effort is normal. No respiratory distress.     Breath sounds: Normal breath sounds. No stridor. No wheezing, rhonchi or rales.  Chest:     Chest wall: No tenderness.  Abdominal:     General: Abdomen is flat. Bowel sounds are normal. There is no distension or abdominal bruit. There are no signs of injury.     Palpations: Abdomen is soft. There is no shifting dullness, fluid wave, hepatomegaly, splenomegaly, mass or pulsatile mass.     Tenderness: There is abdominal tenderness in the right lower quadrant. There is no right CVA tenderness, left CVA tenderness, guarding or rebound. Negative signs include Murphy's sign,  Rovsing's sign, McBurney's sign, psoas sign and obturator sign.  Musculoskeletal: Normal range of motion.  Skin:    General: Skin is warm and dry.     Capillary Refill: Capillary refill takes less than 2 seconds.     Coloration: Skin is not jaundiced or pale.     Findings: No bruising, erythema, lesion or rash.  Neurological:     General: No focal deficit present.     Mental Status: She is alert and oriented to person, place, and time. Mental status is at baseline.  Psychiatric:        Mood and Affect: Mood normal.        Behavior: Behavior normal.        Thought Content: Thought content normal.        Judgment: Judgment normal.     Results for orders placed or performed in visit on 06/11/18  Microscopic Examination  Result Value Ref Range   WBC, UA 0-5 0 - 5 /hpf   RBC, UA None seen 0 - 2 /hpf   Epithelial Cells (non renal) 0-10 0 - 10 /hpf   Bacteria, UA None seen None seen/Few  CBC with Differential/Platelet  Result Value Ref Range   WBC 8.5 3.4 - 10.8 x10E3/uL   RBC 4.63 3.77 - 5.28 x10E6/uL   Hemoglobin 14.3 11.1 - 15.9 g/dL   Hematocrit 42.9 34.0 - 46.6 %   MCV 93 79 - 97 fL   MCH 30.9 26.6 - 33.0 pg   MCHC 33.3 31.5 - 35.7 g/dL   RDW 11.4 (L) 12.3 - 15.4 %   Platelets 347 150 - 450 x10E3/uL   Neutrophils 63 Not Estab. %   Lymphs 29 Not Estab. %   Monocytes 6 Not Estab. %   Eos 1 Not Estab. %   Basos 1 Not Estab. %   Neutrophils Absolute 5.4 1.4 - 7.0 x10E3/uL   Lymphocytes Absolute 2.4 0.7 - 3.1 x10E3/uL   Monocytes Absolute 0.5 0.1 - 0.9 x10E3/uL   EOS (ABSOLUTE) 0.1 0.0 - 0.4 x10E3/uL   Basophils Absolute 0.0 0.0 - 0.2 x10E3/uL   Immature Granulocytes 0 Not Estab. %   Immature Grans (Abs) 0.0 0.0 - 0.1 x10E3/uL  Comprehensive metabolic panel  Result Value Ref Range   Glucose 75 65 - 99 mg/dL   BUN 10 6 - 20 mg/dL   Creatinine, Ser 0.86 0.57 - 1.00 mg/dL   GFR calc non Af Amer 93 >59 mL/min/1.73   GFR calc Af Amer 107 >59 mL/min/1.73   BUN/Creatinine  Ratio 12 9 - 23   Sodium 143 134 - 144 mmol/L   Potassium 4.0 3.5 - 5.2 mmol/L   Chloride 106 96 - 106 mmol/L   CO2 23 20 - 29 mmol/L   Calcium 9.6 8.7 - 10.2 mg/dL   Total Protein 7.1  6.0 - 8.5 g/dL   Albumin 4.5 3.5 - 5.5 g/dL   Globulin, Total 2.6 1.5 - 4.5 g/dL   Albumin/Globulin Ratio 1.7 1.2 - 2.2   Bilirubin Total 0.3 0.0 - 1.2 mg/dL   Alkaline Phosphatase 81 39 - 117 IU/L   AST 17 0 - 40 IU/L   ALT 12 0 - 32 IU/L  Lipid Panel w/o Chol/HDL Ratio  Result Value Ref Range   Cholesterol, Total 94 (L) 100 - 199 mg/dL   Triglycerides 61 0 - 149 mg/dL   HDL 43 >39 mg/dL   VLDL Cholesterol Cal 12 5 - 40 mg/dL   LDL Calculated 39 0 - 99 mg/dL  TSH  Result Value Ref Range   TSH 0.968 0.450 - 4.500 uIU/mL  UA/M w/rflx Culture, Routine  Result Value Ref Range   Specific Gravity, UA 1.015 1.005 - 1.030   pH, UA 8.5 (H) 5.0 - 7.5   Color, UA Yellow Yellow   Appearance Ur CD Clear   Leukocytes, UA URTRA Negative   Protein, UA Negative Negative/Trace   Glucose, UA Negative Negative   Ketones, UA Negative Negative   RBC, UA Negative Negative   Bilirubin, UA Negative Negative   Urobilinogen, Ur 1.0 0.2 - 1.0 mg/dL   Nitrite, UA Negative Negative   Microscopic Examination See below:   VITAMIN D 25 Hydroxy (Vit-D Deficiency, Fractures)  Result Value Ref Range   Vit D, 25-Hydroxy 17.0 (L) 30.0 - 100.0 ng/mL      Assessment & Plan:   Problem List Items Addressed This Visit    None    Visit Diagnoses    RLQ abdominal pain    -  Primary   Likely cyst. Will treat constipation. 2+ leuks, CBC normal- await cx. Getting [place]. Await results. Call with any concerns.    Relevant Orders   UA/M w/rflx Culture, Routine   CBC With Differential/Platelet   US Pelvic Complete With Transvaginal       Follow up plan: Return if symptoms worsen or fail to improve.

## 2018-09-01 ENCOUNTER — Encounter: Payer: Self-pay | Admitting: Family Medicine

## 2018-09-01 ENCOUNTER — Ambulatory Visit: Payer: Commercial Managed Care - PPO | Admitting: Family Medicine

## 2018-09-01 VITALS — BP 118/69 | HR 67 | Temp 98.3°F | Wt 162.4 lb

## 2018-09-01 DIAGNOSIS — R1031 Right lower quadrant pain: Secondary | ICD-10-CM | POA: Diagnosis not present

## 2018-09-01 MED ORDER — POLYETHYLENE GLYCOL 3350 17 GM/SCOOP PO POWD: 17.0000 g | Freq: Two times a day (BID) | ORAL | 1 refills | Status: DC | PRN

## 2018-09-02 ENCOUNTER — Encounter: Payer: Self-pay | Admitting: Family Medicine

## 2018-09-02 ENCOUNTER — Telehealth: Payer: Self-pay | Admitting: Family Medicine

## 2018-09-02 MED ORDER — CIPROFLOXACIN HCL 500 MG PO TABS: 500.0000 mg | ORAL_TABLET | Freq: Two times a day (BID) | ORAL | 0 refills | Status: DC

## 2018-09-02 NOTE — Telephone Encounter (Signed)
Copied from [place] [phone]. Topic: General - Inquiry >> Sep 02, 2018  2:33 PM Margot Ables wrote: Reason for CRM: pt called to f/u on results and has sent mychart msg stating she is feeling worse and requesting an antibiotic.  [id number] [address], [place] (Phone) [phone] (Fax)

## 2018-09-03 ENCOUNTER — Ambulatory Visit
Admission: RE | Admit: 2018-09-03 | Discharge: 2018-09-03 | Disposition: A | Discharge status: Home / Self Care | Payer: Commercial Managed Care - PPO | Source: Ambulatory Visit | Attending: Family Medicine | Admitting: Family Medicine

## 2018-09-03 DIAGNOSIS — R1031 Right lower quadrant pain: Secondary | ICD-10-CM | POA: Diagnosis present

## 2018-09-03 LAB — CBC WITH DIFFERENTIAL/PLATELET
HEMATOCRIT: 40.5 % (ref 34.0–46.6)
Hemoglobin: 13.2 g/dL (ref 11.1–15.9)
LYMPHS ABS: 2 10*3/uL (ref 0.7–3.1)
LYMPHS: 25 %
MCH: 30.1 pg (ref 26.6–33.0)
MCHC: 32.6 g/dL (ref 31.5–35.7)
MCV: 92 fL (ref 79–97)
MID (ABSOLUTE): 0.5 10*3/uL (ref 0.1–1.6)
MID: 7 %
Neutrophils Absolute: 5.4 10*3/uL (ref 1.4–7.0)
Neutrophils: 68 %
PLATELETS: 279 10*3/uL (ref 150–450)
RBC: 4.39 x10E6/uL (ref 3.77–5.28)
RDW: 12.6 % (ref 11.7–15.4)
WBC: 7.9 10*3/uL (ref 3.4–10.8)

## 2018-09-03 LAB — UA/M W/RFLX CULTURE, ROUTINE
BILIRUBIN UA: NEGATIVE
Glucose, UA: NEGATIVE
KETONES UA: NEGATIVE
Nitrite, UA: NEGATIVE
PROTEIN UA: NEGATIVE
RBC UA: NEGATIVE
SPEC GRAV UA: 1.015 (ref 1.005–1.030)
UUROB: 1 mg/dL (ref 0.2–1.0)
pH, UA: 7.5 (ref 5.0–7.5)

## 2018-09-03 LAB — URINE CULTURE, REFLEX: Organism ID, Bacteria: NO GROWTH

## 2018-09-03 LAB — MICROSCOPIC EXAMINATION: RBC, UA: NONE SEEN /hpf (ref 0–2)

## 2018-09-04 ENCOUNTER — Encounter: Payer: Self-pay | Admitting: Family Medicine

## 2018-09-04 ENCOUNTER — Encounter: Payer: Self-pay | Admitting: Obstetrics and Gynecology

## 2018-09-04 MED ORDER — TRAMADOL HCL 50 MG PO TABS: 50.0000 mg | ORAL_TABLET | Freq: Three times a day (TID) | ORAL | 0 refills | Status: DC | PRN

## 2018-09-16 ENCOUNTER — Encounter: Payer: Self-pay | Admitting: Family Medicine

## 2018-09-18 ENCOUNTER — Encounter: Payer: Self-pay | Admitting: Family Medicine

## 2018-12-15 ENCOUNTER — Encounter: Payer: Self-pay | Admitting: Family Medicine

## 2018-12-16 ENCOUNTER — Other Ambulatory Visit: Payer: Self-pay

## 2018-12-16 ENCOUNTER — Encounter: Payer: Self-pay | Admitting: Family Medicine

## 2018-12-16 ENCOUNTER — Ambulatory Visit (INDEPENDENT_AMBULATORY_CARE_PROVIDER_SITE_OTHER): Payer: Commercial Managed Care - PPO | Admitting: Family Medicine

## 2018-12-16 DIAGNOSIS — F411 Generalized anxiety disorder: Secondary | ICD-10-CM

## 2018-12-16 DIAGNOSIS — F4321 Adjustment disorder with depressed mood: Secondary | ICD-10-CM

## 2018-12-16 DIAGNOSIS — F4323 Adjustment disorder with mixed anxiety and depressed mood: Secondary | ICD-10-CM

## 2018-12-16 MED ORDER — LORAZEPAM 0.5 MG PO TABS: 0.5000 mg | ORAL_TABLET | Freq: Two times a day (BID) | ORAL | 1 refills | Status: DC | PRN

## 2018-12-16 NOTE — Telephone Encounter (Signed)
Seen today for virtual visit.  

## 2018-12-16 NOTE — Patient Instructions (Addendum)
Go to this link to see the counselors on their page:  [email or web address]   Anxiety Therapists in [place]   [employer] ([address] [place], [facility])    [employer]   Clinical Social Work/Therapist, LCSW, LCAS, RYT    Verified by Psychology Today      "We all have learned to walk through this world with a certain degree of guardedness and protection. Through a safe and gradual process, I assist people in letting down the walls that we formed to protect [place]. I have found that when our masks start to come off, we can form more authentic and real connections with others. You can safely begin to open, become vulnerable, and get in touch with what you desire. Together, we discover how you can live in a more authentic way in your life. Freedom and happiness are attainable in this world!"    Anxiety   [facility]    [phone]       Office is near: [place], [place] [employer]   Licensed Professional Counselor, [facility], [facility]    Verified by Psychology Today    "I help folks who are stuck and anxious. And, because life is hectic, I'm a leader in 'virtual visits', secure sessions from your own home. I have 20 years of counseling in depression, anxiety, grief, and more, but that's not the point. If you are reading this, you already know you're facing challenges. I help you get perspective on your life and build a tool kit to manage stress and make better decisions. I'm proactive, solutions-focused, and know that a sense of humor can often help [place] get the darkest parts of ourselves back into the light."    Anxiety   [employer]    [phone]    Ruthy Dick   Licensed Professional Counselor, LPC    Verified by Psychology Today        "Helping you to find hope in times of uncertainty. When anxiety, self-doubt, and negative thoughts feel like they  are your constant companions. Maybe you have felt stuck or overwhelmed by the day-to-day stress that life brings. If this sounds familiar, you are not alone! Counseling can be a catalyst for positive change in your life and relationships. I work with adults who are working through anxiety, depression, life transitions or self confidence issues. I also work with couples who wish to strengthen or improve their relationship, manage conflict more kindly, improve communication, or figure out what's next."    [place]    [phone]   [place], [place], [facility], [place], [place]    Verified by Psychology Today   "As far as most people know you have a great life, but deep down you know something is missing. You feel anxious, guilty, overwhelmed, resentful and exhausted. Despite your achievements, your life is not fulfilling. You don't feel "good enough." You feel empty. Your life has become an overscheduled calendar and endless to do list. This isn't the life you dreamed of but you're not sure where to start. It's time to make a change. You deserve to live a fulfilled and authentic life."    Anxiety   [employer]    [phone]   Rochel Brome Stogner   Clinical Social Work/Therapist, LCSW    Verified by Psychology Today        "In the midst of difficulty most of [place] start by asking the "why"  questions. However it is the "what" questions that lead to change. I view counseling is a supportive journey with a trained and experienced counselor to help you find direction and to make the changes that are needed. I provide strategies that enable progress and new beginnings. Within a trusting professional relationship I work to help you build a stronger sense of identify and worth while achieving your counseling goals. If you want your Darrick Meigs faith interwoven into your therapy this can be done with sensitivity and a consideration of  personal needs."    Anxiety   [facility]    [phone]   Fabiola Backer   Licensed Professional Counselor, LPCS    Verified by Psychology Today    "I have worked as a Licensed conveyancer for the past twenty five years in a variety of settings. My experience includes counseling persons who have experienced anxiety, depression and trauma. Over my career, I have been fortunate to have acquired many skills and experiences which I refer to as my "tool box". I believe that no single theory or approach has the "answers" to effectively treat and help you with the problems that you hope to address in counseling. My belief is that you have the answers to your problems, and we can work together to find healthy solutions. I also provide supervision services for LPCA's."    Anxiety   [facility]    [phone]   [place] Work/Therapist, LCSW    Verified by Psychology Today    "Part of being human means facing stressful and frightening situations. Most of the time we can tolerate and cope with these. Other times our problems become unmanageable. Situations, problems, relationships, work, school and other life experiences produce thoughts, feelings and unhelpful behaviors that become so overwhelming we begin to believe that we will "never" find a way to get through it. If your feelings and thoughts seem too big for you to manage at this time then I would be honored to join you and assist you to find new strategies and information that will help you cope more effectively now and in the future."    Anxiety   [facility]    [phone]    [facility], [facility]   Verified by Psychology Today      "[facility] is a Biomedical engineer serving the counseling needs of adults and adolescents in the [place] area. Our therapists are masters level-trained clinicians with a  combined experience spanning over 30 years in the mental health field. Our mission is to inspire hope and healing in those who struggle with depression, anxiety, and life challenges. We are here to walk with you through your pain, stress and struggles."    Anxiety   [facility]    [phone]   Vertell Limber   M.Ed, [facility], [place]    Verified by Psychology Today    "[place] is a Primary school teacher located near downtown [place]. I offer a warm, safe, and inviting atmosphere where you can share your concerns and we can work towards positive changes. I welcome all ethnicities, sexual identities and and spiritual orientations. I am qualified to work with individuals and couples experiencing a number of concerns including mood disorders, sexual abuse, anxiety disorders, adjustment issues, relationship and family concerns and social issues to name a few. "    Anxiety   [facility]    [phone]   Sherian Maroon  Counselor, LPC, DBS, NLP-P, MA    Verified by Psychology Today       "Has the stress of life left you feeling overwhelmed? Do you have difficulty coping with day to day activities due to depression, anxiety, grief, or another emotional issue? Finding someone who can walk with you in this difficult time is an important first step towards feeling better. I can offer you or your child a safe place, filled with unconditional acceptance to share the difficulties that life has placed in your path. My passion is helping you move from depression, anxiety, grief, or low self- esteem into a person equipped to live your best life possible."    [place]    [phone]  [place], MEd, [place], [facility]    Verified by Psychology Today   "I strive to provide a private, informal and comfortable office setting where my clients can expect to be treated with respect  and dignity while facing life's challenges. I work with children, adolescents, adults, families, and couples. Some of my interests include: Self esteem, career/work, aging, stress and anger management, women's issues, eating disorders, addiction, conduct disorders, obsessive compulsive disorders, life coaching, assertiveness training, parenting and relationship skills, marital, pre-marital or divorce issues, depression, anxiety, grief/loss, dependency; emotional, sexual and physical abuse, trauma and ADHD."    Anxiety   [facility]    [phone]  [place] Work/Therapist, MSW, LCSW    Verified by Psychology Today  "Are you feeling anxious, depressed, worried or looking to increase your happiness? Life events can make everyday activities seem overwhelming and beyond reach. I have experience working with adults utilizing a variety of techniques to address anxiety, grief, depression, and stress and other behavioral and emotional disorders. I welcome the opportunity to work collaboratively with you to achieve all of your life goals. In our sessions I provide a supportive compassionate environment and I will work with you to bring about positive life changes. "    Anxiety   [facility]    [phone]   Marlene Lard   Licensed Professional Counselor, MS, [facility]    Verified by Psychology Today   "I believe the counselor/client dynamic is a relationship built on mutual understanding and respect. Through this exchange I seek to provide an environment where I will assist you in obtaining realistic forms of emotional, spiritual, and mental well being. My practice focuses on capitalizing on your strengths as well as empowering you to live your best life. 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 "    [place]    [phone]   [facility]   Licensed Professional Counselor, BS, [facility], LPC    Verified by Psychology Today       "Are you ready to give yourself permission to be the 'Adult' you have always wanted to be? Permission to enjoy your life! Permission to be content! Walk in Faith! Taking Life on Life's Terms. I work with individuals/couples to find new ways to open up to a life of peace/joy. Women who can't get past their relationships with their mothers, children who are overwhelmed and families who just need help/direction. Experience with Group work on [employer], Coping  Skills, Mindfulness & Codependency. Depression and Anxiety can improve with increased self awareness."    Anxiety   [facility]    [phone]   Izola Price. [place] Work/Therapist, MA, MSW, LCSW, CEAP, DAPA    Verified by Psychology Today       "I am an active problem solver who employs both EMDR and cognitive/behavioral techniques to offer clients concrete tools to address challenges. My special interests include enhancing relationship communication in both professional and personal circumstances. Additionally, I can assist with conflict resolution, and empowerment of individuals to achieve their full potential, both professionally and personally."    Anxiety   [facility]    [phone]   [place], MS, LMFT    Verified by Psychology Today   "Di Kindle is an experienced, trained clinician who works hard as a professional to commit to initiating change in the lives' of individuals and families by strengthening individual and family health. Di Kindle owns and operates Education administrator, [employer], and has Forensic scientist at her office ready to help!"    Anxiety    [facility]    [phone]   [place]   Counselor, [place], [employer]    Verified by Psychology Today  "Imagine life is like a journey completely in your control but you feel stuck. Choices you make dictate life's obstacles and they can be tricky. Life's journey comes with no instruction pamphlet or ready-made companions. A path unfolds and you decide what to do and who to trust along the way. As you journey you begin to see things you want, potential companions, places you want to explore, and tools you can use to make travels simpler. There are so many choices to make with no time to stop and contemplate the best decision; you have to keep going by making choices every day, about everything. Decisions and deadlines do not wait."    Anxiety   [facility]    [phone]   [facility]   Clinical Social Work/Therapist, LCSW    Verified by Psychology Today   "Are you currently dealing with more than you can handle? Do you need a little extra support? Do you need an outlet to help you process all of the stress? "    Anxiety   [facility]    [phone]   [place], LPC, [place], CCS, MAC    Verified by Psychology Today   "[employer] focuses on assisting individuals suffering from mental health disorders, substance use disorders and other issues that is creating dissonance in all parts of their mind, body and spirit. [employer] focuses on helping individuals through providing educational opportunities, enriching activities and enlightening information to gain the wisdom, knowledge and instruction needed to bring all parts of their lives into balance."    Anxiety   [facility]    [phone]   [place] Teacher, early years/pre, [facility]    Verified by Psychology Today   "We are currently in very  stressful times. In our work together, you will be preparing to navigate through difficult challenges. We will assure your resilience. Resilience allows [place] to accept the difficulty of the situation and bolsters our belief that we can grow into the people we need to be to meet the challenge. Whether you're a young adult headed for autonomy, a parent supporting a young person's growth,  a couple embarking on a life together or needing to remember "why" you're a couple, or an individual ready to "rethink, re-orient, and re-evaluate", this is your opportunity to prepare for the journey. "    Anxiety   [facility]    [phone]

## 2018-12-16 NOTE — Progress Notes (Signed)
There were no vitals taken for this visit.   Subjective:    Patient ID: Gabrielle Jackson, female    DOB: 03-Feb-1991, 28 y.o.   MRN: [id number]  HPI: Gabrielle Jackson is a 28 y.o. female  Chief Complaint  Patient presents with  . Anxiety   ANXIETY/STRESS- has been stressed. Her grandmother died and she is having marital issues and her mother has been sick. Not doing well. Would like to see a counselor and a psychiatrist. Feels like she's about to break down.  Duration:exacerbated Anxious mood: yes  Excessive worrying: yes Irritability: yes  Sweating: yes Nausea: yes Palpitations:yes Hyperventilation: no Panic attacks: no Agoraphobia: no  Obscessions/compulsions: no Depressed mood: yes Depression screen [facility] 2/9 12/16/2018 07/09/2018 06/11/2018 01/29/2018 07/22/2017  Decreased Interest 1 0 2 0 1  Down, Depressed, Hopeless 3 0 1 0 1  PHQ - 2 Score 4 0 3 0 2  Altered sleeping 3 0 2 0 3  Tired, decreased energy 3 3 3 1 3   Change in appetite 3 0 3 0 3  Feeling bad or failure about yourself  3 0 1 0 1  Trouble concentrating 3 0 2 0 1  Moving slowly or fidgety/restless 0 0 0 0 0  Suicidal thoughts 0 0 0 0 0  PHQ-9 Score 19 3 14 1 13   Difficult doing work/chores Very difficult Not difficult at all Somewhat difficult Not difficult at all -   GAD 7 : Generalized Anxiety Score 12/16/2018 07/09/2018 06/11/2018 01/29/2018  Nervous, Anxious, on Edge 3 0 3 0  Control/stop worrying 3 1 2  0  Worry too much - different things 3 1 3  0  Trouble relaxing 3 0 2 0  Restless 3 0 1 0  Easily annoyed or irritable 0 1 3 0  Afraid - awful might happen 3 0 3 0  Total GAD 7 Score 18 3 17  0  Anxiety Difficulty Somewhat difficult Somewhat difficult Somewhat difficult Not difficult at all   Anhedonia: no Weight changes: no Insomnia: yes hard to fall asleep  Hypersomnia: no Fatigue/loss of energy: yes Feelings of worthlessness: yes Feelings of guilt: yes Impaired concentration/indecisiveness: yes  Suicidal ideations: no  Crying spells: yes Recent Stressors/Life Changes: yes   Relationship problems: yes   Family stress: yes     Financial stress: yes    Job stress: no    Recent death/loss: yes   Relevant past medical, surgical, family and social history reviewed and updated as indicated. Interim medical history since our last visit reviewed. Allergies and medications reviewed and updated.  Review of Systems  Constitutional: Positive for fatigue. Negative for activity change, appetite change, chills, diaphoresis, fever and unexpected weight change.  Respiratory: Negative.   Musculoskeletal: Negative.   Skin: Negative.   Neurological: Negative.   Psychiatric/Behavioral: Positive for dysphoric mood and sleep disturbance. Negative for agitation, behavioral problems, confusion, decreased concentration, hallucinations, self-injury and suicidal ideas. The patient is nervous/anxious. The patient is not hyperactive.     Per HPI unless specifically indicated above     Objective:    There were no vitals taken for this visit.  Wt Readings from Last 3 Encounters:  09/01/18 162 lb 6.4 oz (73.7 kg)  07/09/18 160 lb 1 oz (72.6 kg)  06/11/18 159 lb 6 oz (72.3 kg)    Physical Exam Vitals signs and nursing note reviewed.  Constitutional:      General: She is not in acute distress.    Appearance: Normal appearance. She  is not ill-appearing, toxic-appearing or diaphoretic.  HENT:     Head: Normocephalic and atraumatic.     Right Ear: External ear normal.     Left Ear: External ear normal.     Nose: Nose normal.     Mouth/Throat:     Mouth: Mucous membranes are moist.     Pharynx: Oropharynx is clear.  Eyes:     General: No scleral icterus.       Right eye: No discharge.        Left eye: No discharge.     Conjunctiva/sclera: Conjunctivae normal.     Pupils: Pupils are equal, round, and reactive to light.  Neck:     Musculoskeletal: Normal range of motion.  Pulmonary:      Effort: Pulmonary effort is normal. No respiratory distress.     Comments: Speaking in full sentences Musculoskeletal: Normal range of motion.  Skin:    Coloration: Skin is not jaundiced or pale.     Findings: No bruising, erythema, lesion or rash.  Neurological:     Mental Status: She is alert and oriented to person, place, and time. Mental status is at baseline.  Psychiatric:        Mood and Affect: Mood normal.        Behavior: Behavior normal.        Thought Content: Thought content normal.        Judgment: Judgment normal.     Results for orders placed or performed in visit on 09/01/18  Microscopic Examination   URINE  Result Value Ref Range   WBC, UA 6-10 (A) 0 - 5 /hpf   RBC, UA None seen 0 - 2 /hpf   Epithelial Cells (non renal) 0-10 0 - 10 /hpf   Bacteria, UA Few (A) None seen/Few  Urine Culture, Reflex   URINE  Result Value Ref Range   Urine Culture, Routine Final report    Organism ID, Bacteria No growth   UA/M w/rflx Culture, Routine   Specimen: Urine   URINE  Result Value Ref Range   Specific Gravity, UA 1.015 1.005 - 1.030   pH, UA 7.5 5.0 - 7.5   Color, UA Yellow Yellow   Appearance Ur Cloudy (A) Clear   Leukocytes, UA 2+ (A) Negative   Protein, UA Negative Negative/Trace   Glucose, UA Negative Negative   Ketones, UA Negative Negative   RBC, UA Negative Negative   Bilirubin, UA Negative Negative   Urobilinogen, Ur 1.0 0.2 - 1.0 mg/dL   Nitrite, UA Negative Negative   Microscopic Examination See below:    Urinalysis Reflex Comment   CBC With Differential/Platelet  Result Value Ref Range   WBC 7.9 3.4 - 10.8 x10E3/uL   RBC 4.39 3.77 - 5.28 x10E6/uL   Hemoglobin 13.2 11.1 - 15.9 g/dL   Hematocrit 40.5 34.0 - 46.6 %   MCV 92 79 - 97 fL   MCH 30.1 26.6 - 33.0 pg   MCHC 32.6 31.5 - 35.7 g/dL   RDW 12.6 11.7 - 15.4 %   Platelets 279 150 - 450 x10E3/uL   Neutrophils 68 Not Estab. %   Lymphs 25 Not Estab. %   MID 7 Not Estab. %   Neutrophils Absolute  5.4 1.4 - 7.0 x10E3/uL   Lymphocytes Absolute 2.0 0.7 - 3.1 x10E3/uL   MID (Absolute) 0.5 0.1 - 1.6 X10E3/uL      Assessment & Plan:   Problem List Items Addressed This Visit  Other   Anxiety disorder - Primary    Does not want to increase her effexor right now. Will see psychiatry and counselor. Will start short course of lorazepam for extreme anxiety. Recheck 1 month. Call with any concerns. Referral to psych and list of counselors from psychology today given today. Call with any concerns. Continue to monitor.       Relevant Medications   LORazepam (ATIVAN) 0.5 MG tablet   Other Relevant Orders   Ambulatory referral to Psychiatry    Other Visit Diagnoses    Grief       See discussion under GAD   Relevant Orders   Ambulatory referral to Psychiatry   Adjustment disorder with mixed anxiety and depressed mood       See discussion under GAD   Relevant Orders   Ambulatory referral to Psychiatry       Follow up plan: Return in about 4 weeks (around 01/13/2019) for If hasn't seen psych.   . This visit was completed via [employer] due to the restrictions of the COVID-19 pandemic. All issues as above were discussed and addressed. Physical exam was done as above through visual confirmation on Doximity. If it was felt that the patient should be evaluated in the office, they were directed there. The patient verbally consented to this visit. . Location of the patient: home . Location of the provider: work . Those involved with this call:  . Provider: Park Liter, DO . CMA: Gerda Diss, CMA . Front Desk/Registration: Don Perking  . Time spent on call: 15 minutes with patient face to face via video conference. More than 50% of this time was spent in counseling and coordination of care. 23 minutes total spent in review of patient's record and preparation of their chart.

## 2018-12-16 NOTE — Assessment & Plan Note (Signed)
Does not want to increase her effexor right now. Will see psychiatry and counselor. Will start short course of lorazepam for extreme anxiety. Recheck 1 month. Call with any concerns. Referral to psych and list of counselors from psychology today given today. Call with any concerns. Continue to monitor.

## 2018-12-18 ENCOUNTER — Encounter: Payer: Self-pay | Admitting: Family Medicine

## 2018-12-29 NOTE — Progress Notes (Deleted)
PCP:  Valerie Roys, DO   No chief complaint on file.    HPI:      [name] is a 28 y.o. [id number] who LMP was No LMP recorded., presents today for her annual examination.  Her menses are regular every 28-30 days, lasting 5-7 days.  Dysmenorrhea mild, occurring first 1-2 days of flow. She does not have intermenstrual bleeding. Has noticed sharp, intermittent RLQ pains since her last menses. Has taken tylenol without relief. Hx of ovar cysts in past.  Neg YGN [place] 5/19  Sex activity: single partner, contraception - rhythm method. Pt declines BC. Last Pap: 10/23/17  Results were: no abnormalities  Hx of STDs: none  There is no FH of breast cancer. There is no FH of ovarian cancer. The patient does do self-breast exams.  Tobacco use: The patient denies current or previous tobacco use. Alcohol use: none No drug use.  Exercise: not active  She does get adequate calcium and Vitamin D in her diet.   Past Medical History:  Diagnosis Date  . Breast mass 05/02/2010   lt breast pain  . Car sickness   . Dysmenorrhea   . Ovarian cyst   . Velamentous insertion of umbilical cord     Past Surgical History:  Procedure Laterality Date  . TYMPANOPLASTY Right 03/21/2018   Procedure: TYMPANOPLASTY WITH GRAFT;  Surgeon: Beverly Gust, MD;  Location: [facility];  Service: ENT;  Laterality: Right;    Family History  Problem Relation Age of Onset  . Hypertension Father   . COPD Maternal Grandfather   . COPD Paternal Grandmother     Social History   Socioeconomic History  . Marital status: Married    Spouse name: Not on file  . Number of children: Not on file  . Years of education: Not on file  . Highest education level: Not on file  Occupational History  . Not on file  Social Needs  . Financial resource strain: Not on file  . Food insecurity    Worry: Not on file    Inability: Not on file  . Transportation needs    Medical: Not on file   Non-medical: Not on file  Tobacco Use  . Smoking status: Never Smoker  . Smokeless tobacco: Never Used  Substance and Sexual Activity  . Alcohol use: Yes    Alcohol/week: 0.0 standard drinks    Comment: On occasion  . Drug use: No  . Sexual activity: Yes    Birth control/protection: None  Lifestyle  . Physical activity    Days per week: Not on file    Minutes per session: Not on file  . Stress: Not on file  Relationships  . Social Herbalist on phone: Not on file    Gets together: Not on file    Attends religious service: Not on file    Active member of club or organization: Not on file    Attends meetings of clubs or organizations: Not on file    Relationship status: Not on file  . Intimate partner violence    Fear of current or ex partner: Not on file    Emotionally abused: Not on file    Physically abused: Not on file    Forced sexual activity: Not on file  Other Topics Concern  . Not on file  Social History Narrative  . Not on file    Outpatient Medications Prior to Visit  Medication Sig Dispense  Refill  . LORazepam (ATIVAN) 0.5 MG tablet Take 1 tablet (0.5 mg total) by mouth 2 (two) times daily as needed for anxiety. 30 tablet 1  . venlafaxine XR (EFFEXOR-XR) 75 MG 24 hr capsule Take 1 capsule (75 mg total) by mouth daily with breakfast. 90 capsule 1   No facility-administered medications prior to visit.     ROS:  Review of Systems  Constitutional: Negative for fatigue, fever and unexpected weight change.  Respiratory: Negative for cough, shortness of breath and wheezing.   Cardiovascular: Negative for chest pain, palpitations and leg swelling.  Gastrointestinal: Negative for blood in stool, constipation, diarrhea, nausea and vomiting.  Endocrine: Negative for cold intolerance, heat intolerance and polyuria.  Genitourinary: Positive for pelvic pain. Negative for dyspareunia, dysuria, flank pain, frequency, genital sores, hematuria, menstrual problem,  urgency, vaginal bleeding, vaginal discharge and vaginal pain.  Musculoskeletal: Negative for back pain, joint swelling and myalgias.  Skin: Negative for rash.  Neurological: Negative for dizziness, syncope, light-headedness, numbness and headaches.  Hematological: Negative for adenopathy.  Psychiatric/Behavioral: Negative for agitation, confusion, sleep disturbance and suicidal ideas. The patient is not nervous/anxious.    BREAST: No symptoms   Objective: There were no vitals taken for this visit.   Physical Exam Constitutional:      Appearance: She is well-developed.  Genitourinary:     Vagina and uterus normal.     No vaginal discharge, erythema or tenderness.     No cervical motion tenderness or polyp.     Uterus is not enlarged or tender.     No right or left adnexal mass present.     Right adnexa tender.     Left adnexa not tender.  Neck:     Musculoskeletal: Normal range of motion.     Thyroid: No thyromegaly.  Cardiovascular:     Rate and Rhythm: Normal rate and regular rhythm.     Heart sounds: Normal heart sounds. No murmur.  Pulmonary:     Effort: Pulmonary effort is normal.     Breath sounds: Normal breath sounds.  Chest:     Breasts:        Right: No mass, nipple discharge, skin change or tenderness.        Left: No mass, nipple discharge, skin change or tenderness.  Abdominal:     Palpations: Abdomen is soft. Abdomen is not rigid.     Tenderness: There is no abdominal tenderness. There is no guarding.  Musculoskeletal: Normal range of motion.  Neurological:     Mental Status: She is alert and oriented to person, place, and time.     Cranial Nerves: No cranial nerve deficit.  Psychiatric:        Behavior: Behavior normal.  Vitals signs reviewed.    Assessment/Plan: No diagnosis found.        GYN counsel adequate intake of calcium and vitamin D, diet and exercise     F/U  No follow-ups on file./ 1 yr annual  [place] B. Anabell Swint, PA-C 12/29/2018  3:37 PM

## 2018-12-30 ENCOUNTER — Ambulatory Visit: Payer: Commercial Managed Care - PPO | Admitting: Obstetrics and Gynecology

## 2019-01-07 ENCOUNTER — Other Ambulatory Visit: Payer: Self-pay

## 2019-01-07 NOTE — Telephone Encounter (Signed)
Erroneous Encounter

## 2019-01-08 ENCOUNTER — Ambulatory Visit (HOSPITAL_COMMUNITY): Payer: Self-pay | Admitting: Licensed Clinical Social Worker

## 2019-01-13 ENCOUNTER — Other Ambulatory Visit: Payer: Self-pay

## 2019-01-13 ENCOUNTER — Encounter: Payer: Self-pay | Admitting: Family Medicine

## 2019-01-13 ENCOUNTER — Ambulatory Visit (INDEPENDENT_AMBULATORY_CARE_PROVIDER_SITE_OTHER): Payer: Commercial Managed Care - PPO | Admitting: Family Medicine

## 2019-01-13 DIAGNOSIS — J0101 Acute recurrent maxillary sinusitis: Secondary | ICD-10-CM | POA: Diagnosis not present

## 2019-01-13 MED ORDER — LEVOFLOXACIN 500 MG PO TABS: 500.0000 mg | ORAL_TABLET | Freq: Every day | ORAL | 0 refills | Status: DC

## 2019-01-13 MED ORDER — PREDNISONE 10 MG PO TABS: ORAL_TABLET | ORAL | 0 refills | Status: DC

## 2019-01-13 NOTE — Progress Notes (Signed)
There were no vitals taken for this visit.   Subjective:    Patient ID: Gabrielle Jackson, female    DOB: 05-01-1991, 28 y.o.   MRN: [id number]  HPI: Gabrielle Jackson is a 28 y.o. female  Chief Complaint  Patient presents with   Cough    sore throat, post nasal drainage, has done an antibiotic and prednisone through urgent care, went away while on meds, came back worse after completion of meds.    UPPER RESPIRATORY TRACT INFECTION Duration: 2-3 weeks Worst symptom: congestion and cough Fever: no Cough: yes Shortness of breath: no Wheezing: no Chest pain: no Chest tightness: no Chest congestion: no Nasal congestion: yes Runny nose: yes Post nasal drip: yes Sneezing: yes Sore throat: yes Swollen glands: yes Sinus pressure: yes Headache: yes Face pain: yes Toothache: yes Ear pain: yes "right Ear pressure: yes "right Eyes red/itching:no Eye drainage/crusting: no  Vomiting: no Rash: no Fatigue: yes Sick contacts: no Strep contacts: no  Context: worse Recurrent sinusitis: no Relief with OTC cold/cough medications: no  Treatments attempted: prednisone, cold/sinus, mucinex, anti-histamine, pseudoephedrine and antibiotics   Relevant past medical, surgical, family and social history reviewed and updated as indicated. Interim medical history since our last visit reviewed. Allergies and medications reviewed and updated.  Review of Systems  Constitutional: Positive for chills, diaphoresis and fatigue. Negative for activity change, appetite change, fever and unexpected weight change.  HENT: Positive for congestion, ear pain, postnasal drip, rhinorrhea, sinus pressure, sinus pain, sneezing and sore throat. Negative for dental problem, drooling, ear discharge, facial swelling, hearing loss, mouth sores, nosebleeds, tinnitus, trouble swallowing and voice change.   Eyes: Negative.   Respiratory: Positive for cough. Negative for apnea, choking, chest tightness, shortness of  breath, wheezing and stridor.   Cardiovascular: Negative.   Gastrointestinal: Negative.   Musculoskeletal: Negative.   Psychiatric/Behavioral: Negative.     Per HPI unless specifically indicated above     Objective:    There were no vitals taken for this visit.  Wt Readings from Last 3 Encounters:  09/01/18 162 lb 6.4 oz (73.7 kg)  07/09/18 160 lb 1 oz (72.6 kg)  06/11/18 159 lb 6 oz (72.3 kg)    Physical Exam Vitals signs and nursing note reviewed.  Constitutional:      General: She is not in acute distress.    Appearance: Normal appearance. She is normal weight. She is not ill-appearing, toxic-appearing or diaphoretic.  HENT:     Head: Normocephalic and atraumatic.     Right Ear: External ear normal.     Left Ear: External ear normal.     Nose: Congestion and rhinorrhea present.     Mouth/Throat:     Mouth: Mucous membranes are moist.     Pharynx: Oropharynx is clear. No oropharyngeal exudate or posterior oropharyngeal erythema.  Eyes:     General: No scleral icterus.       Right eye: No discharge.        Left eye: No discharge.     Extraocular Movements: Extraocular movements intact.     Conjunctiva/sclera: Conjunctivae normal.     Pupils: Pupils are equal, round, and reactive to light.  Neck:     Musculoskeletal: Normal range of motion.  Pulmonary:     Effort: Pulmonary effort is normal. No respiratory distress.     Comments: Speaking in full sentences Musculoskeletal: Normal range of motion.  Skin:    Coloration: Skin is not jaundiced or pale.  Findings: No bruising, erythema, lesion or rash.  Neurological:     Mental Status: She is alert and oriented to person, place, and time. Mental status is at baseline.  Psychiatric:        Mood and Affect: Mood normal.        Behavior: Behavior normal.        Thought Content: Thought content normal.        Judgment: Judgment normal.     Results for orders placed or performed in visit on 09/01/18  Microscopic  Examination   URINE  Result Value Ref Range   WBC, UA 6-10 (A) 0 - 5 /hpf   RBC, UA None seen 0 - 2 /hpf   Epithelial Cells (non renal) 0-10 0 - 10 /hpf   Bacteria, UA Few (A) None seen/Few  Urine Culture, Reflex   URINE  Result Value Ref Range   Urine Culture, Routine Final report    Organism ID, Bacteria No growth   UA/M w/rflx Culture, Routine   Specimen: Urine   URINE  Result Value Ref Range   Specific Gravity, UA 1.015 1.005 - 1.030   pH, UA 7.5 5.0 - 7.5   Color, UA Yellow Yellow   Appearance Ur Cloudy (A) Clear   Leukocytes, UA 2+ (A) Negative   Protein, UA Negative Negative/Trace   Glucose, UA Negative Negative   Ketones, UA Negative Negative   RBC, UA Negative Negative   Bilirubin, UA Negative Negative   Urobilinogen, Ur 1.0 0.2 - 1.0 mg/dL   Nitrite, UA Negative Negative   Microscopic Examination See below:    Urinalysis Reflex Comment   CBC With Differential/Platelet  Result Value Ref Range   WBC 7.9 3.4 - 10.8 x10E3/uL   RBC 4.39 3.77 - 5.28 x10E6/uL   Hemoglobin 13.2 11.1 - 15.9 g/dL   Hematocrit 40.5 34.0 - 46.6 %   MCV 92 79 - 97 fL   MCH 30.1 26.6 - 33.0 pg   MCHC 32.6 31.5 - 35.7 g/dL   RDW 12.6 11.7 - 15.4 %   Platelets 279 150 - 450 x10E3/uL   Neutrophils 68 Not Estab. %   Lymphs 25 Not Estab. %   MID 7 Not Estab. %   Neutrophils Absolute 5.4 1.4 - 7.0 x10E3/uL   Lymphocytes Absolute 2.0 0.7 - 3.1 x10E3/uL   MID (Absolute) 0.5 0.1 - 1.6 X10E3/uL      Assessment & Plan:   Problem List Items Addressed This Visit    None    Visit Diagnoses    Acute recurrent maxillary sinusitis    -  Primary   Not better. Will treat with levaquin and 12 day taper of prednisone. Call if not getting better or getting worse.   Relevant Medications   levofloxacin (LEVAQUIN) 500 MG tablet   predniSONE (DELTASONE) 10 MG tablet       Follow up plan: Return if symptoms worsen or fail to improve.

## 2019-01-14 ENCOUNTER — Telehealth (HOSPITAL_COMMUNITY): Payer: Self-pay | Admitting: Licensed Clinical Social Worker

## 2019-01-14 ENCOUNTER — Ambulatory Visit (INDEPENDENT_AMBULATORY_CARE_PROVIDER_SITE_OTHER): Payer: Commercial Managed Care - PPO | Admitting: Licensed Clinical Social Worker

## 2019-01-14 DIAGNOSIS — F4321 Adjustment disorder with depressed mood: Secondary | ICD-10-CM | POA: Diagnosis not present

## 2019-01-14 NOTE — Progress Notes (Signed)
Comprehensive Clinical Assessment (CCA) Note  01/14/2019 Gabrielle Jackson [id number]   Virtual Visit via Telephone Note  I connected with Gabrielle Jackson on 01/14/19 at  1:00 PM EDT by telephone and verified that I am speaking with the correct person using two identifiers.  I discussed the limitations, risks, security and privacy concerns of performing an evaluation and management service by telephone and the availability of in person appointments. I also discussed with the patient that there may be a patient responsible charge related to this service. The patient expressed understanding and agreed to proceed.  I discussed the assessment and treatment plan with the patient. The patient was provided an opportunity to ask questions and all were answered. The patient agreed with the plan and demonstrated an understanding of the instructions.   The patient was advised to call back or seek an in-person evaluation if the symptoms worsen or if the condition fails to improve as anticipated.    Visit Diagnosis:      ICD-10-CM   1. Adjustment disorder with depressed mood  F43.21       CCA Part One  Part One has been completed on paper by the patient.  (See scanned document in Chart Review)  CCA Part Two A  Intake/Chief Complaint:  CCA Intake With Chief Complaint CCA Part Two Date: 01/14/19 CCA Part Two Time: 0100 Chief Complaint/Presenting Problem: shutting down and isolating Individual's Strengths: moitivated for the positive, grateful Initial Clinical Notes/Concerns: grandmother passed away, mother hospitalized with leukemia, problems in marriage  Patient Report: Patient denies any history of depression/anxiety and any known family history of mental illness or substance abuse. She reports that the mood problems she is experiencing began after her grandmother, to whom she was very close, died. Shortly after, her mother was diagnosed with Leukemia. Patient states that she and her  mother were not speaking until the diagnosis, as mother disapproves of patient's husband and has made mean/angry remarks about him. Patient's husband is a recovering addict, and she states that he has an addictive personality such that when he is not using he is often gambling, overspending, or engaging in some other harmful activity. Patient reports that they are almost out of bankruptcy from husband's addictions 5 years ago. She states that there is a lot of stress and strain in the relationship because of these behaviors. Patient has 2 young children and works from home at this time.   Mental Health Symptoms Depression:  Depression: Change in energy/activity, Fatigue, Increase/decrease in appetite, Sleep (too much or little), Tearfulness, Weight gain/loss, Difficulty Concentrating, Worthlessness  Mania:  Mania: N/A  Anxiety:   Anxiety: Worrying  Psychosis:  Psychosis: N/A  Trauma:     Obsessions:  Obsessions: N/A  Compulsions:  Compulsions: N/A  Inattention:  Inattention: N/A  Hyperactivity/Impulsivity:  Hyperactivity/Impulsivity: N/A  Oppositional/Defiant Behaviors:  Oppositional/Defiant Behaviors: N/A  Borderline Personality:  Emotional Irregularity: N/A  Other Mood/Personality Symptoms:      Mental Status Exam Appearance and self-care  Stature:    Unable to assess in telephone session  Weight:    Unable to assess in telephone session  Clothing:    Unable to assess in telephone session  Grooming:    Unable to assess in telephone session  Cosmetic use:    Unable to assess in telephone session  Posture/gait:    Unable to assess in telephone session  Motor activity:    Unable to assess in telephone session  Sensorium  Attention:   Normal  Concentration:  Unable to assess in telephone session  Orientation:   x5  Recall/memory:   Normal  Affect and Mood  Affect:    Unable to assess in telephone session  Mood:   Depressed  Relating  Eye contact:    Unable to assess in telephone  session  Facial expression:    Unable to assess in telephone session  Attitude toward examiner:   Cooperative  Thought and Language  Speech flow:  Normal  Thought content:   Normal  Preoccupation:   N/a  Hallucinations:   N/a  Organization:   N/a  Transport planner of Knowledge:   Average  Intelligence:   Average  Abstraction:   Normal  Judgement:   Fair  Building surveyor  Insight:   Fair  Decision Making:   Fair  Social Functioning  Social Maturity:  Social Maturity: Isolates  Social Judgement:  Social Judgement: Normal  Stress  Stressors:  Stressors: Family conflict, Grief/losses  Coping Ability:  Coping Ability: Research officer, political party Deficits:   Coping Strategies  Supports:   Father   Family and Psychosocial History: Family history Marital status: Married Number of Years Married: 6 What types of issues is patient dealing with in the relationship?: he's a recovering drug addict, he's put her through hell and back these past 6 years, he overspent to the point of banckruptcy, dishonesty/distrust Are you sexually active?: Yes What is your sexual orientation?: heterosexual Does patient have children?: Yes How many children?: 2 How is patient's relationship with their children?: ages 22 and 4 "they're my life"  Childhood History:  Childhood History By whom was/is the patient raised?: Both parents Additional childhood history information: parents still married though they argued a lot as she got older, mom was pretty overbearing, and this caused problems in their relationship from age 18 on, parents don't like husband and didn't want pt with him, estranged from mom until mom got sick Description of patient's relationship with caregiver when they were a child: good, but tumultuous with mom as a teenager Patient's description of current relationship with people who raised him/her: recently reunited and now they are close, good with dad How were you disciplined when  you got in trouble as a child/adolescent?: grounded Does patient have siblings?: Yes Number of Siblings: 1 Description of patient's current relationship with siblings: brother 8 years younger, okay relationship but not close Did patient suffer any verbal/emotional/physical/sexual abuse as a child?: No Did patient suffer from severe childhood neglect?: No Has patient ever been sexually abused/assaulted/raped as an adolescent or adult?: No Was the patient ever a victim of a crime or a disaster?: No Witnessed domestic violence?: No Has patient been effected by domestic violence as an adult?: No  CCA Part Two B  Employment/Work Situation: Employment / Work Copywriter, advertising Employment situation: Employed Where is patient currently employed?: [employer] (working from home) How long has patient been employed?: 3.5 years Patient's job has been impacted by current illness: No Did You Receive Any Psychiatric Treatment/Services While in the [employer]?: No Are There Guns or Other Weapons in [place]?: No  Education: Education Did Teacher, adult education From [employer]?: Yes Did Physicist, medical?: No Did Heritage manager?: No Did You Have An Individualized Education Program (IIEP): No Did You Have Any Difficulty At [employer]?: No  Religion: Religion/Spirituality Are You A Religious Person?: Yes What is Your Religious Affiliation?: Baptist How Might This Affect Treatment?: it won't  Leisure/Recreation: Leisure / Recreation Leisure  and Hobbies: going to get massages, playing with her kids  Exercise/Diet: Exercise/Diet Do You Exercise?: No Have You Gained or Lost A Significant Amount of Weight in the Past Six Months?: Yes-Gained Number of Pounds Gained: 10 Do You Follow a Special Diet?: No Do You Have Any Trouble Sleeping?: Yes Explanation of Sleeping Difficulties: trouble falling asleep and then wakes up 3-4 times per night  CCA Part Two C  Alcohol/Drug Use: Alcohol / Drug  Use History of alcohol / drug use?: No history of alcohol / drug abuse  CCA Part Three  ASAM's:  Six Dimensions of Multidimensional Assessment  Dimension 1:  Acute Intoxication and/or Withdrawal Potential:     Dimension 2:  Biomedical Conditions and Complications:     Dimension 3:  Emotional, Behavioral, or Cognitive Conditions and Complications:     Dimension 4:  Readiness to Change:     Dimension 5:  Relapse, Continued use, or Continued Problem Potential:     Dimension 6:  Recovery/Living Environment:       Social Function:  Social Functioning Social Maturity: Isolates Social Judgement: Normal  Stress:  Stress Stressors: Family conflict, Grief/losses Coping Ability: Exhausted Patient Takes Medications The Way The Doctor Instructed?: Yes Priority Risk: Low Acuity  Risk Assessment- Self-Harm Potential: Risk Assessment For Self-Harm Potential Thoughts of Self-Harm: No current thoughts Method: No plan Availability of Means: No access/NA  Risk Assessment -Dangerous to Others Potential: Risk Assessment For Dangerous to Others Potential Method: No Plan Availability of Means: No access or NA Intent: Vague intent or NA Notification Required: No need or identified person  DSM5 Diagnoses: Patient Active Problem List   Diagnosis Date Noted  . Anxiety disorder 09/05/2015  . Partial hamstring tear 05/02/2015    Patient Centered Plan: Patient is on the following Treatment Plan(s):  Adjustment   Recommendations for Services/Supports/Treatments: Recommendations for Services/Supports/Treatments Recommendations For Services/Supports/Treatments: Individual Therapy  Treatment Plan Summary: OP Treatment Plan Summary: Demonstrate healthy ability to adjust to current life circumstances   I provided 54 minutes of non face-to-face services in this encounter. Lillie Fragmin, LCSW

## 2019-01-23 ENCOUNTER — Ambulatory Visit: Payer: Self-pay | Admitting: Family Medicine

## 2019-01-27 ENCOUNTER — Ambulatory Visit (HOSPITAL_COMMUNITY): Payer: Commercial Managed Care - PPO | Admitting: Licensed Clinical Social Worker

## 2019-02-05 ENCOUNTER — Other Ambulatory Visit: Payer: Self-pay | Admitting: Family Medicine

## 2019-02-05 NOTE — Telephone Encounter (Signed)
Forwarding medication refill request to PCP for review. 

## 2019-02-13 ENCOUNTER — Other Ambulatory Visit: Payer: Self-pay

## 2019-02-13 ENCOUNTER — Encounter: Payer: Self-pay | Admitting: Psychiatry

## 2019-02-13 ENCOUNTER — Ambulatory Visit (INDEPENDENT_AMBULATORY_CARE_PROVIDER_SITE_OTHER): Payer: Commercial Managed Care - PPO | Admitting: Psychiatry

## 2019-02-13 DIAGNOSIS — F329 Major depressive disorder, single episode, unspecified: Secondary | ICD-10-CM | POA: Diagnosis not present

## 2019-02-13 DIAGNOSIS — F411 Generalized anxiety disorder: Secondary | ICD-10-CM | POA: Diagnosis not present

## 2019-02-13 DIAGNOSIS — F32A Depression, unspecified: Secondary | ICD-10-CM | POA: Insufficient documentation

## 2019-02-13 MED ORDER — VENLAFAXINE HCL ER 75 MG PO CP24: 150.0000 mg | ORAL_CAPSULE | Freq: Every day | ORAL | 1 refills | Status: DC

## 2019-02-13 NOTE — Progress Notes (Signed)
Virtual Visit via Video Note  I connected with Gabrielle Jackson on 02/13/19 at  9:00 AM EDT by a video enabled telemedicine application and verified that I am speaking with the correct person using two identifiers.   I discussed the limitations of evaluation and management by telemedicine and the availability of in person appointments. The patient expressed understanding and agreed to proceed.   I discussed the assessment and treatment plan with the patient. The patient was provided an opportunity to ask questions and all were answered. The patient agreed with the plan and demonstrated an understanding of the instructions.   The patient was advised to call back or seek an in-person evaluation if the symptoms worsen or if the condition fails to improve as anticipated.    Psychiatric Initial Adult Assessment   Patient Identification: Gabrielle Jackson MRN:  [id number] Date of Evaluation:  02/13/2019 Referral Source: Dr. Park Liter Chief Complaint:   Chief Complaint    Establish Care     Visit Diagnosis:    ICD-10-CM   1. GAD (generalized anxiety disorder)  F41.1 venlafaxine XR (EFFEXOR-XR) 75 MG 24 hr capsule  2. Depressive disorder  F32.9 venlafaxine XR (EFFEXOR-XR) 75 MG 24 hr capsule   unspecified    History of Present Illness:  Gabrielle Jackson is a 28 year old Caucasian female, married, employed, lives in [place], has a history of anxiety and depressive symptoms, diagnosed with adjustment disorder, was evaluated by telemedicine today.  Patient was recently seen by a therapist Ms. Regina Alexander-dated 01/14/2019.  I have reviewed notes per Ms. Alexander.  Patient reports she has been struggling with depression and anxiety symptoms since the past 2 years.  She reports sadness, anhedonia, lack of motivation, increased appetite, problems with concentration as well as sleep problems.  She reports this has been worsening since the past couple of months.  She reports she currently takes  venlafaxine.  She was on a lower dose for several years.  She reports her dosage was increased 2 months ago to 75 mg.  She reports it helps to some extent.  Patient reports anxiety symptoms.  She is worried often, worries about different things, have trouble relaxing, often get irritable due to her anxiety and is fidgety or restless.  She reports this has been getting worse since the past few months.  She has been struggling with anxiety symptoms since the past 3 to 5 years.  She reports the Effexor does help to some extent.  She also reports panic attacks.  She reports soon after the birth of her second child who is currently 2-1/2 years she started having panic symptoms.  She describes panic attacks as racing heart rate, shortness of breath and feeling of impending doom.  She reports she had a very severe one the first time.  She however has currently learned to manage her symptoms better and does not have them very frequently.  She was prescribed lorazepam as needed in the past however she did not like the effect and does not take it much.  Patient denies any other problems at this time.  She denies any suicidality, homicidality or perceptual disturbances.  Patient denies any history of trauma.  Patient denies any manic or hypomanic episodes.  Patient does report psychosocial stressors.  She reports she was very close to her grandmother , however she passed away 2 months ago.  Patient reports she has difficulty coping with it.  Her mother has been diagnosed with leukemia.  This has been going on since  November 2019.  Patient reports that is also a stressor for her.  Patient also has problems in her relationship.  Her mother did not approve of her relationship with her husband.  She reports hence she and her mother did not talk for 5 years.  However after she got diagnosed with leukemia they were able to get back together.  Patient reports her husband has an addictive personality and often gambles,  overspends money as well as has a history of substance abuse.  Patient reports this is a stressor for her.  She is trying to get him some help.  Patient has 2 young children at home.  She is employed.  She has good support system from her family.  Associated Signs/Symptoms: Depression Symptoms:  depressed mood, insomnia, fatigue, difficulty concentrating, increased appetite, (Hypo) Manic Symptoms:  denies Anxiety Symptoms:  Excessive Worry, Psychotic Symptoms:  denies PTSD Symptoms: Negative  Past Psychiatric History: Patient reports a history of adjustment disorder diagnosis.  She was being managed by her primary care provider.  Denies inpatient mental health admissions.  Denies suicide attempts.  Previous Psychotropic Medications: Yes Effexor, lorazepam, BuSpar-did not take it  Substance Abuse History in the last 12 months:  No.  Consequences of Substance Abuse: Negative  Past Medical History:  Past Medical History:  Diagnosis Date  . Breast mass 05/02/2010   lt breast pain  . Car sickness   . Dysmenorrhea   . Ovarian cyst   . Velamentous insertion of umbilical cord     Past Surgical History:  Procedure Laterality Date  . TYMPANOPLASTY Right 03/21/2018   Procedure: TYMPANOPLASTY WITH GRAFT;  Surgeon: Beverly Gust, MD;  Location: [facility];  Service: ENT;  Laterality: Right;    Family Psychiatric History: Mother-depression, maternal grandmother-depression  Family History:  Family History  Problem Relation Age of Onset  . Depression Mother   . Hypertension Father   . Depression Maternal Grandmother   . COPD Maternal Grandfather   . COPD Paternal Grandmother     Social History:   Social History   Socioeconomic History  . Marital status: Married    Spouse name: Not on file  . Number of children: 2  . Years of education: Not on file  . Highest education level: Not on file  Occupational History  . Not on file  Social Needs  . Financial  resource strain: Not on file  . Food insecurity    Worry: Not on file    Inability: Not on file  . Transportation needs    Medical: Not on file    Non-medical: Not on file  Tobacco Use  . Smoking status: Never Smoker  . Smokeless tobacco: Never Used  Substance and Sexual Activity  . Alcohol use: Yes    Alcohol/week: 0.0 standard drinks    Comment: On occasion  . Drug use: No  . Sexual activity: Yes    Birth control/protection: None  Lifestyle  . Physical activity    Days per week: Not on file    Minutes per session: Not on file  . Stress: Not on file  Relationships  . Social Herbalist on phone: Not on file    Gets together: Not on file    Attends religious service: Not on file    Active member of club or organization: Not on file    Attends meetings of clubs or organizations: Not on file    Relationship status: Not on file  Other Topics Concern  .  Not on file  Social History Narrative  . Not on file    Additional Social History: Patient is married.  She lives in [place] with her husband and 2 children aged 82 and 24-1/2 years old.  She is a Programmer, systems.  She works as a Psychologist, sport and exercise at [employer].  She is currently starting school again to get an associate degree at [employer].  Patient was raised by both parents-had a good childhood.  She has been married since the past 5 years.  She has 1 brother.  Allergies:   Allergies  Allergen Reactions  . Penicillins Rash    Metabolic Disorder Labs: No results found for: HGBA1C, MPG No results found for: PROLACTIN Lab Results  Component Value Date   CHOL 94 (L) 06/11/2018   TRIG 61 06/11/2018   HDL 43 06/11/2018   LDLCALC 39 06/11/2018   Lab Results  Component Value Date   TSH 0.968 06/11/2018    Therapeutic Level Labs: No results found for: LITHIUM No results found for: CBMZ No results found for: VALPROATE  Current Medications: Current Outpatient Medications  Medication Sig Dispense Refill  .  levofloxacin (LEVAQUIN) 500 MG tablet Take 1 tablet (500 mg total) by mouth daily. 7 tablet 0  . LORazepam (ATIVAN) 0.5 MG tablet Take 1 tablet (0.5 mg total) by mouth 2 (two) times daily as needed for anxiety. 30 tablet 1  . predniSONE (DELTASONE) 10 MG tablet 6 tabs today and tomorrow, 5 tabs the next 2 days, decrease by 1 every other day until gone. 42 tablet 0  . venlafaxine XR (EFFEXOR-XR) 75 MG 24 hr capsule Take 2 capsules (150 mg total) by mouth daily with breakfast. 60 capsule 1   No current facility-administered medications for this visit.     Musculoskeletal: Strength & Muscle Tone: UTA Gait & Station: WNL Patient leans: N/A  Psychiatric Specialty Exam: Review of Systems  Psychiatric/Behavioral: Positive for depression. The patient is nervous/anxious and has insomnia.   All other systems reviewed and are negative.   not currently breastfeeding.There is no height or weight on file to calculate BMI.  General Appearance: Casual  Eye Contact:  Fair  Speech:  Clear and Coherent  Volume:  Normal  Mood:  Anxious and Depressed  Affect:  Congruent  Thought Process:  Goal Directed and Descriptions of Associations: Intact  Orientation:  Full (Time, Place, and Person)  Thought Content:  Logical  Suicidal Thoughts:  No  Homicidal Thoughts:  No  Memory:  Immediate;   Fair Recent;   Fair Remote;   Fair  Judgement:  Fair  Insight:  Fair  Psychomotor Activity:  Normal  Concentration:  Concentration: Fair and Attention Span: Fair  Recall:  [employer] of Knowledge:Fair  Language: Fair  Akathisia:  No  Handed:  Right  AIMS (if indicated): Denies tremors, rigidity  Assets:  Communication Skills Desire for Improvement  ADL's:  Intact  Cognition: WNL  Sleep:  Poor   Screenings: GAD-7     Office Visit from 02/13/2019 in [facility] Office Visit from 12/16/2018 in [facility] Visit from 07/09/2018 in [facility]  Visit from 06/11/2018 in [facility] Visit from 01/29/2018 in [facility]  Total GAD-7 Score  18  18  3  17   0    PHQ2-9     Office Visit from 02/13/2019 in [facility] Office Visit from 12/16/2018 in [facility] Visit from  07/09/2018 in [facility] Visit from 06/11/2018 in [facility] Visit from 01/29/2018 in [facility]  PHQ-2 Total Score  3  4  0  3  0  PHQ-9 Total Score  13  19  3  14  1       Assessment and Plan: Katielynn is a 28 year old Caucasian female, married, employed, lives in [place], has a history of adjustment disorder, was evaluated by telemedicine today.  Patient is biologically predisposed given her family history of mental health problems.  She has psychosocial stressors of her mother's health issues, husband's mental health problems.  Patient currently denies suicidality or substance abuse problems and is motivated to be in therapy as well as continue medications.  Plan as noted below.  Plan Depressive disorder unspecified-unstable Rule out MDD PHQ-9 equals 13 Continue Effexor. Continue CBT with therapist here in clinic  For GAD- unstable GAD 7 equals 18 Increase venlafaxine to 150 mg p.o. daily Continue CBT  I have reviewed labs in [facility] dated 06/11/2018-within normal limits  Follow-up in clinic in 4 weeks or sooner if needed.  September 11 at 10:30 AM  I have spent atleast 40 minutes non face to face with patient today. More than 50 % of the time was spent for psychoeducation and supportive psychotherapy and care coordination.  This note was generated in part or whole with voice recognition software. Voice recognition is usually quite accurate but there are transcription errors that can and very often do occur. I apologize for any typographical errors that were not detected and corrected.        Ursula Alert, MD 8/14/202012:25  PM

## 2019-02-13 NOTE — Progress Notes (Signed)
Called patient to do pre-screening but she did not pick up so I left a message to tell her to be sure she picks up the phone at her appointment time. There is only one number listed in snapshot for her.

## 2019-03-03 ENCOUNTER — Encounter: Payer: Self-pay | Admitting: Family Medicine

## 2019-03-03 DIAGNOSIS — T148XXA Other injury of unspecified body region, initial encounter: Secondary | ICD-10-CM

## 2019-03-13 ENCOUNTER — Ambulatory Visit (INDEPENDENT_AMBULATORY_CARE_PROVIDER_SITE_OTHER): Payer: Commercial Managed Care - PPO | Admitting: Psychiatry

## 2019-03-13 ENCOUNTER — Other Ambulatory Visit: Payer: Self-pay

## 2019-03-13 ENCOUNTER — Encounter: Payer: Self-pay | Admitting: Psychiatry

## 2019-03-13 DIAGNOSIS — Z634 Disappearance and death of family member: Secondary | ICD-10-CM

## 2019-03-13 DIAGNOSIS — F411 Generalized anxiety disorder: Secondary | ICD-10-CM

## 2019-03-13 DIAGNOSIS — F32A Depression, unspecified: Secondary | ICD-10-CM

## 2019-03-13 DIAGNOSIS — F329 Major depressive disorder, single episode, unspecified: Secondary | ICD-10-CM | POA: Diagnosis not present

## 2019-03-13 MED ORDER — VENLAFAXINE HCL ER 75 MG PO CP24: 75.0000 mg | ORAL_CAPSULE | Freq: Every day | ORAL | 1 refills | Status: DC

## 2019-03-13 MED ORDER — MIRTAZAPINE 15 MG PO TABS: 7.5000 mg | ORAL_TABLET | Freq: Every day | ORAL | 1 refills | Status: DC

## 2019-03-13 MED ORDER — LORAZEPAM 0.5 MG PO TABS: 0.5000 mg | ORAL_TABLET | ORAL | 1 refills | Status: DC

## 2019-03-13 NOTE — Progress Notes (Signed)
Virtual Visit via Video Note  I connected with Gabrielle Jackson on 03/13/19 at 10:30 AM EDT by a video enabled telemedicine application and verified that I am speaking with the correct person using two identifiers.   I discussed the limitations of evaluation and management by telemedicine and the availability of in person appointments. The patient expressed understanding and agreed to proceed.   I discussed the assessment and treatment plan with the patient. The patient was provided an opportunity to ask questions and all were answered. The patient agreed with the plan and demonstrated an understanding of the instructions.   The patient was advised to call back or seek an in-person evaluation if the symptoms worsen or if the condition fails to improve as anticipated.   [facility] MD OP Progress Note  03/13/2019 12:52 PM MAAME WINSLETT  MRN:  [id number]  Chief Complaint:  Chief Complaint    Follow-up     HPI: Gabrielle Jackson is a 28 year old Caucasian female, married, employed, lives in [place], has a history of anxiety, depression, was evaluated by telemedicine today.  Patient today reports she lost her mother 2 weeks ago.  She became very tearful when she discussed it.  She reports she has has been struggling with significant anxiety, crying spells as well as sleep problems recently.  Patient reports she reaches out to her father who has been supportive.  She however reports she continues to have relationship struggles with her husband.  Patient currently denies any suicidality, homicidality or perceptual disturbances.  She has reduced her venlafaxine back to a 75 mg daily.  She reports the higher dosage gave her side effects and she did not like the way it made her feel.  Discussed starting mirtazapine for sleep as well as anxiety and depression.  She agrees with plan.  Provided medication education.  Also discussed reaching out to her therapist.  Discussed with her that she can be referred to  Ms. Miguel Dibble in the community so that she can start CBT as well as grief counseling.  She agrees with plan. Visit Diagnosis:    ICD-10-CM   1. GAD (generalized anxiety disorder)  F41.1 venlafaxine XR (EFFEXOR-XR) 75 MG 24 hr capsule    LORazepam (ATIVAN) 0.5 MG tablet    mirtazapine (REMERON) 15 MG tablet  2. Depressive disorder  F32.9 venlafaxine XR (EFFEXOR-XR) 75 MG 24 hr capsule    mirtazapine (REMERON) 15 MG tablet   unspecified  3. Bereavement  Z63.4     Past Psychiatric History: I have reviewed past psychiatric history from my progress note on 02/13/2019.  Past trials of Effexor, lorazepam, BuSpar  Past Medical History:  Past Medical History:  Diagnosis Date  . Breast mass 05/02/2010   lt breast pain  . Car sickness   . Dysmenorrhea   . Ovarian cyst   . Velamentous insertion of umbilical cord     Past Surgical History:  Procedure Laterality Date  . TYMPANOPLASTY Right 03/21/2018   Procedure: TYMPANOPLASTY WITH GRAFT;  Surgeon: Beverly Gust, MD;  Location: [facility];  Service: ENT;  Laterality: Right;    Family Psychiatric History: I have reviewed family psychiatric history from my progress note on 02/13/2019.  Family History:  Family History  Problem Relation Age of Onset  . Depression Mother   . Hypertension Father   . Depression Maternal Grandmother   . COPD Maternal Grandfather   . COPD Paternal Grandmother     Social History: I have reviewed social history from my progress  note on 02/13/2019 Social History   Socioeconomic History  . Marital status: Married    Spouse name: Not on file  . Number of children: 2  . Years of education: Not on file  . Highest education level: Not on file  Occupational History  . Not on file  Social Needs  . Financial resource strain: Not on file  . Food insecurity    Worry: Not on file    Inability: Not on file  . Transportation needs    Medical: Not on file    Non-medical: Not on file  Tobacco Use  .  Smoking status: Never Smoker  . Smokeless tobacco: Never Used  Substance and Sexual Activity  . Alcohol use: Yes    Alcohol/week: 0.0 standard drinks    Comment: On occasion  . Drug use: No  . Sexual activity: Yes    Birth control/protection: None  Lifestyle  . Physical activity    Days per week: Not on file    Minutes per session: Not on file  . Stress: Not on file  Relationships  . Social Herbalist on phone: Not on file    Gets together: Not on file    Attends religious service: Not on file    Active member of club or organization: Not on file    Attends meetings of clubs or organizations: Not on file    Relationship status: Not on file  Other Topics Concern  . Not on file  Social History Narrative  . Not on file    Allergies:  Allergies  Allergen Reactions  . Penicillins Rash    Metabolic Disorder Labs: No results found for: HGBA1C, MPG No results found for: PROLACTIN Lab Results  Component Value Date   CHOL 94 (L) 06/11/2018   TRIG 61 06/11/2018   HDL 43 06/11/2018   LDLCALC 39 06/11/2018   Lab Results  Component Value Date   TSH 0.968 06/11/2018   TSH 0.995 03/01/2017    Therapeutic Level Labs: No results found for: LITHIUM No results found for: VALPROATE No components found for:  CBMZ  Current Medications: Current Outpatient Medications  Medication Sig Dispense Refill  . levofloxacin (LEVAQUIN) 500 MG tablet Take 1 tablet (500 mg total) by mouth daily. 7 tablet 0  . LORazepam (ATIVAN) 0.5 MG tablet Take 1 tablet (0.5 mg total) by mouth as directed. To be taken once a day , up to 3 times a week for severe panic attacks 15 tablet 1  . mirtazapine (REMERON) 15 MG tablet Take 0.5-1 tablets (7.5-15 mg total) by mouth at bedtime. Sleep and mood 30 tablet 1  . predniSONE (DELTASONE) 10 MG tablet 6 tabs today and tomorrow, 5 tabs the next 2 days, decrease by 1 every other day until gone. 42 tablet 0  . venlafaxine XR (EFFEXOR-XR) 75 MG 24 hr  capsule Take 1 capsule (75 mg total) by mouth daily with breakfast. 30 capsule 1   No current facility-administered medications for this visit.      Musculoskeletal: Strength & Muscle Tone: UTA Gait & Station: normal Patient leans: N/A  Psychiatric Specialty Exam: Review of Systems  Psychiatric/Behavioral: Positive for depression. The patient is nervous/anxious and has insomnia.   All other systems reviewed and are negative.   There were no vitals taken for this visit.There is no height or weight on file to calculate BMI.  General Appearance: Casual  Eye Contact:  Fair  Speech:  Clear and Coherent  Volume:  Normal  Mood:  Anxious and Depressed  Affect:  Tearful  Thought Process:  Goal Directed and Descriptions of Associations: Intact  Orientation:  Full (Time, Place, and Person)  Thought Content: Logical   Suicidal Thoughts:  No  Homicidal Thoughts:  No  Memory:  Immediate;   Fair Recent;   Fair Remote;   Fair  Judgement:  Fair  Insight:  Fair  Psychomotor Activity:  Normal  Concentration:  Concentration: Fair and Attention Span: Fair  Recall:  [employer] of Knowledge: Fair  Language: Fair  Akathisia:  No  Handed:  Right  AIMS (if indicated): Denies tremors, rigidity  Assets:  Communication Skills Desire for Improvement Social Support  ADL's:  Intact  Cognition: WNL  Sleep:  Poor   Screenings: GAD-7     Office Visit from 02/13/2019 in [facility] Visit from 12/16/2018 in [facility] Visit from 07/09/2018 in [facility] Visit from 06/11/2018 in [facility] Visit from 01/29/2018 in [facility]  Total GAD-7 Score  18  18  3  17   0    PHQ2-9     Office Visit from 02/13/2019 in [facility] Office Visit from 12/16/2018 in [facility] Visit from 07/09/2018 in [facility] Visit from 06/11/2018 in [facility] Visit from 01/29/2018 in [facility]  PHQ-2 Total Score  3  4  0  3  0  PHQ-9 Total Score  13  19  3  14  1        Assessment and Plan: Normalee is a 28 year old Caucasian female, married, employed, lives in [place], has a history of anxiety disorder, depression was evaluated by telemedicine today.  She is biologically predisposed given her family history of mental health problems.  She also has psychosocial stressors of her mother's recent death, grandmother's death 2 months ago, husband's mental health problems.  Patient currently denies suicidality or substance abuse problem however is currently struggling with grief.  She will benefit from medication readjustment as well as psychotherapy sessions.  Plan Depressive disorder unspecified-unstable Continue Effexor at low dosage of 75 mg p.o. daily Start mirtazapine 7.5 mg for sleep. We will refer her to psychotherapy sessions-refer her to Ms. Miguel Dibble.  GAD-unstable Effexor as prescribed Start mirtazapine 7.5 to 15 mg p.o. nightly   For bereavement-unstable We will refer her for psychotherapy sessions Continue lorazepam 0.5 mg take up to 3 times a week for severe panic attacks only.  Follow-up in clinic in 2 to 3 weeks or sooner if needed.  September 30 at 2:15 PM  I have spent atleast 15 minutes non  face to face with patient today. More than 50 % of the time was spent for psychoeducation and supportive psychotherapy and care coordination. This note was generated in part or whole with voice recognition software. Voice recognition is usually quite accurate but there are transcription errors that can and very often do occur. I apologize for any typographical errors that were not detected and corrected.       Ursula Alert, MD 03/13/2019, 12:52 PM

## 2019-03-17 ENCOUNTER — Encounter: Payer: Self-pay | Admitting: Family Medicine

## 2019-04-01 ENCOUNTER — Other Ambulatory Visit: Payer: Self-pay

## 2019-04-01 ENCOUNTER — Encounter: Payer: Self-pay | Admitting: Psychiatry

## 2019-04-01 ENCOUNTER — Ambulatory Visit (INDEPENDENT_AMBULATORY_CARE_PROVIDER_SITE_OTHER): Payer: Commercial Managed Care - PPO | Admitting: Psychiatry

## 2019-04-01 DIAGNOSIS — F4010 Social phobia, unspecified: Secondary | ICD-10-CM

## 2019-04-01 DIAGNOSIS — Z634 Disappearance and death of family member: Secondary | ICD-10-CM

## 2019-04-01 DIAGNOSIS — F320 Major depressive disorder, single episode, mild: Secondary | ICD-10-CM

## 2019-04-01 DIAGNOSIS — F411 Generalized anxiety disorder: Secondary | ICD-10-CM | POA: Diagnosis not present

## 2019-04-01 NOTE — Progress Notes (Signed)
Virtual Visit via Video Note  I connected with Gabrielle Jackson on 04/01/19 at  2:15 PM EDT by a video enabled telemedicine application and verified that I am speaking with the correct person using two identifiers.   I discussed the limitations of evaluation and management by telemedicine and the availability of in person appointments. The patient expressed understanding and agreed to proceed.  I discussed the assessment and treatment plan with the patient. The patient was provided an opportunity to ask questions and all were answered. The patient agreed with the plan and demonstrated an understanding of the instructions.   The patient was advised to call back or seek an in-person evaluation if the symptoms worsen or if the condition fails to improve as anticipated.   Stafford Courthouse MD OP Progress Note  04/01/2019 2:37 PM Gabrielle Jackson  MRN:  [id number]  Chief Complaint:  Chief Complaint    Follow-up     HPI: Gabrielle Jackson is a 28 year old Caucasian female, married, employed, lives in [place], has a history of anxiety, depression was evaluated by telemedicine today.  Patient today reports she is currently making some progress with regards to her grief.  She reports she has been able to cope with it better.  She denies any significant depressive symptoms at this time.  She reports sleep is good.  She had stopped taking the mirtazapine.  She reports she used the Ativan couple of times since her last visit with Probation officer.  She has been limiting use.  Patient does report that she has started psychotherapy sessions with Ms. Miguel Dibble.  She reports she has started working on her grief as well as possible social anxiety symptoms.  Patient denies any suicidality, homicidality or perceptual disturbances.  She continues to have relationship struggles with her husband.  Patient denies any other concerns today. Visit Diagnosis:    ICD-10-CM   1. GAD (generalized anxiety disorder)  F41.1   2. 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 Social anxiety disorder  F40.10   4. Bereavement  Z63.4     Past Psychiatric History: I have reviewed past psychiatric history from my progress note on 02/13/2019.  Past trials of Effexor, lorazepam, BuSpar  Past Medical History:  Past Medical History:  Diagnosis Date  . Breast mass 05/02/2010   lt breast pain  . Car sickness   . Dysmenorrhea   . Ovarian cyst   . Velamentous insertion of umbilical cord     Past Surgical History:  Procedure Laterality Date  . TYMPANOPLASTY Right 03/21/2018   Procedure: TYMPANOPLASTY WITH GRAFT;  Surgeon: Beverly Gust, MD;  Location: [facility];  Service: ENT;  Laterality: Right;    Family Psychiatric History: I have reviewed family psychiatric history from my progress note on 02/13/2019  Family History:  Family History  Problem Relation Age of Onset  . Depression Mother   . Hypertension Father   . Depression Maternal Grandmother   . COPD Maternal Grandfather   . COPD Paternal Grandmother     Social History: I have reviewed social history from my progress note on 02/13/2019 Social History   Socioeconomic History  . Marital status: Married    Spouse name: Not on file  . Number of children: 2  . Years of education: Not on file  . Highest education level: Not on file  Occupational History  . Not on file  Social Needs  . Financial resource strain: Not on file  . Food insecurity  Worry: Not on file    Inability: Not on file  . Transportation needs    Medical: Not on file    Non-medical: Not on file  Tobacco Use  . Smoking status: Never Smoker  . Smokeless tobacco: Never Used  Substance and Sexual Activity  . Alcohol use: Yes    Alcohol/week: 0.0 standard drinks    Comment: On occasion  . Drug use: No  . Sexual activity: Yes    Birth control/protection: None  Lifestyle  . Physical activity    Days per week: Not on file    Minutes per session: Not  on file  . Stress: Not on file  Relationships  . Social Herbalist on phone: Not on file    Gets together: Not on file    Attends religious service: Not on file    Active member of club or organization: Not on file    Attends meetings of clubs or organizations: Not on file    Relationship status: Not on file  Other Topics Concern  . Not on file  Social History Narrative  . Not on file    Allergies:  Allergies  Allergen Reactions  . Penicillins Rash    Metabolic Disorder Labs: No results found for: HGBA1C, MPG No results found for: PROLACTIN Lab Results  Component Value Date   CHOL 94 (L) 06/11/2018   TRIG 61 06/11/2018   HDL 43 06/11/2018   LDLCALC 39 06/11/2018   Lab Results  Component Value Date   TSH 0.968 06/11/2018   TSH 0.995 03/01/2017    Therapeutic Level Labs: No results found for: LITHIUM No results found for: VALPROATE No components found for:  CBMZ  Current Medications: Current Outpatient Medications  Medication Sig Dispense Refill  . LORazepam (ATIVAN) 0.5 MG tablet Take 1 tablet (0.5 mg total) by mouth as directed. To be taken once a day , up to 3 times a week for severe panic attacks 15 tablet 1  . venlafaxine XR (EFFEXOR-XR) 75 MG 24 hr capsule Take 1 capsule (75 mg total) by mouth daily with breakfast. 30 capsule 1  . levofloxacin (LEVAQUIN) 500 MG tablet Take 1 tablet (500 mg total) by mouth daily. 7 tablet 0  . predniSONE (DELTASONE) 10 MG tablet 6 tabs today and tomorrow, 5 tabs the next 2 days, decrease by 1 every other day until gone. 42 tablet 0   No current facility-administered medications for this visit.      Musculoskeletal: Strength & Muscle Tone: UTA Gait & Station: normal Patient leans: N/A  Psychiatric Specialty Exam: Review of Systems  Psychiatric/Behavioral: The patient is nervous/anxious.   All other systems reviewed and are negative.   There were no vitals taken for this visit.There is no height or weight on  file to calculate BMI.  General Appearance: Casual  Eye Contact:  Fair  Speech:  Clear and Coherent  Volume:  Normal  Mood:  Anxious improving  Affect:  Appropriate  Thought Process:  Goal Directed and Descriptions of Associations: Intact  Orientation:  Full (Time, Place, and Person)  Thought Content: Logical   Suicidal Thoughts:  No  Homicidal Thoughts:  No  Memory:  Immediate;   Poor Recent;   Fair Remote;   Fair  Judgement:  Fair  Insight:  Fair  Psychomotor Activity:  Normal  Concentration:  Concentration: Fair and Attention Span: Fair  Recall:  [employer] of Knowledge: Fair  Language: Fair  Akathisia:  No  Handed:  Right  AIMS (if indicated): denies tremors, rigidity  Assets:  Communication Skills Desire for Improvement Housing Social Support  ADL's:  Intact  Cognition: WNL  Sleep:  Fair   Screenings: GAD-7     Office Visit from 02/13/2019 in [facility] Office Visit from 12/16/2018 in [facility] Visit from 07/09/2018 in [facility] Visit from 06/11/2018 in [facility] Visit from 01/29/2018 in [facility]  Total GAD-7 Score  18  18  3  17   0    PHQ2-9     Office Visit from 02/13/2019 in [facility] Office Visit from 12/16/2018 in [facility] Visit from 07/09/2018 in [facility] Visit from 06/11/2018 in [facility] Visit from 01/29/2018 in [facility]  PHQ-2 Total Score  3  4  0  3  0  PHQ-9 Total Score  13  19  3  14  1        Assessment and Plan: Shalva is a 28 year old Caucasian female, married, employed, lives in [place], has a history of anxiety disorder, depression was evaluated by telemedicine today.  Patient is biologically predisposed given her family history of mental health problems.  She also has psychosocial stressors of her mother's recent death, random  grandmother's death 2 months ago, husband's mental health problems.  Patient currently is compliant on medications as well as has started psychotherapy sessions.  Plan Depressive disorder -improving Continue Effexor at low dosage of 75 mg p.o. daily Discontinue mirtazapine for noncompliance Patient has started psychotherapy sessions with Ms. Miguel Dibble.  GAD- some progress Effexor as prescribed Continue CBT  Social anxiety disorder-unstable She will continue to work with her therapist  For bereavement- some progress Lorazepam 0.5 mg-up to 3 times a week for severe panic attacks only. Continue psychotherapy sessions  Follow-up in clinic in 6 weeks or sooner if needed.  November 10 at 4 PM  I have spent atleast 15 minutes non face to face with patient today. More than 50 % of the time was spent for psychoeducation and supportive psychotherapy and care coordination. This note was generated in part or whole with voice recognition software. Voice recognition is usually quite accurate but there are transcription errors that can and very often do occur. I apologize for any typographical errors that were not detected and corrected.       Ursula Alert, MD 04/01/2019, 2:37 PM

## 2019-04-02 ENCOUNTER — Telehealth: Payer: Self-pay

## 2019-04-02 ENCOUNTER — Ambulatory Visit (INDEPENDENT_AMBULATORY_CARE_PROVIDER_SITE_OTHER): Payer: Commercial Managed Care - PPO | Admitting: Obstetrics and Gynecology

## 2019-04-02 ENCOUNTER — Encounter: Payer: Self-pay | Admitting: Obstetrics and Gynecology

## 2019-04-02 VITALS — BP 110/80 | Ht 67.0 in | Wt 165.0 lb

## 2019-04-02 DIAGNOSIS — Z01419 Encounter for gynecological examination (general) (routine) without abnormal findings: Secondary | ICD-10-CM

## 2019-04-02 DIAGNOSIS — Z23 Encounter for immunization: Secondary | ICD-10-CM

## 2019-04-02 DIAGNOSIS — N644 Mastodynia: Secondary | ICD-10-CM

## 2019-04-02 NOTE — Patient Instructions (Signed)
I value your feedback and entrusting us with your care. If you get a [facility] patient survey, I would appreciate you taking the time to let us know about your experience today. Thank you! 

## 2019-04-02 NOTE — Telephone Encounter (Signed)
Faxed received on 04-02-19 states pt had 1st appt on  03-20-19 , 04-01-19 and a f/u appt  04-17-19 Referral #  [id number]

## 2019-04-02 NOTE — Progress Notes (Signed)
PCP:  Valerie Roys, DO   Chief Complaint  Patient presents with  . Gynecologic Exam    tenderness on left breast only, no lumps/knots noticed x couple months, flu shot today     HPI:      [name] is a 28 y.o. [id number] who LMP was Patient's last menstrual period was 03/19/2019 (approximate)., presents today for her annual examination.  Her menses are regular every 28-30 days, lasting 5-7 days.  Dysmenorrhea mild, occurring first 1-2 days of flow. She does not have intermenstrual bleeding. Hx of ovar cysts in past, last one 3/20. No recent sx.   Sex activity: single partner, contraception - rhythm method. Pt declines BC. Last Pap: 10/23/17  Results were: no abnormalities  Hx of STDs: none  There is no FH of breast cancer. There is no FH of ovarian cancer. The patient does do self-breast exams. Has noticed LT breast outer quadrant tenderness for a couple months without prior trauma, erythema, masses. Drinks a lot of caffeine daily.   Tobacco use: The patient denies current or previous tobacco use. Alcohol use: none No drug use.  Exercise: mod active  She does get adequate calcium but not Vitamin D in her diet.   Past Medical History:  Diagnosis Date  . Breast mass 05/02/2010   lt breast pain  . Car sickness   . Dysmenorrhea   . Ovarian cyst   . Velamentous insertion of umbilical cord     Past Surgical History:  Procedure Laterality Date  . TYMPANOPLASTY Right 03/21/2018   Procedure: TYMPANOPLASTY WITH GRAFT;  Surgeon: Beverly Gust, MD;  Location: [facility];  Service: ENT;  Laterality: Right;    Family History  Problem Relation Age of Onset  . Depression Mother   . Leukemia Mother   . Hypertension Father   . Depression Maternal Grandmother   . COPD Maternal Grandfather   . COPD Paternal Grandmother   . Lung cancer Paternal Grandmother     Social History   Socioeconomic History  . Marital status: Married    Spouse name: Not on  file  . Number of children: 2  . Years of education: Not on file  . Highest education level: Not on file  Occupational History  . Not on file  Social Needs  . Financial resource strain: Not on file  . Food insecurity    Worry: Not on file    Inability: Not on file  . Transportation needs    Medical: Not on file    Non-medical: Not on file  Tobacco Use  . Smoking status: Never Smoker  . Smokeless tobacco: Never Used  Substance and Sexual Activity  . Alcohol use: Yes    Alcohol/week: 0.0 standard drinks    Comment: On occasion  . Drug use: No  . Sexual activity: Yes    Birth control/protection: None, Condom  Lifestyle  . Physical activity    Days per week: Not on file    Minutes per session: Not on file  . Stress: Not on file  Relationships  . Social Herbalist on phone: Not on file    Gets together: Not on file    Attends religious service: Not on file    Active member of club or organization: Not on file    Attends meetings of clubs or organizations: Not on file    Relationship status: Not on file  . Intimate partner violence    Fear of  current or ex partner: Not on file    Emotionally abused: Not on file    Physically abused: Not on file    Forced sexual activity: Not on file  Other Topics Concern  . Not on file  Social History Narrative  . Not on file    Outpatient Medications Prior to Visit  Medication Sig Dispense Refill  . LORazepam (ATIVAN) 0.5 MG tablet Take 1 tablet (0.5 mg total) by mouth as directed. To be taken once a day , up to 3 times a week for severe panic attacks 15 tablet 1  . venlafaxine XR (EFFEXOR-XR) 75 MG 24 hr capsule Take 1 capsule (75 mg total) by mouth daily with breakfast. 30 capsule 1  . levofloxacin (LEVAQUIN) 500 MG tablet Take 1 tablet (500 mg total) by mouth daily. 7 tablet 0  . predniSONE (DELTASONE) 10 MG tablet 6 tabs today and tomorrow, 5 tabs the next 2 days, decrease by 1 every other day until gone. 42 tablet 0    No facility-administered medications prior to visit.     ROS:  Review of Systems  Constitutional: Negative for fatigue, fever and unexpected weight change.  Respiratory: Negative for cough, shortness of breath and wheezing.   Cardiovascular: Negative for chest pain, palpitations and leg swelling.  Gastrointestinal: Negative for blood in stool, constipation, diarrhea, nausea and vomiting.  Endocrine: Negative for cold intolerance, heat intolerance and polyuria.  Genitourinary: Negative for dyspareunia, dysuria, flank pain, frequency, genital sores, hematuria, menstrual problem, pelvic pain, urgency, vaginal bleeding, vaginal discharge and vaginal pain.  Musculoskeletal: Negative for back pain, joint swelling and myalgias.  Skin: Negative for rash.  Neurological: Negative for dizziness, syncope, light-headedness, numbness and headaches.  Hematological: Negative for adenopathy.  Psychiatric/Behavioral: Positive for agitation and dysphoric mood. Negative for confusion, sleep disturbance and suicidal ideas. The patient is not nervous/anxious.   BREAST: tenderness   Objective: BP 110/80   Ht 5\' 7"  (1.702 m)   Wt 165 lb (74.8 kg)   LMP 03/19/2019 (Approximate)   BMI 25.84 kg/m    Physical Exam Constitutional:      Appearance: She is well-developed.  Genitourinary:     Vulva, vagina, uterus and left adnexa normal.     No vulval lesion or tenderness noted.     No vaginal discharge, erythema or tenderness.     No cervical motion tenderness or polyp.     Uterus is not enlarged or tender.     No right or left adnexal mass present.     Right adnexa tender.     Left adnexa not tender.  Neck:     Musculoskeletal: Normal range of motion.     Thyroid: No thyromegaly.  Cardiovascular:     Rate and Rhythm: Normal rate and regular rhythm.     Heart sounds: Normal heart sounds. No murmur.  Pulmonary:     Effort: Pulmonary effort is normal.     Breath sounds: Normal breath sounds.   Chest:     Breasts:        Right: No mass, nipple discharge, skin change or tenderness.        Left: Tenderness present. No mass, nipple discharge or skin change.     Comments: NEG BREAST EXAM FOR MASSES Abdominal:     Palpations: Abdomen is soft. Abdomen is not rigid.     Tenderness: There is no abdominal tenderness. There is no guarding.  Musculoskeletal: Normal range of motion.  Neurological:     General: No  focal deficit present.     Mental Status: She is alert and oriented to person, place, and time.     Cranial Nerves: No cranial nerve deficit.  Skin:    General: Skin is warm and dry.  Psychiatric:        Mood and Affect: Mood normal.        Behavior: Behavior normal.        Thought Content: Thought content normal.        Judgment: Judgment normal.  Vitals signs reviewed.    Assessment/Plan: Encounter for annual routine gynecological examination  Breast tenderness--neg breast exam. D/C caffeine. F/u prn.         GYN counsel adequate intake of calcium and vitamin D, diet and exercise     F/U  Return in about 1 year (around [id number] 1 yr annual  [place] B. Neamiah Sciarra, PA-C 04/02/2019 4:03 PM

## 2019-04-28 ENCOUNTER — Other Ambulatory Visit: Payer: Self-pay

## 2019-04-28 ENCOUNTER — Encounter: Payer: Self-pay | Admitting: Family Medicine

## 2019-04-28 ENCOUNTER — Ambulatory Visit (INDEPENDENT_AMBULATORY_CARE_PROVIDER_SITE_OTHER): Payer: Commercial Managed Care - PPO | Admitting: Family Medicine

## 2019-04-28 DIAGNOSIS — R197 Diarrhea, unspecified: Secondary | ICD-10-CM

## 2019-04-28 NOTE — Progress Notes (Signed)
There were no vitals taken for this visit.   Subjective:    Patient ID: Gabrielle Jackson, female    DOB: Oct 02, 1990, 28 y.o.   MRN: [id number]  HPI: Gabrielle Jackson is a 28 y.o. female  Chief Complaint  Patient presents with  . hemrrhoids    patient states that she has been having some oily anal leakage also. Has been having stomach issues for about a month.    She notes that the hemorrhoids have been getting worse rather than getting better. She has been having more problems with her stomach. It happened several weeks ago, then got better, and has been bad again for about a month.  ABDOMINAL ISSUES Duration: about a month Nature: bloating, a bit of pain in the lower area Location: lower portions and diffuse  Severity: moderate  Radiation: no Episode duration:  Frequency: constant Treatments attempted: fiber, antacids, PPI, H2 Blocker and laxatives Constipation: no Diarrhea: yes Episodes of diarrhea/day: 1-2x a day Mucous in the stool: no Heartburn: no Bloating:yes Flatulence: yes Nausea: yes Vomiting: no Melena or hematochezia: no Rash: no Jaundice: no Fever: no Weight loss: no   Relevant past medical, surgical, family and social history reviewed and updated as indicated. Interim medical history since our last visit reviewed. Allergies and medications reviewed and updated.  Review of Systems  Constitutional: Negative.   Respiratory: Negative.   Cardiovascular: Negative.   Gastrointestinal: Positive for abdominal distention, abdominal pain, diarrhea and nausea. Negative for anal bleeding, blood in stool, constipation, rectal pain and vomiting.  Musculoskeletal: Negative.   Neurological: Negative.   Psychiatric/Behavioral: Negative.     Per HPI unless specifically indicated above     Objective:    There were no vitals taken for this visit.  Wt Readings from Last 3 Encounters:  04/02/19 165 lb (74.8 kg)  09/01/18 162 lb 6.4 oz (73.7 kg)  07/09/18  160 lb 1 oz (72.6 kg)    Physical Exam Vitals signs and nursing note reviewed.  Constitutional:      General: She is not in acute distress.    Appearance: Normal appearance. She is not ill-appearing, toxic-appearing or diaphoretic.  HENT:     Head: Normocephalic and atraumatic.     Right Ear: External ear normal.     Left Ear: External ear normal.     Nose: Nose normal.     Mouth/Throat:     Mouth: Mucous membranes are moist.     Pharynx: Oropharynx is clear.  Eyes:     General: No scleral icterus.       Right eye: No discharge.        Left eye: No discharge.     Conjunctiva/sclera: Conjunctivae normal.     Pupils: Pupils are equal, round, and reactive to light.  Neck:     Musculoskeletal: Normal range of motion.  Pulmonary:     Effort: Pulmonary effort is normal. No respiratory distress.     Comments: Speaking in full sentences Musculoskeletal: Normal range of motion.  Skin:    Coloration: Skin is not jaundiced or pale.     Findings: No bruising, erythema, lesion or rash.  Neurological:     Mental Status: She is alert and oriented to person, place, and time. Mental status is at baseline.  Psychiatric:        Mood and Affect: Mood normal.        Behavior: Behavior normal.        Thought Content: Thought content normal.  Judgment: Judgment normal.     Results for orders placed or performed in visit on 09/01/18  Microscopic Examination   URINE  Result Value Ref Range   WBC, UA 6-10 (A) 0 - 5 /hpf   RBC, UA None seen 0 - 2 /hpf   Epithelial Cells (non renal) 0-10 0 - 10 /hpf   Bacteria, UA Few (A) None seen/Few  Urine Culture, Reflex   URINE  Result Value Ref Range   Urine Culture, Routine Final report    Organism ID, Bacteria No growth   UA/M w/rflx Culture, Routine   Specimen: Urine   URINE  Result Value Ref Range   Specific Gravity, UA 1.015 1.005 - 1.030   pH, UA 7.5 5.0 - 7.5   Color, UA Yellow Yellow   Appearance Ur Cloudy (A) Clear   Leukocytes,  UA 2+ (A) Negative   Protein, UA Negative Negative/Trace   Glucose, UA Negative Negative   Ketones, UA Negative Negative   RBC, UA Negative Negative   Bilirubin, UA Negative Negative   Urobilinogen, Ur 1.0 0.2 - 1.0 mg/dL   Nitrite, UA Negative Negative   Microscopic Examination See below:    Urinalysis Reflex Comment   CBC With Differential/Platelet  Result Value Ref Range   WBC 7.9 3.4 - 10.8 x10E3/uL   RBC 4.39 3.77 - 5.28 x10E6/uL   Hemoglobin 13.2 11.1 - 15.9 g/dL   Hematocrit 40.5 34.0 - 46.6 %   MCV 92 79 - 97 fL   MCH 30.1 26.6 - 33.0 pg   MCHC 32.6 31.5 - 35.7 g/dL   RDW 12.6 11.7 - 15.4 %   Platelets 279 150 - 450 x10E3/uL   Neutrophils 68 Not Estab. %   Lymphs 25 Not Estab. %   MID 7 Not Estab. %   Neutrophils Absolute 5.4 1.4 - 7.0 x10E3/uL   Lymphocytes Absolute 2.0 0.7 - 3.1 x10E3/uL   MID (Absolute) 0.5 0.1 - 1.6 X10E3/uL      Assessment & Plan:   Problem List Items Addressed This Visit    None    Visit Diagnoses    Diarrhea, unspecified type    -  Primary   Has been having diarrhea off and on for several months- consistent the last month. Will check stool studies and get her into GI. Call with any concerns.    Relevant Orders   Ova and parasite examination   Fecal leukocytes   Stool C-Diff Toxin Assay   Stool Culture   Fecal occult blood, imunochemical([facility])   Ambulatory referral to Gastroenterology       Follow up plan: Return if symptoms worsen or fail to improve.   . This visit was completed via FaceTime due to the restrictions of the COVID-19 pandemic. All issues as above were discussed and addressed. Physical exam was done as above through visual confirmation on FaceTime. If it was felt that the patient should be evaluated in the office, they were directed there. The patient verbally consented to this visit. . Location of the patient: home . Location of the provider: work . Those involved with this call:  . Provider: Park Liter, DO . CMA: Tiffany Reel, CMA . Front Desk/Registration: Don Perking  . Time spent on call: 15 minutes with patient face to face via video conference. More than 50% of this time was spent in counseling and coordination of care. 23 minutes total spent in review of patient's record and preparation of their chart.

## 2019-05-01 ENCOUNTER — Encounter: Payer: Self-pay | Admitting: *Deleted

## 2019-05-07 ENCOUNTER — Encounter: Payer: Self-pay | Admitting: Family Medicine

## 2019-05-08 NOTE — Telephone Encounter (Signed)
LVM for apt 05/08/2019

## 2019-05-11 ENCOUNTER — Encounter: Payer: Self-pay | Admitting: Family Medicine

## 2019-05-11 ENCOUNTER — Other Ambulatory Visit: Payer: Self-pay

## 2019-05-11 ENCOUNTER — Ambulatory Visit (INDEPENDENT_AMBULATORY_CARE_PROVIDER_SITE_OTHER): Payer: Commercial Managed Care - PPO | Admitting: Family Medicine

## 2019-05-11 VITALS — Wt 170.0 lb

## 2019-05-11 DIAGNOSIS — R635 Abnormal weight gain: Secondary | ICD-10-CM

## 2019-05-11 DIAGNOSIS — E663 Overweight: Secondary | ICD-10-CM

## 2019-05-11 MED ORDER — BUPROPION HCL ER (SR) 150 MG PO TB12: ORAL_TABLET | ORAL | 3 refills | Status: DC

## 2019-05-11 NOTE — Progress Notes (Signed)
Wt 170 lb (77.1 kg)   BMI 26.63 kg/m    Subjective:    Patient ID: Gabrielle Jackson, female    DOB: 1991/04/03, 28 y.o.   MRN: [id number]  HPI: Gabrielle Jackson is a 28 y.o. female  Chief Complaint  Patient presents with  . Weight Gain    Patient would like to discuss weight loss options   WEIGHT GAIN- has put on about 30lbs in the past year. Exercising about 84minutes to an hour to a day, has been eating whole foods, not processed, grilled chicken, veggies Duration: About a year Previous attempts at weight loss: yes Complications of obesity: None Peak weight: current Weight loss goal:  Weight loss to date: none  Relevant past medical, surgical, family and social history reviewed and updated as indicated. Interim medical history since our last visit reviewed. Allergies and medications reviewed and updated.  Review of Systems  Constitutional: Negative.   Respiratory: Negative.   Cardiovascular: Negative.   Neurological: Negative.   Psychiatric/Behavioral: Negative.     Per HPI unless specifically indicated above     Objective:    Wt 170 lb (77.1 kg)   BMI 26.63 kg/m   Wt Readings from Last 3 Encounters:  05/11/19 170 lb (77.1 kg)  04/02/19 165 lb (74.8 kg)  09/01/18 162 lb 6.4 oz (73.7 kg)    Physical Exam Vitals signs and nursing note reviewed.  Constitutional:      General: She is not in acute distress.    Appearance: Normal appearance. She is not ill-appearing, toxic-appearing or diaphoretic.  HENT:     Head: Normocephalic and atraumatic.     Right Ear: External ear normal.     Left Ear: External ear normal.     Nose: Nose normal.     Mouth/Throat:     Mouth: Mucous membranes are moist.     Pharynx: Oropharynx is clear.  Eyes:     General: No scleral icterus.       Right eye: No discharge.        Left eye: No discharge.     Conjunctiva/sclera: Conjunctivae normal.     Pupils: Pupils are equal, round, and reactive to light.  Neck:      Musculoskeletal: Normal range of motion.  Pulmonary:     Effort: Pulmonary effort is normal. No respiratory distress.     Comments: Speaking in full sentences Musculoskeletal: Normal range of motion.  Skin:    Coloration: Skin is not jaundiced or pale.     Findings: No bruising, erythema, lesion or rash.  Neurological:     Mental Status: She is alert and oriented to person, place, and time. Mental status is at baseline.  Psychiatric:        Mood and Affect: Mood normal.        Behavior: Behavior normal.        Thought Content: Thought content normal.        Judgment: Judgment normal.     Results for orders placed or performed in visit on 09/01/18  Microscopic Examination   URINE  Result Value Ref Range   WBC, UA 6-10 (A) 0 - 5 /hpf   RBC, UA None seen 0 - 2 /hpf   Epithelial Cells (non renal) 0-10 0 - 10 /hpf   Bacteria, UA Few (A) None seen/Few  Urine Culture, Reflex   URINE  Result Value Ref Range   Urine Culture, Routine Final report    Organism ID, Bacteria No  growth   UA/M w/rflx Culture, Routine   Specimen: Urine   URINE  Result Value Ref Range   Specific Gravity, UA 1.015 1.005 - 1.030   pH, UA 7.5 5.0 - 7.5   Color, UA Yellow Yellow   Appearance Ur Cloudy (A) Clear   Leukocytes, UA 2+ (A) Negative   Protein, UA Negative Negative/Trace   Glucose, UA Negative Negative   Ketones, UA Negative Negative   RBC, UA Negative Negative   Bilirubin, UA Negative Negative   Urobilinogen, Ur 1.0 0.2 - 1.0 mg/dL   Nitrite, UA Negative Negative   Microscopic Examination See below:    Urinalysis Reflex Comment   CBC With Differential/Platelet  Result Value Ref Range   WBC 7.9 3.4 - 10.8 x10E3/uL   RBC 4.39 3.77 - 5.28 x10E6/uL   Hemoglobin 13.2 11.1 - 15.9 g/dL   Hematocrit 40.5 34.0 - 46.6 %   MCV 92 79 - 97 fL   MCH 30.1 26.6 - 33.0 pg   MCHC 32.6 31.5 - 35.7 g/dL   RDW 12.6 11.7 - 15.4 %   Platelets 279 150 - 450 x10E3/uL   Neutrophils 68 Not Estab. %   Lymphs 25  Not Estab. %   MID 7 Not Estab. %   Neutrophils Absolute 5.4 1.4 - 7.0 x10E3/uL   Lymphocytes Absolute 2.0 0.7 - 3.1 x10E3/uL   MID (Absolute) 0.5 0.1 - 1.6 X10E3/uL      Assessment & Plan:   Problem List Items Addressed This Visit    None    Visit Diagnoses    Overweight    -  Primary   Encouraged her to come get her labs. Work on diet and exercise. Will start wellbutrin to help with cravings. Recheck 4-6 weeks.    Weight gain       Will check labs. Await results.    Relevant Orders   Thyroid Panel With TSH       Follow up plan: Return 4-6 weeks, for follow up weight.   . This visit was completed via FaceTime due to the restrictions of the COVID-19 pandemic. All issues as above were discussed and addressed. Physical exam was done as above through visual confirmation on FaceTime. If it was felt that the patient should be evaluated in the office, they were directed there. The patient verbally consented to this visit. . Location of the patient: home . Location of the provider: home . Those involved with this call:  . Provider: Park Liter, DO . CMA: Tiffany Reel, CMA . Front Desk/Registration: Don Perking  . Time spent on call: 15 minutes with patient face to face via video conference. More than 50% of this time was spent in counseling and coordination of care. 23 minutes total spent in review of patient's record and preparation of their chart.

## 2019-05-12 ENCOUNTER — Encounter: Payer: Self-pay | Admitting: Psychiatry

## 2019-05-12 ENCOUNTER — Ambulatory Visit (INDEPENDENT_AMBULATORY_CARE_PROVIDER_SITE_OTHER): Payer: Commercial Managed Care - PPO | Admitting: Psychiatry

## 2019-05-12 VITALS — Ht 67.0 in | Wt 170.0 lb

## 2019-05-12 DIAGNOSIS — Z634 Disappearance and death of family member: Secondary | ICD-10-CM | POA: Diagnosis not present

## 2019-05-12 DIAGNOSIS — F4010 Social phobia, unspecified: Secondary | ICD-10-CM

## 2019-05-12 DIAGNOSIS — F411 Generalized anxiety disorder: Secondary | ICD-10-CM

## 2019-05-12 DIAGNOSIS — F324 Major depressive disorder, single episode, in partial remission: Secondary | ICD-10-CM

## 2019-05-12 MED ORDER — VENLAFAXINE HCL ER 75 MG PO CP24: 75.0000 mg | ORAL_CAPSULE | Freq: Every day | ORAL | 1 refills | Status: DC

## 2019-05-12 NOTE — Progress Notes (Signed)
Virtual Visit via Video Note  I connected with Gabrielle Jackson on 05/12/19 at  4:00 PM EST by a video enabled telemedicine application and verified that I am speaking with the correct person using two identifiers.   I discussed the limitations of evaluation and management by telemedicine and the availability of in person appointments. The patient expressed understanding and agreed to proceed.     I discussed the assessment and treatment plan with the patient. The patient was provided an opportunity to ask questions and all were answered. The patient agreed with the plan and demonstrated an understanding of the instructions.   The patient was advised to call back or seek an in-person evaluation if the symptoms worsen or if the condition fails to improve as anticipated.   [facility] MD OP Progress Note  05/12/2019 5:06 PM Gabrielle Jackson  MRN:  [id number]  Chief Complaint:  Chief Complaint    Follow-up     HPI: Gabrielle Jackson is a 28 year old Caucasian female, married, employed, lives in [place], has a history of anxiety disorder, depression, bereavement, was evaluated by telemedicine today.  Patient today reports she is currently coping with her grief.  She denies any significant anxiety or depressive symptoms.  She is compliant on her Effexor.  She reports sleep is good.  She reports she continues to be in psychotherapy sessions with Ms. Miguel Dibble and that is going well.  She reports she struggles with being overweight and was recently started on Wellbutrin by her primary care provider for weight loss.  She reports she has not started taking the medication yet.  She is supposed to gradually increase the dosage to 600 mg daily.  Patient denies any suicidality, homicidality or perceptual disturbances.  Patient denies any other concerns today. Visit Diagnosis:    ICD-10-CM   1. GAD (generalized anxiety disorder)  F41.1 venlafaxine XR (EFFEXOR-XR) 75 MG 24 hr capsule  2. MDD (major  depressive disorder), single episode, in partial remission ([place])  F32.4   3. Social anxiety disorder  F40.10   4. Bereavement  Z63.4     Past Psychiatric History: I have reviewed past psychiatric history from my progress note on 02/13/2019.  Past trials of Effexor, lorazepam, BuSpar  Past Medical History:  Past Medical History:  Diagnosis Date  . Breast mass 05/02/2010   lt breast pain  . Car sickness   . Dysmenorrhea   . Ovarian cyst   . Velamentous insertion of umbilical cord     Past Surgical History:  Procedure Laterality Date  . TYMPANOPLASTY Right 03/21/2018   Procedure: TYMPANOPLASTY WITH GRAFT;  Surgeon: Beverly Gust, MD;  Location: [facility];  Service: ENT;  Laterality: Right;    Family Psychiatric History: I have reviewed family psychiatric history from my progress note on 02/13/2019  Family History:  Family History  Problem Relation Age of Onset  . Depression Mother   . Leukemia Mother   . Hypertension Father   . Depression Maternal Grandmother   . COPD Maternal Grandfather   . COPD Paternal Grandmother   . Lung cancer Paternal Grandmother     Social History: I have reviewed social history from my progress note on 02/13/2019 Social History   Socioeconomic History  . Marital status: Married    Spouse name: Not on file  . Number of children: 2  . Years of education: Not on file  . Highest education level: Not on file  Occupational History  . Not on file  Social Needs  .  Financial resource strain: Not on file  . Food insecurity    Worry: Not on file    Inability: Not on file  . Transportation needs    Medical: Not on file    Non-medical: Not on file  Tobacco Use  . Smoking status: Never Smoker  . Smokeless tobacco: Never Used  Substance and Sexual Activity  . Alcohol use: Yes    Alcohol/week: 0.0 standard drinks    Comment: On occasion  . Drug use: No  . Sexual activity: Yes    Birth control/protection: None, Condom  Lifestyle  .  Physical activity    Days per week: Not on file    Minutes per session: Not on file  . Stress: Not on file  Relationships  . Social Herbalist on phone: Not on file    Gets together: Not on file    Attends religious service: Not on file    Active member of club or organization: Not on file    Attends meetings of clubs or organizations: Not on file    Relationship status: Not on file  Other Topics Concern  . Not on file  Social History Narrative  . Not on file    Allergies:  Allergies  Allergen Reactions  . Penicillins Rash    Metabolic Disorder Labs: No results found for: HGBA1C, MPG No results found for: PROLACTIN Lab Results  Component Value Date   CHOL 94 (L) 06/11/2018   TRIG 61 06/11/2018   HDL 43 06/11/2018   LDLCALC 39 06/11/2018   Lab Results  Component Value Date   TSH 0.968 06/11/2018   TSH 0.995 03/01/2017    Therapeutic Level Labs: No results found for: LITHIUM No results found for: VALPROATE No components found for:  CBMZ  Current Medications: Current Outpatient Medications  Medication Sig Dispense Refill  . buPROPion (WELLBUTRIN SR) 150 MG 12 hr tablet 1 tablet in the AM for 3 days, then increase to 1 tab BID for 1 week, then 2 in the AM for 3 days, then 2 BID after that 120 tablet 3  . LORazepam (ATIVAN) 0.5 MG tablet Take 1 tablet (0.5 mg total) by mouth as directed. To be taken once a day , up to 3 times a week for severe panic attacks 15 tablet 1  . venlafaxine XR (EFFEXOR-XR) 75 MG 24 hr capsule Take 1 capsule (75 mg total) by mouth daily with breakfast. 30 capsule 1   No current facility-administered medications for this visit.      Musculoskeletal: Strength & Muscle Tone: UTA Gait & Station: normal Patient leans: N/A  Psychiatric Specialty Exam: Review of Systems  Psychiatric/Behavioral: Negative for depression, hallucinations, substance abuse and suicidal ideas. The patient is not nervous/anxious and does not have  insomnia.   All other systems reviewed and are negative.   Height 5\' 7"  (1.702 m), weight 170 lb (77.1 kg).Body mass index is 26.63 kg/m.  General Appearance: Casual  Eye Contact:  Fair  Speech:  Clear and Coherent  Volume:  Normal  Mood:  Euthymic  Affect:  Congruent  Thought Process:  Goal Directed and Descriptions of Associations: Intact  Orientation:  Full (Time, Place, and Person)  Thought Content: Logical   Suicidal Thoughts:  No  Homicidal Thoughts:  No  Memory:  Immediate;   Fair Recent;   Fair Remote;   Fair  Judgement:  Fair  Insight:  Fair  Psychomotor Activity:  Normal  Concentration:  Concentration: Fair and Attention  Span: Fair  Recall:  [employer] of Knowledge: Fair  Language: Fair  Akathisia:  No  Handed:  Right  AIMS (if indicated): Denies tremors, rigidity  Assets:  Communication Skills Desire for Improvement Housing Social Support  ADL's:  Intact  Cognition: WNL  Sleep:  Fair   Screenings: GAD-7     Office Visit from 02/13/2019 in [facility] Office Visit from 12/16/2018 in [facility] Visit from 07/09/2018 in [facility] Visit from 06/11/2018 in [facility] Visit from 01/29/2018 in [facility]  Total GAD-7 Score  18  18  3  17   0    PHQ2-9     Office Visit from 02/13/2019 in [facility] Office Visit from 12/16/2018 in [facility] Visit from 07/09/2018 in [facility] Visit from 06/11/2018 in [facility] Visit from 01/29/2018 in [facility]  PHQ-2 Total Score  3  4  0  3  0  PHQ-9 Total Score  13  19  3  14  1        Assessment and Plan: Gabrielle Jackson is a 28 year old Caucasian female, married, employed, lives in [place], has a history of anxiety disorder, depression was evaluated by telemedicine today.  Patient is biologically predisposed given her family history of  mental health problems.  She also has psychosocial stressors of her mother's recent death, grandmother's death , husband's mental health problems.  Patient continues to make progress on the current medication regimen.  Plan as noted below.  Plan MDD in partial remission Effexor at low dosage of 75 mg p.o. daily Continue psychotherapy sessions with Ms. Miguel Dibble  GAD-improving Effexor as prescribed Continue CBT   Social anxiety disorder-improving She will continue to work with her therapist  Bereavement-improving Lorazepam 0.5 mg as needed for severe anxiety attacks that she has been using it very rarely. Continue psychotherapy sessions  Patient recently was prescribed Wellbutrin by her primary care provider for weight loss.  Discussed the interaction between Wellbutrin and Effexor.  Also provided medication education about adverse side effects, worsening anxiety, irritability and sleep problems on Wellbutrin.  Follow-up in clinic in 2 months or sooner if needed.  January 7 at 4 PM  I have spent atleast 15 minutes non face to face with patient today. More than 50 % of the time was spent for psychoeducation and supportive psychotherapy and care coordination. This note was generated in part or whole with voice recognition software. Voice recognition is usually quite accurate but there are transcription errors that can and very often do occur. I apologize for any typographical errors that were not detected and corrected.      Ursula Alert, MD 05/12/2019, 5:06 PM

## 2019-06-10 ENCOUNTER — Encounter: Payer: Self-pay | Admitting: Family Medicine

## 2019-06-15 ENCOUNTER — Telehealth: Payer: Self-pay

## 2019-06-15 DIAGNOSIS — F411 Generalized anxiety disorder: Secondary | ICD-10-CM

## 2019-06-15 MED ORDER — LORAZEPAM 0.5 MG PO TABS: 0.5000 mg | ORAL_TABLET | ORAL | 1 refills | Status: DC

## 2019-06-15 NOTE — Telephone Encounter (Signed)
pt called left message that she needs a refill on her medication lorazepam

## 2019-06-15 NOTE — Telephone Encounter (Signed)
Sent lorazepam to pharmacy.

## 2019-06-16 ENCOUNTER — Other Ambulatory Visit: Payer: Self-pay | Admitting: Family Medicine

## 2019-06-19 ENCOUNTER — Telehealth: Payer: Self-pay | Admitting: Psychiatry

## 2019-06-19 ENCOUNTER — Other Ambulatory Visit: Payer: Self-pay | Admitting: Psychiatry

## 2019-06-19 DIAGNOSIS — F411 Generalized anxiety disorder: Secondary | ICD-10-CM

## 2019-06-19 MED ORDER — VENLAFAXINE HCL ER 75 MG PO CP24: 75.0000 mg | ORAL_CAPSULE | Freq: Every day | ORAL | 1 refills | Status: DC

## 2019-06-19 NOTE — Telephone Encounter (Signed)
Sent effexor 75 mg to pharmacy.

## 2019-06-24 ENCOUNTER — Emergency Department: Payer: Commercial Managed Care - PPO

## 2019-06-24 ENCOUNTER — Other Ambulatory Visit: Payer: Self-pay

## 2019-06-24 ENCOUNTER — Encounter: Payer: Self-pay | Admitting: Family Medicine

## 2019-06-24 ENCOUNTER — Emergency Department
Admission: EM | Admit: 2019-06-24 | Discharge: 2019-06-24 | Disposition: A | Discharge status: Home / Self Care | Payer: Commercial Managed Care - PPO | Attending: Student in an Organized Health Care Education/Training Program | Admitting: Student in an Organized Health Care Education/Training Program

## 2019-06-24 DIAGNOSIS — N1330 Unspecified hydronephrosis: Secondary | ICD-10-CM | POA: Diagnosis not present

## 2019-06-24 DIAGNOSIS — N200 Calculus of kidney: Secondary | ICD-10-CM

## 2019-06-24 DIAGNOSIS — R109 Unspecified abdominal pain: Secondary | ICD-10-CM | POA: Diagnosis present

## 2019-06-24 DIAGNOSIS — N201 Calculus of ureter: Secondary | ICD-10-CM | POA: Insufficient documentation

## 2019-06-24 DIAGNOSIS — Z79899 Other long term (current) drug therapy: Secondary | ICD-10-CM | POA: Diagnosis not present

## 2019-06-24 LAB — CBC
HCT: 42 % (ref 36.0–46.0)
Hemoglobin: 14.9 g/dL (ref 12.0–15.0)
MCH: 30.8 pg (ref 26.0–34.0)
MCHC: 35.5 g/dL (ref 30.0–36.0)
MCV: 87 fL (ref 80.0–100.0)
Platelets: 341 10*3/uL (ref 150–400)
RBC: 4.83 MIL/uL (ref 3.87–5.11)
RDW: 11.9 % (ref 11.5–15.5)
WBC: 14.7 10*3/uL — ABNORMAL HIGH (ref 4.0–10.5)
nRBC: 0 % (ref 0.0–0.2)

## 2019-06-24 LAB — URINALYSIS, COMPLETE (UACMP) WITH MICROSCOPIC
Bilirubin Urine: NEGATIVE
Glucose, UA: NEGATIVE mg/dL
Ketones, ur: NEGATIVE mg/dL
Leukocytes,Ua: NEGATIVE
Nitrite: NEGATIVE
Protein, ur: 100 mg/dL — AB
RBC / HPF: >50 RBC/hpf — ABNORMAL HIGH (ref 0–5)
Specific Gravity, Urine: 1.02 (ref 1.005–1.030)
pH: 8 (ref 5.0–8.0)

## 2019-06-24 LAB — BASIC METABOLIC PANEL
Anion gap: 12 (ref 5–15)
BUN: 11 mg/dL (ref 6–20)
CO2: 20 mmol/L — ABNORMAL LOW (ref 22–32)
Calcium: 9.5 mg/dL (ref 8.9–10.3)
Chloride: 108 mmol/L (ref 98–111)
Creatinine, Ser: 0.89 mg/dL (ref 0.44–1.00)
GFR calc Af Amer: >60 mL/min (ref >60)
GFR calc non Af Amer: >60 mL/min (ref >60)
Glucose, Bld: 112 mg/dL — ABNORMAL HIGH (ref 70–99)
Potassium: 3.6 mmol/L (ref 3.5–5.1)
Sodium: 140 mmol/L (ref 135–145)

## 2019-06-24 LAB — POCT PREGNANCY, URINE: Preg Test, Ur: NEGATIVE

## 2019-06-24 MED ORDER — SODIUM CHLORIDE 0.9 % IV BOLUS
1000.0000 mL | Freq: Once | INTRAVENOUS | Status: AC
  Administered 2019-06-24: 1000 mL via INTRAVENOUS

## 2019-06-24 MED ORDER — OXYCODONE-ACETAMINOPHEN 5-325 MG PO TABS
1.0000 | ORAL_TABLET | Freq: Once | ORAL | Status: AC
  Administered 2019-06-24: 1 via ORAL
  Filled 2019-06-24: qty 1

## 2019-06-24 MED ORDER — KETOROLAC TROMETHAMINE 30 MG/ML IJ SOLN
15.0000 mg | Freq: Once | INTRAMUSCULAR | Status: AC
  Administered 2019-06-24: 15 mg via INTRAVENOUS
  Filled 2019-06-24: qty 1

## 2019-06-24 MED ORDER — ONDANSETRON HCL 4 MG/2ML IJ SOLN
4.0000 mg | Freq: Once | INTRAMUSCULAR | Status: AC
  Administered 2019-06-24: 4 mg via INTRAVENOUS
  Filled 2019-06-24: qty 2

## 2019-06-24 MED ORDER — ONDANSETRON HCL 4 MG PO TABS: 4.0000 mg | ORAL_TABLET | Freq: Every day | ORAL | 0 refills | Status: DC | PRN

## 2019-06-24 MED ORDER — FENTANYL CITRATE (PF) 100 MCG/2ML IJ SOLN
50.0000 ug | INTRAMUSCULAR | Status: DC | PRN
  Administered 2019-06-24: 50 ug via INTRAVENOUS
  Filled 2019-06-24: qty 2

## 2019-06-24 MED ORDER — HYDROCODONE-ACETAMINOPHEN 5-325 MG PO TABS: 1.0000 | ORAL_TABLET | ORAL | 0 refills | Status: DC | PRN

## 2019-06-24 MED ORDER — IOHEXOL 300 MG/ML  SOLN
100.0000 mL | Freq: Once | INTRAMUSCULAR | Status: AC | PRN
  Administered 2019-06-24: 100 mL via INTRAVENOUS
  Filled 2019-06-24: qty 100

## 2019-06-24 MED ORDER — MORPHINE SULFATE (PF) 4 MG/ML IV SOLN
4.0000 mg | INTRAVENOUS | Status: DC | PRN
  Administered 2019-06-24: 4 mg via INTRAVENOUS
  Filled 2019-06-24: qty 1

## 2019-06-24 NOTE — ED Provider Notes (Signed)
[facility] Emergency Department Provider Note    First MD Initiated Contact with Patient 06/24/19 1322     (approximate)  I have reviewed the triage vital signs and the nursing notes.   HISTORY  Chief Complaint Flank Pain    HPI Gabrielle Jackson is a 28 y.o. female close past medical history presents to the ER with new symptom of left flank pain radiating to left lower quadrant starting this morning.  Denies any dysuria.  Has been having chills.  Is associated with nausea and vomiting secondary to pain rates the pain is moderate to severe.  Is not taking anything for the pain.  Denies any chest being pregnant.  States she does have history of right-sided ovarian cyst.  Denies any vaginal discharge.  Is sexually active.    Past Medical History:  Diagnosis Date  . Breast mass 05/02/2010   lt breast pain  . Car sickness   . Dysmenorrhea   . Ovarian cyst   . Velamentous insertion of umbilical cord    Family History  Problem Relation Age of Onset  . Depression Mother   . Leukemia Mother   . Hypertension Father   . Depression Maternal Grandmother   . COPD Maternal Grandfather   . COPD Paternal Grandmother   . Lung cancer Paternal Grandmother    Past Surgical History:  Procedure Laterality Date  . TYMPANOPLASTY Right 03/21/2018   Procedure: TYMPANOPLASTY WITH GRAFT;  Surgeon: Beverly Gust, MD;  Location: [facility];  Service: ENT;  Laterality: Right;   Patient Active Problem List   Diagnosis Date Noted  . MDD (major depressive disorder), single episode, in partial remission ([place]) 05/12/2019  . Social anxiety disorder 04/01/2019  . Bereavement 03/13/2019  . Depressive disorder 02/13/2019  . GAD (generalized anxiety disorder) 09/05/2015  . Partial hamstring tear 05/02/2015      Prior to Admission medications   Medication Sig Start Date End Date Taking? Authorizing Provider  buPROPion (WELLBUTRIN SR) 150 MG 12 hr tablet 1  tablet in the AM for 3 days, then increase to 1 tab BID for 1 week, then 2 in the AM for 3 days, then 2 BID after that 05/11/19   Park Liter P, DO  HYDROcodone-acetaminophen (NORCO) 5-325 MG tablet Take 1 tablet by mouth every 4 (four) hours as needed for moderate pain. 06/24/19   Merlyn Lot, MD  LORazepam (ATIVAN) 0.5 MG tablet Take 1 tablet (0.5 mg total) by mouth as directed. To be taken once a day , up to 3 times a week for severe panic attacks 06/15/19   Ursula Alert, MD  ondansetron (ZOFRAN) 4 MG tablet Take 1 tablet (4 mg total) by mouth daily as needed. 06/24/19 06/23/20  Merlyn Lot, MD  venlafaxine XR (EFFEXOR-XR) 75 MG 24 hr capsule Take 1 capsule (75 mg total) by mouth daily with breakfast. 06/19/19   Ursula Alert, MD    Allergies Penicillins    Social History Social History   Tobacco Use  . Smoking status: Never Smoker  . Smokeless tobacco: Never Used  Substance Use Topics  . Alcohol use: Yes    Alcohol/week: 0.0 standard drinks    Comment: On occasion  . Drug use: No    Review of Systems Patient denies headaches, rhinorrhea, blurry vision, numbness, shortness of breath, chest pain, edema, cough, abdominal pain, nausea, vomiting, diarrhea, dysuria, fevers, rashes or hallucinations unless otherwise stated above in HPI. ____________________________________________   PHYSICAL EXAM:  VITAL SIGNS: Vitals:  06/24/19 1231 06/24/19 1347  BP: 120/90 109/75  Pulse: 72 65  Resp: 20 18  Temp: (!) 97.5 F (36.4 C)   SpO2: 100% 100%    Constitutional: Alert and oriented.  Eyes: Conjunctivae are normal.  Head: Atraumatic. Nose: No congestion/rhinnorhea. Mouth/Throat: Mucous membranes are moist.   Neck: No stridor. Painless ROM.  Cardiovascular: Normal rate, regular rhythm. Grossly normal heart sounds.  Good peripheral circulation. Respiratory: Normal respiratory effort.  No retractions. Lungs CTAB. Gastrointestinal: Soft with ttp in LLQ. No  distention. No abdominal bruits. No CVA tenderness. Genitourinary:  deferred Musculoskeletal: No lower extremity tenderness nor edema.  No joint effusions. Neurologic:  Normal speech and language. No gross focal neurologic deficits are appreciated. No facial droop Skin:  Skin is warm, dry and intact. No rash noted. Psychiatric: Mood and affect are normal. Speech and behavior are normal.  ____________________________________________   LABS (all labs ordered are listed, but only abnormal results are displayed)  Results for orders placed or performed during the hospital encounter of 06/24/19 (from the past 24 hour(s))  Urinalysis, Complete w Microscopic     Status: Abnormal   Collection Time: 06/24/19 12:54 PM  Result Value Ref Range   Color, Urine YELLOW (A) YELLOW   APPearance CLOUDY (A) CLEAR   Specific Gravity, Urine 1.020 1.005 - 1.030   pH 8.0 5.0 - 8.0   Glucose, UA NEGATIVE NEGATIVE mg/dL   Hgb urine dipstick MODERATE (A) NEGATIVE   Bilirubin Urine NEGATIVE NEGATIVE   Ketones, ur NEGATIVE NEGATIVE mg/dL   Protein, ur 100 (A) NEGATIVE mg/dL   Nitrite NEGATIVE NEGATIVE   Leukocytes,Ua NEGATIVE NEGATIVE   RBC / HPF >50 (H) 0 - 5 RBC/hpf   WBC, UA 0-5 0 - 5 WBC/hpf   Bacteria, UA RARE (A) NONE SEEN   Squamous Epithelial / LPF 0-5 0 - 5   Mucus PRESENT    Amorphous Crystal PRESENT   Basic metabolic panel     Status: Abnormal   Collection Time: 06/24/19 12:54 PM  Result Value Ref Range   Sodium 140 135 - 145 mmol/L   Potassium 3.6 3.5 - 5.1 mmol/L   Chloride 108 98 - 111 mmol/L   CO2 20 (L) 22 - 32 mmol/L   Glucose, Bld 112 (H) 70 - 99 mg/dL   BUN 11 6 - 20 mg/dL   Creatinine, Ser 0.89 0.44 - 1.00 mg/dL   Calcium 9.5 8.9 - 10.3 mg/dL   GFR calc non Af Amer >60 >60 mL/min   GFR calc Af Amer >60 >60 mL/min   Anion gap 12 5 - 15  CBC     Status: Abnormal   Collection Time: 06/24/19 12:54 PM  Result Value Ref Range   WBC 14.7 (H) 4.0 - 10.5 K/uL   RBC 4.83 3.87 - 5.11  MIL/uL   Hemoglobin 14.9 12.0 - 15.0 g/dL   HCT 42.0 36.0 - 46.0 %   MCV 87.0 80.0 - 100.0 fL   MCH 30.8 26.0 - 34.0 pg   MCHC 35.5 30.0 - 36.0 g/dL   RDW 11.9 11.5 - 15.5 %   Platelets 341 150 - 400 K/uL   nRBC 0.0 0.0 - 0.2 %  Pregnancy, urine POC     Status: None   Collection Time: 06/24/19  1:46 PM  Result Value Ref Range   Preg Test, Ur NEGATIVE NEGATIVE   ____________________________________________ _______________________  RADIOLOGY  I personally reviewed all radiographic images ordered to evaluate for the above acute complaints and  reviewed radiology reports and findings.  These findings were personally discussed with the patient.  Please see medical record for radiology report.  ____________________________________________   PROCEDURES  Procedure(s) performed:  Procedures    Critical Care performed: no ____________________________________________   INITIAL IMPRESSION / ASSESSMENT AND PLAN / ED COURSE  Pertinent labs & imaging results that were available during my care of the patient were reviewed by me and considered in my medical decision making (see chart for details).   DDX: stone, pyelo, diverticulitis, colitis, hernia, ectopic, torsion, toa  Gabrielle Jackson is a 28 y.o. who presents to the ED with left lower quadrant pain as described above.  Given location of pain mild white count will give pain medication and IV fluids as well as order CT imaging to evaluate for the by differential.  Clinical Course as of Jun 23 1612  Wed Jun 24, 2019  1555 Urinalysis showed no evidence of infection no wbc, nitrite negative, leukocytes negative.  Rare bacteria.  No tachycardia and she is not febrile.  Pain improved.  Will give dose of Toradol.   [PR]  [id number] Patient is pain-free at this time.  She is appropriate for outpatient follow-up.  Discussed return precautions.  Have discussed with the patient and available family all diagnostics and treatments performed thus  far and all questions were answered to the best of my ability. The patient demonstrates understanding and agreement with plan.    [PR]    Clinical Course User Index [PR] Merlyn Lot, MD    The patient was evaluated in Emergency Department today for the symptoms described in the history of present illness. He/she was evaluated in the context of the global COVID-19 pandemic, which necessitated consideration that the patient might be at risk for infection with the SARS-CoV-2 virus that causes COVID-19. Institutional protocols and algorithms that pertain to the evaluation of patients at risk for COVID-19 are in a state of rapid change based on information released by regulatory bodies including the CDC and federal and state organizations. These policies and algorithms were followed during the patient's care in the ED.  As part of my medical decision making, I reviewed the following data within the [facility] notes reviewed and incorporated, Labs reviewed, notes from prior ED visits and [place] Controlled Substance Database   ____________________________________________   FINAL CLINICAL IMPRESSION(S) / ED DIAGNOSES  Final diagnoses:  Left flank pain  Kidney stone      NEW MEDICATIONS STARTED DURING THIS VISIT:  New Prescriptions   HYDROCODONE-ACETAMINOPHEN (NORCO) 5-325 MG TABLET    Take 1 tablet by mouth every 4 (four) hours as needed for moderate pain.   ONDANSETRON (ZOFRAN) 4 MG TABLET    Take 1 tablet (4 mg total) by mouth daily as needed.     Note:  This document was prepared using Dragon voice recognition software and may include unintentional dictation errors.    Merlyn Lot, MD 06/24/19 [id number]

## 2019-06-24 NOTE — ED Triage Notes (Signed)
Pt c/o left flank pain radiating around to the LLQ for the past 2 hrs with N/V. "I think I have a kidney stone". Pt denies hx but states her father and aunt both have them

## 2019-06-24 NOTE — ED Notes (Addendum)
NAD noted at time of D/C. Pt denies questions or concerns, pt given urine strainer and explained to strain all urine. Pt ambulatory to the lobby at this time. Pt refused wheelchair to the lobby.

## 2019-07-09 ENCOUNTER — Ambulatory Visit (INDEPENDENT_AMBULATORY_CARE_PROVIDER_SITE_OTHER): Payer: Commercial Managed Care - PPO | Admitting: Psychiatry

## 2019-07-09 ENCOUNTER — Other Ambulatory Visit: Payer: Self-pay

## 2019-07-09 ENCOUNTER — Encounter: Payer: Self-pay | Admitting: Psychiatry

## 2019-07-09 DIAGNOSIS — F324 Major depressive disorder, single episode, in partial remission: Secondary | ICD-10-CM

## 2019-07-09 DIAGNOSIS — F4010 Social phobia, unspecified: Secondary | ICD-10-CM | POA: Diagnosis not present

## 2019-07-09 DIAGNOSIS — Z634 Disappearance and death of family member: Secondary | ICD-10-CM | POA: Diagnosis not present

## 2019-07-09 DIAGNOSIS — F411 Generalized anxiety disorder: Secondary | ICD-10-CM

## 2019-07-09 MED ORDER — VENLAFAXINE HCL 100 MG PO TABS: 100.0000 mg | ORAL_TABLET | Freq: Two times a day (BID) | ORAL | 1 refills | Status: DC

## 2019-07-09 NOTE — Progress Notes (Signed)
Virtual Visit via Video Note  I connected with Gabrielle Jackson on 07/09/19 at  4:00 PM EST by a video enabled telemedicine application and verified that I am speaking with the correct person using two identifiers.   I discussed the limitations of evaluation and management by telemedicine and the availability of in person appointments. The patient expressed understanding and agreed to proceed.     I discussed the assessment and treatment plan with the patient. The patient was provided an opportunity to ask questions and all were answered. The patient agreed with the plan and demonstrated an understanding of the instructions.   The patient was advised to call back or seek an in-person evaluation if the symptoms worsen or if the condition fails to improve as anticipated.   Gabrielle Jackson OP Progress Note  07/09/2019 6:12 PM Gabrielle Jackson  MRN:  [id number]  Chief Complaint:  Chief Complaint    Follow-up     HPI: Gabrielle Jackson is a 29 year old Caucasian female, married, employed, lives in [place], has a history of generalized anxiety disorder, MDD, social anxiety disorder, depression was evaluated by telemedicine today.  Patient today reports she continues to struggle with depressive symptoms.  She reports the holidays are hard since this was the first time she spent holidays after her mother and her grandmother's death.  Patient reports she continues to struggle with relationship struggles with her husband. It was going okay for a while however it spiraling down again.  She reports she has not seen [facility] a while and hence is planning to make an appointment soon.  She is also starting couples counseling soon.  She does not know if that is going to help however reports she is going to give it a try.  She and her husband did try couples counseling previously which did not work.  Patient reports she hence is currently struggling with sadness, crying spells and there are times when she feels it overwhelms  her.  She reports sleep is good.  Patient denies any suicidality, homicidality or perceptual disturbances.  Patient denies any other concerns today. Visit Diagnosis:    ICD-10-CM   1. GAD (generalized anxiety disorder)  F41.1 venlafaxine (EFFEXOR) 100 MG tablet  2. MDD (major depressive disorder), single episode, in partial remission (HCC)  F32.4 venlafaxine (EFFEXOR) 100 MG tablet  3. Social anxiety disorder  F40.10   4. Bereavement  Z63.4     Past Psychiatric History: I have reviewed past psychiatric history from my progress note on 02/13/2019.  Past trials of Effexor, lorazepam, BuSpar  Past Medical History:  Past Medical History:  Diagnosis Date  . Breast mass 05/02/2010   lt breast pain  . Car sickness   . Dysmenorrhea   . Ovarian cyst   . Velamentous insertion of umbilical cord     Past Surgical History:  Procedure Laterality Date  . TYMPANOPLASTY Right 03/21/2018   Procedure: TYMPANOPLASTY WITH GRAFT;  Surgeon: Beverly Gust, Jackson;  Location: [facility];  Service: ENT;  Laterality: Right;    Family Psychiatric History: I have reviewed family psychiatric history from my progress note on 02/13/2019.  Family History:  Family History  Problem Relation Age of Onset  . Depression Mother   . Leukemia Mother   . Hypertension Father   . Depression Maternal Grandmother   . COPD Maternal Grandfather   . COPD Paternal Grandmother   . Lung cancer Paternal Grandmother     Social History: I have reviewed social history from my progress  note on 02/13/2019. Social History   Socioeconomic History  . Marital status: Married    Spouse name: Not on file  . Number of children: 2  . Years of education: Not on file  . Highest education level: Not on file  Occupational History  . Not on file  Tobacco Use  . Smoking status: Never Smoker  . Smokeless tobacco: Never Used  Substance and Sexual Activity  . Alcohol use: Yes    Alcohol/week: 0.0 standard drinks     Comment: On occasion  . Drug use: No  . Sexual activity: Yes    Birth control/protection: None, Condom  Other Topics Concern  . Not on file  Social History Narrative  . Not on file   Social Determinants of Health   Financial Resource Strain:   . Difficulty of Paying Living Expenses: Not on file  Food Insecurity:   . Worried About Charity fundraiser in the Last Year: Not on file  . Ran Out of Food in the Last Year: Not on file  Transportation Needs:   . Lack of Transportation (Medical): Not on file  . Lack of Transportation (Non-Medical): Not on file  Physical Activity:   . Days of Exercise per Week: Not on file  . Minutes of Exercise per Session: Not on file  Stress:   . Feeling of Stress : Not on file  Social Connections:   . Frequency of Communication with Friends and Family: Not on file  . Frequency of Social Gatherings with Friends and Family: Not on file  . Attends Religious Services: Not on file  . Active Member of Clubs or Organizations: Not on file  . Attends Archivist Meetings: Not on file  . Marital Status: Not on file    Allergies:  Allergies  Allergen Reactions  . Penicillins Rash    Metabolic Disorder Labs: No results found for: HGBA1C, MPG No results found for: PROLACTIN Lab Results  Component Value Date   CHOL 94 (L) 06/11/2018   TRIG 61 06/11/2018   HDL 43 06/11/2018   LDLCALC 39 06/11/2018   Lab Results  Component Value Date   TSH 0.968 06/11/2018   TSH 0.995 03/01/2017    Therapeutic Level Labs: No results found for: LITHIUM No results found for: VALPROATE No components found for:  CBMZ  Current Medications: Current Outpatient Medications  Medication Sig Dispense Refill  . HYDROcodone-acetaminophen (NORCO) 5-325 MG tablet Take 1 tablet by mouth every 4 (four) hours as needed for moderate pain. 10 tablet 0  . LORazepam (ATIVAN) 0.5 MG tablet Take 1 tablet (0.5 mg total) by mouth as directed. To be taken once a day , up to 3  times a week for severe panic attacks 12 tablet 1  . ondansetron (ZOFRAN) 4 MG tablet Take 1 tablet (4 mg total) by mouth daily as needed. 14 tablet 0  . venlafaxine (EFFEXOR) 100 MG tablet Take 1 tablet (100 mg total) by mouth 2 (two) times daily. 30 tablet 1   No current facility-administered medications for this visit.     Musculoskeletal: Strength & Muscle Tone: UTA Gait & Station: normal Patient leans: N/A  Psychiatric Specialty Exam: Review of Systems  Psychiatric/Behavioral: Positive for dysphoric mood.  All other systems reviewed and are negative.   There were no vitals taken for this visit.There is no height or weight on file to calculate BMI.  General Appearance: Casual  Eye Contact:  Fair  Speech:  Normal Rate  Volume:  Normal  Mood:  Depressed  Affect:  Tearful  Thought Process:  Goal Directed and Descriptions of Associations: Intact  Orientation:  Full (Time, Place, and Person)  Thought Content: Logical   Suicidal Thoughts:  No  Homicidal Thoughts:  No  Memory:  Immediate;   Fair Recent;   Fair Remote;   Fair  Judgement:  Fair  Insight:  Fair  Psychomotor Activity:  Normal  Concentration:  Concentration: Fair and Attention Span: Fair  Recall:  [employer] of Knowledge: Fair  Language: Fair  Akathisia:  No  Handed:  Right  AIMS (if indicated): denies tremors, rigidity  Assets:  Communication Skills Desire for Improvement Housing Social Support  ADL's:  Intact  Cognition: WNL  Sleep:  Fair   Screenings: GAD-7     Office Visit from 02/13/2019 in [facility] Office Visit from 12/16/2018 in [facility] Visit from 07/09/2018 in [facility] Visit from 06/11/2018 in [facility] Visit from 01/29/2018 in [facility]  Total GAD-7 Score  18  18  3  17   0    PHQ2-9     Office Visit from 02/13/2019 in [facility] Office Visit from  12/16/2018 in [facility] Visit from 07/09/2018 in [facility] Visit from 06/11/2018 in [facility] Visit from 01/29/2018 in [facility]  PHQ-2 Total Score  3  4  0  3  0  PHQ-9 Total Score  13  19  3  14  1        Assessment and Plan: Alysen is a 29 year old Caucasian female, married, employed, lives in [place], has a history of depression, anxiety, bereavement was evaluated by telemedicine today.  She is biologically predisposed given her family history of mental health problems.  She also has psychosocial stressors of the death of her mother and her grandmother recently, husband's mental health problems.  Patient is currently struggling with depressive symptoms as well as relationship struggles with her husband.  She will benefit from medication readjustment and psychotherapy sessions.  Plan MDD-unstable Patient today reports she is not interested in increasing the dosage of Effexor back to 150 mg. Discussed changing her Effexor to a regular form-100 mg p.o. daily. She agrees with plan.  GAD-unstable Effexor 100 mg p.o. daily. Continue CBT, encourage patient to restart psychotherapy sessions with Ms. Miguel Dibble.  Social anxiety disorder-improving She will continue to work with therapist  Bereavement-unstable She has lorazepam 0.5 mg as needed available for severe anxiety attacks Also advised to continue psychotherapy sessions.  Patient also is going to start marriage counseling.  Follow-up in clinic in 3 to 4 weeks or sooner if needed.  February 1 at 4:30 PM  I have spent atleast 20 minutes non face to face with patient today. More than 50 % of the time was spent for ordering medications and test ,psychoeducation and supportive psychotherapy and care coordination,as well as documenting clinical information in electronic health record.  This note was generated in part or whole with voice recognition software. Voice  recognition is usually quite accurate but there are transcription errors that can and very often do occur. I apologize for any typographical errors that were not detected and corrected.       Gabrielle Alert, Jackson 07/09/2019, 6:12 PM

## 2019-07-12 ENCOUNTER — Other Ambulatory Visit: Payer: Self-pay | Admitting: Psychiatry

## 2019-07-12 DIAGNOSIS — F329 Major depressive disorder, single episode, unspecified: Secondary | ICD-10-CM

## 2019-07-12 DIAGNOSIS — F411 Generalized anxiety disorder: Secondary | ICD-10-CM

## 2019-07-12 DIAGNOSIS — F32A Depression, unspecified: Secondary | ICD-10-CM

## 2019-08-03 ENCOUNTER — Other Ambulatory Visit: Payer: Self-pay

## 2019-08-03 ENCOUNTER — Encounter: Payer: Self-pay | Admitting: Psychiatry

## 2019-08-03 ENCOUNTER — Ambulatory Visit (INDEPENDENT_AMBULATORY_CARE_PROVIDER_SITE_OTHER): Payer: Commercial Managed Care - PPO | Admitting: Psychiatry

## 2019-08-03 DIAGNOSIS — F411 Generalized anxiety disorder: Secondary | ICD-10-CM | POA: Diagnosis not present

## 2019-08-03 DIAGNOSIS — F324 Major depressive disorder, single episode, in partial remission: Secondary | ICD-10-CM | POA: Diagnosis not present

## 2019-08-03 DIAGNOSIS — Z634 Disappearance and death of family member: Secondary | ICD-10-CM

## 2019-08-03 DIAGNOSIS — F4010 Social phobia, unspecified: Secondary | ICD-10-CM

## 2019-08-03 MED ORDER — LORAZEPAM 0.5 MG PO TABS: 0.5000 mg | ORAL_TABLET | ORAL | 1 refills | Status: DC

## 2019-08-03 NOTE — Progress Notes (Signed)
Provider Location : [facility] Patient Location : Home   Virtual Visit via Video Note  I connected with Gabrielle Jackson on 08/03/19 at  4:30 PM EST by a video enabled telemedicine application and verified that I am speaking with the correct person using two identifiers.   I discussed the limitations of evaluation and management by telemedicine and the availability of in person appointments. The patient expressed understanding and agreed to proceed.    I discussed the assessment and treatment plan with the patient. The patient was provided an opportunity to ask questions and all were answered. The patient agreed with the plan and demonstrated an understanding of the instructions.   The patient was advised to call back or seek an in-person evaluation if the symptoms worsen or if the condition fails to improve as anticipated.   [facility] MD OP Progress Note  08/03/2019 4:49 PM Gabrielle Jackson  MRN:  [id number]  Chief Complaint:  Chief Complaint    Follow-up     HPI: Gabrielle Jackson is a  29 year old Caucasian female, married, employed, lives in [place], has a history of GAD, MDD, social anxiety disorder, depression was evaluated by telemedicine today.  Patient today reports she is currently making progress with regards to her mood symptoms.  She denies any significant anxiety or depressive symptoms.  Patient reports sleep is good.  Patient denies any suicidality, homicidality or perceptual disturbances.  She continues to have relationship struggles with her husband and is currently working with her Animator.  She reports that has not made any difference in her relationship with her husband at this time.  He is very resistant to change.  She however has been making some changes herself.  Patient continues to work with Ms. Tina Thompson-individual therapist.  She reports therapy sessions is helpful.  She is compliant on medications as prescribed.  She denies side effects.  She  denies any other concerns today. Visit Diagnosis:    ICD-10-CM   1. GAD (generalized anxiety disorder)  F41.1 LORazepam (ATIVAN) 0.5 MG tablet  2. MDD (major depressive disorder), single episode, in partial remission ([facility])  F32.4   3. Social anxiety disorder  F40.10   4. Bereavement  Z63.4     Past Psychiatric History: I have reviewed past psychiatric history from my progress note on 02/13/2019.  Past trials of Effexor, lorazepam, BuSpar.  Past Medical History:  Past Medical History:  Diagnosis Date  . Breast mass 05/02/2010   lt breast pain  . Car sickness   . Dysmenorrhea   . Ovarian cyst   . Velamentous insertion of umbilical cord     Past Surgical History:  Procedure Laterality Date  . TYMPANOPLASTY Right 03/21/2018   Procedure: TYMPANOPLASTY WITH GRAFT;  Surgeon: Beverly Gust, MD;  Location: [facility];  Service: ENT;  Laterality: Right;    Family Psychiatric History: I have reviewed family psychiatric history from my progress note on 02/13/2019.  Family History:  Family History  Problem Relation Age of Onset  . Depression Mother   . Leukemia Mother   . Hypertension Father   . Depression Maternal Grandmother   . COPD Maternal Grandfather   . COPD Paternal Grandmother   . Lung cancer Paternal Grandmother     Social History: I have reviewed social history from my progress note on 02/13/2019. Social History   Socioeconomic History  . Marital status: Married    Spouse name: Not on file  . Number of children: 2  . Years of education:  Not on file  . Highest education level: Not on file  Occupational History  . Not on file  Tobacco Use  . Smoking status: Never Smoker  . Smokeless tobacco: Never Used  Substance and Sexual Activity  . Alcohol use: Yes    Alcohol/week: 0.0 standard drinks    Comment: On occasion  . Drug use: No  . Sexual activity: Yes    Birth control/protection: None, Condom  Other Topics Concern  . Not on file  Social History  Narrative  . Not on file   Social Determinants of Health   Financial Resource Strain:   . Difficulty of Paying Living Expenses: Not on file  Food Insecurity:   . Worried About Charity fundraiser in the Last Year: Not on file  . Ran Out of Food in the Last Year: Not on file  Transportation Needs:   . Lack of Transportation (Medical): Not on file  . Lack of Transportation (Non-Medical): Not on file  Physical Activity:   . Days of Exercise per Week: Not on file  . Minutes of Exercise per Session: Not on file  Stress:   . Feeling of Stress : Not on file  Social Connections:   . Frequency of Communication with Friends and Family: Not on file  . Frequency of Social Gatherings with Friends and Family: Not on file  . Attends Religious Services: Not on file  . Active Member of Clubs or Organizations: Not on file  . Attends Archivist Meetings: Not on file  . Marital Status: Not on file    Allergies:  Allergies  Allergen Reactions  . Penicillins Rash    Metabolic Disorder Labs: No results found for: HGBA1C, MPG No results found for: PROLACTIN Lab Results  Component Value Date   CHOL 94 (L) 06/11/2018   TRIG 61 06/11/2018   HDL 43 06/11/2018   LDLCALC 39 06/11/2018   Lab Results  Component Value Date   TSH 0.968 06/11/2018   TSH 0.995 03/01/2017    Therapeutic Level Labs: No results found for: LITHIUM No results found for: VALPROATE No components found for:  CBMZ  Current Medications: Current Outpatient Medications  Medication Sig Dispense Refill  . HYDROcodone-acetaminophen (NORCO) 5-325 MG tablet Take 1 tablet by mouth every 4 (four) hours as needed for moderate pain. 10 tablet 0  . [START ON 08/13/2019] LORazepam (ATIVAN) 0.5 MG tablet Take 1 tablet (0.5 mg total) by mouth as directed. To be taken once a day , up to 1-2 times a week for severe panic attacks 10 tablet 1  . ondansetron (ZOFRAN) 4 MG tablet Take 1 tablet (4 mg total) by mouth daily as needed.  14 tablet 0  . venlafaxine (EFFEXOR) 100 MG tablet Take 1 tablet (100 mg total) by mouth 2 (two) times daily. 30 tablet 1   No current facility-administered medications for this visit.     Musculoskeletal: Strength & Muscle Tone: UTA Gait & Station: normal Patient leans: N/A  Psychiatric Specialty Exam: Review of Systems  Psychiatric/Behavioral: Negative for agitation, behavioral problems, confusion, decreased concentration, dysphoric mood, hallucinations, self-injury, sleep disturbance and suicidal ideas. The patient is not nervous/anxious and is not hyperactive.   All other systems reviewed and are negative.   There were no vitals taken for this visit.There is no height or weight on file to calculate BMI.  General Appearance: Casual  Eye Contact:  Fair  Speech:  Clear and Coherent  Volume:  Normal  Mood:  Euthymic  Affect:  Appropriate  Thought Process:  Goal Directed and Descriptions of Associations: Intact  Orientation:  Full (Time, Place, and Person)  Thought Content: Logical   Suicidal Thoughts:  No  Homicidal Thoughts:  No  Memory:  Immediate;   Fair Recent;   Fair Remote;   Fair  Judgement:  Fair  Insight:  Fair  Psychomotor Activity:  Normal  Concentration:  Concentration: Fair and Attention Span: Fair  Recall:  [employer] of Knowledge: Fair  Language: Fair  Akathisia:  No  Handed:  Right  AIMS (if indicated): denies tremors,rigidity  Assets:  Communication Skills Desire for Improvement Housing Social Support  ADL's:  Intact  Cognition: WNL  Sleep:  Fair   Screenings: GAD-7     Office Visit from 02/13/2019 in [facility] Office Visit from 12/16/2018 in [facility] Visit from 07/09/2018 in [facility] Visit from 06/11/2018 in [facility] Visit from 01/29/2018 in [facility]  Total GAD-7 Score  18  18  3  17   0    PHQ2-9     Office Visit from 02/13/2019 in  [facility] Office Visit from 12/16/2018 in [facility] Visit from 07/09/2018 in [facility] Visit from 06/11/2018 in [facility] Visit from 01/29/2018 in [facility]  PHQ-2 Total Score  3  4  0  3  0  PHQ-9 Total Score  13  19  3  14  1        Assessment and Plan: Gabrielle Jackson is a 29 year old Caucasian female, married, employed, lives in [place], has a history of depression, anxiety, bereavement was evaluated by telemedicine today.  She is biologically predisposed given her family history of mental health problems.  She also has psychosocial stressors of the death of her mother and her grandmother recently, husband's mental health problems and relationship struggles with him.  Patient is currently making progress.  She will continue to benefit from psychotherapy sessions as well as medication management.  Plan as noted below.  Plan MDD-improving Effexor 100 mg p.o. daily Continue CBT with Ms. Miguel Dibble  GAD-improving Effexor 100 mg p.o. daily  Social anxiety disorder-improving She will continue to work with her therapist  Bereavement-improving Lorazepam 0.5 mg as needed as she takes it once a week or so. I have reviewed [place] controlled substance database.  She will limit use.  Patient to continue family/marital counseling.  Follow-up in clinic in 6 to 8 weeks or sooner if needed.  March 30 at 9:40 AM  I have spent atleast 20 minutes non face to face with patient today. More than 50 % of the time was spent for  ordering medications and test ,psychoeducation and supportive psychotherapy and care coordination,as well as documenting clinical information in electronic health record. This note was generated in part or whole with voice recognition software. Voice recognition is usually quite accurate but there are transcription errors that can and very often do occur. I apologize for any typographical  errors that were not detected and corrected.      Ursula Alert, MD 08/03/2019, 4:49 PM

## 2019-09-07 ENCOUNTER — Telehealth: Payer: Self-pay

## 2019-09-07 DIAGNOSIS — F411 Generalized anxiety disorder: Secondary | ICD-10-CM

## 2019-09-07 DIAGNOSIS — F324 Major depressive disorder, single episode, in partial remission: Secondary | ICD-10-CM

## 2019-09-07 MED ORDER — VENLAFAXINE HCL 75 MG PO TABS: 75.0000 mg | ORAL_TABLET | Freq: Every day | ORAL | 1 refills | Status: DC

## 2019-09-07 NOTE — Telephone Encounter (Signed)
Patient called and requested a dosage change for her Venlafaxine 100mg . She would like it  to be reduced to 75mg . She uses [facility] [address] in [place]. Please review and advise. Thank you.

## 2019-09-07 NOTE — Telephone Encounter (Signed)
Attempted to contact patient, left voicemail.  Change venlafaxine to 75 mg p.o. daily-sent to pharmacy as requested.  Patient advised to call writer back.

## 2019-09-09 NOTE — Telephone Encounter (Signed)
Contacted and notified patient

## 2019-09-29 ENCOUNTER — Encounter: Payer: Self-pay | Admitting: Psychiatry

## 2019-09-29 ENCOUNTER — Ambulatory Visit (INDEPENDENT_AMBULATORY_CARE_PROVIDER_SITE_OTHER): Payer: Commercial Managed Care - PPO | Admitting: Psychiatry

## 2019-09-29 ENCOUNTER — Other Ambulatory Visit: Payer: Self-pay

## 2019-09-29 DIAGNOSIS — F411 Generalized anxiety disorder: Secondary | ICD-10-CM | POA: Diagnosis not present

## 2019-09-29 DIAGNOSIS — Z634 Disappearance and death of family member: Secondary | ICD-10-CM

## 2019-09-29 DIAGNOSIS — F324 Major depressive disorder, single episode, in partial remission: Secondary | ICD-10-CM | POA: Diagnosis not present

## 2019-09-29 DIAGNOSIS — F4010 Social phobia, unspecified: Secondary | ICD-10-CM | POA: Diagnosis not present

## 2019-09-29 MED ORDER — VENLAFAXINE HCL ER 75 MG PO CP24: 75.0000 mg | ORAL_CAPSULE | Freq: Every day | ORAL | 0 refills | Status: DC

## 2019-09-29 MED ORDER — TRAZODONE HCL 50 MG PO TABS: 25.0000 mg | ORAL_TABLET | Freq: Every evening | ORAL | 1 refills | Status: DC | PRN

## 2019-09-29 NOTE — Progress Notes (Signed)
Provider Location : [facility] Patient Location : Home  Virtual Visit via Video Note  I connected with [name] on 09/29/19 at  9:40 AM EDT by a video enabled telemedicine application and verified that I am speaking with the correct person using two identifiers.   I discussed the limitations of evaluation and management by telemedicine and the availability of in person appointments. The patient expressed understanding and agreed to proceed.     I discussed the assessment and treatment plan with the patient. The patient was provided an opportunity to ask questions and all were answered. The patient agreed with the plan and demonstrated an understanding of the instructions.   The patient was advised to call back or seek an in-person evaluation if the symptoms worsen or if the condition fails to improve as anticipated.   [facility] MD OP Progress Note  09/29/2019 11:16 AM JADA MAYABB  MRN:  [id number]  Chief Complaint:  Chief Complaint    Follow-up     HPI: Lyberti is a 29 year old Caucasian female, married, employed, lives in [place], has a history of GAD, MDD, social anxiety disorder, depression was evaluated by telemedicine today.  Patient today reports she is currently planning on separating from her current husband.  She reports they tried to do couples counseling but that did not work.  He is not ready to make any changes in their marriage.  She reports this has been going on for a while and she is tired of it.  She reports he also has alcoholism and a gambling problem.  She reports she is planning to move out sometime in May and is currently making arrangements for the same.  She has a lot of anxiety due to the upcoming separation.  She reports she is also having sleep issues.  She reports she would like to change her venlafaxine to an extended release.  She continues to take lorazepam as needed some nights and has been limiting use.  She continues to follow-up with her therapist  Ms. Miguel Dibble which is going well.  Patient denies any suicidality, homicidality or perceptual disturbances.   She has good social support system.  She reports she continues to work and work is going well.   Visit Diagnosis:    ICD-10-CM   1. GAD (generalized anxiety disorder)  F41.1 venlafaxine XR (EFFEXOR XR) 75 MG 24 hr capsule  2. MDD (major depressive disorder), single episode, in partial remission (HCC)  F32.4 venlafaxine XR (EFFEXOR XR) 75 MG 24 hr capsule    traZODone (DESYREL) 50 MG tablet  3. Social anxiety disorder  F40.10 venlafaxine XR (EFFEXOR XR) 75 MG 24 hr capsule  4. Bereavement  Z63.4     Past Psychiatric History: I have reviewed past psychiatric history from my progress note on 02/13/2019.  Past trials of Effexor, lorazepam, BuSpar  Past Medical History:  Past Medical History:  Diagnosis Date  . Breast mass 05/02/2010   lt breast pain  . Car sickness   . Dysmenorrhea   . Ovarian cyst   . Velamentous insertion of umbilical cord     Past Surgical History:  Procedure Laterality Date  . TYMPANOPLASTY Right 03/21/2018   Procedure: TYMPANOPLASTY WITH GRAFT;  Surgeon: Beverly Gust, MD;  Location: [facility];  Service: ENT;  Laterality: Right;    Family Psychiatric History: I have reviewed family psychiatric history from my progress note on 02/13/2019. Family History:  Family History  Problem Relation Age of Onset  . Depression Mother   .  Leukemia Mother   . Hypertension Father   . Depression Maternal Grandmother   . COPD Maternal Grandfather   . COPD Paternal Grandmother   . Lung cancer Paternal Grandmother     Social History: I have reviewed social history from my progress note on 02/13/2019 Social History   Socioeconomic History  . Marital status: Married    Spouse name: Not on file  . Number of children: 2  . Years of education: Not on file  . Highest education level: Not on file  Occupational History  . Not on file  Tobacco Use   . Smoking status: Never Smoker  . Smokeless tobacco: Never Used  Substance and Sexual Activity  . Alcohol use: Yes    Alcohol/week: 0.0 standard drinks    Comment: On occasion  . Drug use: No  . Sexual activity: Yes    Birth control/protection: None, Condom  Other Topics Concern  . Not on file  Social History Narrative  . Not on file   Social Determinants of Health   Financial Resource Strain:   . Difficulty of Paying Living Expenses:   Food Insecurity:   . Worried About Charity fundraiser in the Last Year:   . Arboriculturist in the Last Year:   Transportation Needs:   . Film/video editor (Medical):   Marland Kitchen Lack of Transportation (Non-Medical):   Physical Activity:   . Days of Exercise per Week:   . Minutes of Exercise per Session:   Stress:   . Feeling of Stress :   Social Connections:   . Frequency of Communication with Friends and Family:   . Frequency of Social Gatherings with Friends and Family:   . Attends Religious Services:   . Active Member of Clubs or Organizations:   . Attends Archivist Meetings:   Marland Kitchen Marital Status:     Allergies:  Allergies  Allergen Reactions  . Penicillins Rash    Metabolic Disorder Labs: No results found for: HGBA1C, MPG No results found for: PROLACTIN Lab Results  Component Value Date   CHOL 94 (L) 06/11/2018   TRIG 61 06/11/2018   HDL 43 06/11/2018   LDLCALC 39 06/11/2018   Lab Results  Component Value Date   TSH 0.968 06/11/2018   TSH 0.995 03/01/2017    Therapeutic Level Labs: No results found for: LITHIUM No results found for: VALPROATE No components found for:  CBMZ  Current Medications: Current Outpatient Medications  Medication Sig Dispense Refill  . LORazepam (ATIVAN) 0.5 MG tablet Take 1 tablet (0.5 mg total) by mouth as directed. To be taken once a day , up to 1-2 times a week for severe panic attacks 10 tablet 1  . ondansetron (ZOFRAN) 4 MG tablet Take 1 tablet (4 mg total) by mouth daily  as needed. 14 tablet 0  . traZODone (DESYREL) 50 MG tablet Take 0.5-1 tablets (25-50 mg total) by mouth at bedtime as needed for sleep. 30 tablet 1  . venlafaxine XR (EFFEXOR XR) 75 MG 24 hr capsule Take 1 capsule (75 mg total) by mouth daily with breakfast. 90 capsule 0   No current facility-administered medications for this visit.     Musculoskeletal: Strength & Muscle Tone: UTA Gait & Station: normal Patient leans: N/A  Psychiatric Specialty Exam: Review of Systems  Psychiatric/Behavioral: Positive for sleep disturbance. The patient is nervous/anxious.   All other systems reviewed and are negative.   There were no vitals taken for this visit.There is  no height or weight on file to calculate BMI.  General Appearance: Casual  Eye Contact:  Fair  Speech:  Clear and Coherent  Volume:  Normal  Mood:  Anxious  Affect:  Congruent  Thought Process:  Goal Directed and Descriptions of Associations: Intact  Orientation:  Full (Time, Place, and Person)  Thought Content: Logical   Suicidal Thoughts:  No  Homicidal Thoughts:  No  Memory:  Immediate;   Fair Recent;   Fair Remote;   Fair  Judgement:  Fair  Insight:  Fair  Psychomotor Activity:  Normal  Concentration:  Concentration: Fair and Attention Span: Fair  Recall:  [employer] of Knowledge: Fair  Language: Fair  Akathisia:  No  Handed:  Right  AIMS (if indicated): UTA  Assets:  Communication Skills Desire for [place] Talents/Skills Transportation Vocational/Educational  ADL's:  Intact  Cognition: WNL  Sleep:  Poor   Screenings: GAD-7     Office Visit from 02/13/2019 in [facility] Office Visit from 12/16/2018 in [facility] Visit from 07/09/2018 in [facility] Visit from 06/11/2018 in [facility] Visit from 01/29/2018 in [facility]  Total GAD-7 Score  18  18  3  17   0    PHQ2-9     Office  Visit from 02/13/2019 in [facility] Office Visit from 12/16/2018 in [facility] Visit from 07/09/2018 in [facility] Visit from 06/11/2018 in [facility] Visit from 01/29/2018 in [facility]  PHQ-2 Total Score  3  4  0  3  0  PHQ-9 Total Score  13  19  3  14  1        Assessment and Plan: Hollynd is a 29 year old Caucasian female, married, employed, lives in [place], has a history of depression, anxiety, bereavement was evaluated by telemedicine today.  She is biologically predisposed given her family history of mental health problems.  She also has psychosocial stressors of the death of her mother, grandmother, husband's mental health and relationship struggles with him.  She is currently planning on separating from her husband which is making her anxiety symptoms worse and also affecting her sleep.  She will benefit from the following plan.  Plan MDD-in partial remission Change venlafaxine to extended release 75 mg p.o. daily due to patient preference. Continue CBT with Ms. Miguel Dibble.  GAD-unstable Venlafaxine as prescribed Add trazodone 25 to 50 mg p.o. nightly for sleep.  She reports anxiety symptoms causing sleep issues.  Lorazepam 0.5 mg as needed for significant anxiety symptoms only. She will limit use. Continue CBT  Social anxiety disorder-improving She will continue to follow-up with her therapist  Bereavement-improving Continue CBT.  Follow-up in clinic in 4 weeks or sooner if needed.  I have spent atleast 20 minutes non face to face with patient today. More than 50 % of the time was spent for preparing to see the patient ( e.g., review of test, records ),ordering medications and test ,psychoeducation and supportive psychotherapy and care coordination,as well as documenting clinical information in electronic health record. This note was generated in part or whole with voice  recognition software. Voice recognition is usually quite accurate but there are transcription errors that can and very often do occur. I apologize for any typographical errors that were not detected and corrected.         Ursula Alert, MD 09/29/2019, 11:16 AM

## 2019-10-28 ENCOUNTER — Telehealth (INDEPENDENT_AMBULATORY_CARE_PROVIDER_SITE_OTHER): Payer: Commercial Managed Care - PPO | Admitting: Psychiatry

## 2019-10-28 ENCOUNTER — Other Ambulatory Visit: Payer: Self-pay

## 2019-10-28 DIAGNOSIS — Z5329 Procedure and treatment not carried out because of patient's decision for other reasons: Secondary | ICD-10-CM

## 2019-10-28 NOTE — Progress Notes (Signed)
No response to call or text. 

## 2019-11-17 ENCOUNTER — Ambulatory Visit (INDEPENDENT_AMBULATORY_CARE_PROVIDER_SITE_OTHER): Payer: Commercial Managed Care - PPO

## 2019-11-17 ENCOUNTER — Encounter: Payer: Self-pay | Admitting: Obstetrics and Gynecology

## 2019-11-17 ENCOUNTER — Ambulatory Visit (INDEPENDENT_AMBULATORY_CARE_PROVIDER_SITE_OTHER): Payer: Commercial Managed Care - PPO | Admitting: Obstetrics and Gynecology

## 2019-11-17 ENCOUNTER — Other Ambulatory Visit: Payer: Self-pay

## 2019-11-17 ENCOUNTER — Other Ambulatory Visit (HOSPITAL_COMMUNITY)
Admission: RE | Admit: 2019-11-17 | Discharge: 2019-11-17 | Disposition: A | Discharge status: Home / Self Care | Payer: Commercial Managed Care - PPO | Source: Ambulatory Visit | Attending: Obstetrics and Gynecology | Admitting: Obstetrics and Gynecology

## 2019-11-17 VITALS — BP 120/90 | Ht 68.0 in | Wt 161.0 lb

## 2019-11-17 DIAGNOSIS — Z113 Encounter for screening for infections with a predominantly sexual mode of transmission: Secondary | ICD-10-CM | POA: Diagnosis present

## 2019-11-17 DIAGNOSIS — R1031 Right lower quadrant pain: Secondary | ICD-10-CM | POA: Diagnosis not present

## 2019-11-17 LAB — POCT URINE PREGNANCY: Preg Test, Ur: NEGATIVE

## 2019-11-17 NOTE — Progress Notes (Signed)
Valerie Roys, DO   Chief Complaint  Patient presents with  . Pelvic Pain    right side only, denies uti sx    HPI:      [name] is a 29 y.o. [id number] whose LMP was Patient's last menstrual period was 11/05/2019 (exact date)., presents today for constant, dull RLQ pain for 3 days. Taking tylenol with some relief. Is affecting sleep. Also with decreased appetite, n/v, not related to severity of pain. No diarrhea/constipation/LBP/fevers. Still has appendix. Had 1 day light spotting with pain initially. No urin sx, no vag sx. Hx of ovar cysts in past, most recently noted to have ruptured RT ovar cyst on 12/20 abd/pelvic for LLQ pain. Declined BC in in past to prevent cysts.   She is sex active, has new partner. Not using condoms. Doing withdrawal. Had neg UPT a couple wks ago.   Menses monthly, last 3 days but last cycle was shorter than usual. Usually no BTB.    Past Medical History:  Diagnosis Date  . Breast mass 05/02/2010   lt breast pain  . Car sickness   . Dysmenorrhea   . Ovarian cyst   . Velamentous insertion of umbilical cord     Past Surgical History:  Procedure Laterality Date  . TYMPANOPLASTY Right 03/21/2018   Procedure: TYMPANOPLASTY WITH GRAFT;  Surgeon: Beverly Gust, MD;  Location: [facility];  Service: ENT;  Laterality: Right;    Family History  Problem Relation Age of Onset  . Depression Mother   . Leukemia Mother   . Hypertension Father   . Depression Maternal Grandmother   . COPD Maternal Grandfather   . COPD Paternal Grandmother   . Lung cancer Paternal Grandmother     Social History   Socioeconomic History  . Marital status: Married    Spouse name: Not on file  . Number of children: 2  . Years of education: Not on file  . Highest education level: Not on file  Occupational History  . Not on file  Tobacco Use  . Smoking status: Never Smoker  . Smokeless tobacco: Never Used  Substance and Sexual Activity  .  Alcohol use: Yes    Alcohol/week: 0.0 standard drinks    Comment: On occasion  . Drug use: No  . Sexual activity: Yes    Birth control/protection: None, Condom  Other Topics Concern  . Not on file  Social History Narrative  . Not on file   Social Determinants of Health   Financial Resource Strain:   . Difficulty of Paying Living Expenses:   Food Insecurity:   . Worried About Charity fundraiser in the Last Year:   . Arboriculturist in the Last Year:   Transportation Needs:   . Film/video editor (Medical):   Marland Kitchen Lack of Transportation (Non-Medical):   Physical Activity:   . Days of Exercise per Week:   . Minutes of Exercise per Session:   Stress:   . Feeling of Stress :   Social Connections:   . Frequency of Communication with Friends and Family:   . Frequency of Social Gatherings with Friends and Family:   . Attends Religious Services:   . Active Member of Clubs or Organizations:   . Attends Archivist Meetings:   Marland Kitchen Marital Status:   Intimate Partner Violence:   . Fear of Current or Ex-Partner:   . Emotionally Abused:   Marland Kitchen Physically Abused:   .  Sexually Abused:     Outpatient Medications Prior to Visit  Medication Sig Dispense Refill  . venlafaxine XR (EFFEXOR XR) 75 MG 24 hr capsule Take 1 capsule (75 mg total) by mouth daily with breakfast. 90 capsule 0  . LORazepam (ATIVAN) 0.5 MG tablet Take 1 tablet (0.5 mg total) by mouth as directed. To be taken once a day , up to 1-2 times a week for severe panic attacks 10 tablet 1  . ondansetron (ZOFRAN) 4 MG tablet Take 1 tablet (4 mg total) by mouth daily as needed. 14 tablet 0  . traZODone (DESYREL) 50 MG tablet Take 0.5-1 tablets (25-50 mg total) by mouth at bedtime as needed for sleep. 30 tablet 1   No facility-administered medications prior to visit.      ROS:  Review of Systems  Constitutional: Negative for fever.  Gastrointestinal: Positive for nausea and vomiting. Negative for blood in stool,  constipation and diarrhea.  Genitourinary: Positive for pelvic pain. Negative for dyspareunia, dysuria, flank pain, frequency, hematuria, urgency, vaginal bleeding, vaginal discharge and vaginal pain.  Musculoskeletal: Negative for back pain.  Skin: Negative for rash.   OBJECTIVE:   Vitals:  BP 120/90   Ht 5\' 8"  (1.727 m)   Wt 161 lb (73 kg)   LMP 11/05/2019 (Exact Date)   BMI 24.48 kg/m   Physical Exam Vitals reviewed.  Constitutional:      Appearance: She is well-developed.  Pulmonary:     Effort: Pulmonary effort is normal.  Abdominal:     Palpations: Abdomen is soft.     Tenderness: There is abdominal tenderness in the right lower quadrant. There is no guarding or rebound.  Genitourinary:    General: Normal vulva.     Pubic Area: No rash.      Labia:        Right: No rash, tenderness or lesion.        Left: No rash, tenderness or lesion.      Vagina: Vaginal discharge present. No erythema or tenderness.     Cervix: Normal.     Uterus: Normal. Not enlarged and not tender.      Adnexa: Left adnexa normal.       Right: Tenderness present. No mass.         Left: No mass or tenderness.       Comments: +/- CMT RLQ  Musculoskeletal:        General: Normal range of motion.     Cervical back: Normal range of motion.  Skin:    General: Skin is warm and dry.  Neurological:     General: No focal deficit present.     Mental Status: She is alert and oriented to person, place, and time.  Psychiatric:        Mood and Affect: Mood normal.        Behavior: Behavior normal.        Thought Content: Thought content normal.        Judgment: Judgment normal.     Results: Results for orders placed or performed in visit on 11/17/19 (from the past 24 hour(s))  POCT urine pregnancy     Status: Normal   Collection Time: 11/17/19  2:44 PM  Result Value Ref Range   Preg Test, Ur Negative Negative     Assessment/Plan: RLQ abdominal pain - Plan: POCT urine pregnancy, US PELVIS  TRANSVAGINAL NON-OB (TV ONLY); For 3 days, with hx of ovar cysts in past. Neg UPT. Check GYN  u/s, rule out STDs. If u/s neg, will empirically treat for PID.   Screening for STD (sexually transmitted disease) - Plan: Cervicovaginal ancillary only    Meds ordered this encounter  Medications  . doxycycline (VIBRAMYCIN) 100 MG capsule    Sig: Take 1 capsule (100 mg total) by mouth 2 (two) times daily.    Dispense:  14 capsule    Refill:  0    Order Specific Question:   Supervising Provider    Answer:   Gae Dry [id number]  . cefTRIAXone (ROCEPHIN) injection 250 mg      Return for 4:30 GYN u/s--[facility] to call pt.  Jese Comella B. Cayley Pester, PA-C 11/18/2019 11:00 AM

## 2019-11-17 NOTE — Patient Instructions (Signed)
I value your feedback and entrusting us with your care. If you get a [facility] patient survey, I would appreciate you taking the time to let us know about your experience today. Thank you!  As of June 11, 2019, your lab results will be released to your MyChart immediately, before I even have a chance to see them. Please give me time to review them and contact you if there are any abnormalities. Thank you for your patience.  

## 2019-11-18 ENCOUNTER — Telehealth: Payer: Self-pay | Admitting: Obstetrics and Gynecology

## 2019-11-18 ENCOUNTER — Ambulatory Visit: Payer: Commercial Managed Care - PPO

## 2019-11-18 MED ORDER — CEFTRIAXONE SODIUM 250 MG IJ SOLR
250.0000 mg | Freq: Once | INTRAMUSCULAR | Status: AC
  Administered 2019-11-19: 250 mg via INTRAMUSCULAR

## 2019-11-18 MED ORDER — DOXYCYCLINE HYCLATE 100 MG PO CAPS: 100.0000 mg | ORAL_CAPSULE | Freq: Two times a day (BID) | ORAL | 0 refills | Status: DC

## 2019-11-18 NOTE — Telephone Encounter (Signed)
Pt aware of neg GYN u/s results for RLQ pain. Given sx and slight CMT, will treat empirically for PID. Waiting on STD testing results. Rx doxy BID for 7 days (while waiting for results) and RTO with RN for rocephin 250 mg IM. F/u prn. No change in sx today from yesterday.

## 2019-11-19 ENCOUNTER — Ambulatory Visit: Payer: Commercial Managed Care - PPO

## 2019-11-19 ENCOUNTER — Other Ambulatory Visit: Payer: Self-pay

## 2019-11-19 ENCOUNTER — Ambulatory Visit (INDEPENDENT_AMBULATORY_CARE_PROVIDER_SITE_OTHER): Payer: Commercial Managed Care - PPO

## 2019-11-19 DIAGNOSIS — R1031 Right lower quadrant pain: Secondary | ICD-10-CM

## 2019-11-19 LAB — CERVICOVAGINAL ANCILLARY ONLY
Chlamydia: NEGATIVE
Comment: NEGATIVE
Comment: NORMAL
Neisseria Gonorrhea: NEGATIVE

## 2019-11-19 NOTE — Progress Notes (Signed)
Patient presents today for Rocephin injection. Given IM LUOQ. Patient tolerated well.

## 2019-11-25 ENCOUNTER — Encounter: Payer: Self-pay | Admitting: Obstetrics

## 2019-11-25 ENCOUNTER — Other Ambulatory Visit: Payer: Self-pay

## 2019-11-25 ENCOUNTER — Ambulatory Visit (INDEPENDENT_AMBULATORY_CARE_PROVIDER_SITE_OTHER): Payer: Commercial Managed Care - PPO | Admitting: Obstetrics

## 2019-11-25 VITALS — BP 120/80 | Wt 159.0 lb

## 2019-11-25 DIAGNOSIS — N912 Amenorrhea, unspecified: Secondary | ICD-10-CM

## 2019-11-25 DIAGNOSIS — Z3202 Encounter for pregnancy test, result negative: Secondary | ICD-10-CM

## 2019-11-25 LAB — POCT URINE PREGNANCY: Preg Test, Ur: NEGATIVE

## 2019-11-25 NOTE — Progress Notes (Signed)
Obstetrics & Gynecology Office Visit   Chief Complaint:  Chief Complaint  Patient presents with  . Initial Prenatal Visit    History of Present Illness: Pt here for confirmation of pregnancy. Reports LMP of 5/11/05/2019. She was seen in the office approx a week ago for right sided pain, and treated presumptively for GC. Her testing was negative. Today, Jessical reports feeling nauseous for the last 3-5 days. She is also very tired. She denies any fever or exposure to anyone with COVID.  This morning she took a pregnancy test and it was positive x 2. She denies any vaginal bleeding.Regarding the possibility of a pregnancy, she expresses ambivalence- and shares that she does not know what she would do- "it is not a good time" for a pregnancy.  She has declined contraception at previous visits to the office, using "withdrawal" for [facility]. In the past she has used the [employer], the Mirena, and OCPs. She liked the [employer] most, but shares she is somewhat sensitive to hormonal BC, and takes Effexor for some mood "irritability".   Review of Systems:  ROS - unremarkable ROSx 12 with the exception of systems listed below.  Past Medical History:  Past Medical History:  Diagnosis Date  . Breast mass 05/02/2010   lt breast pain  . Car sickness   . Dysmenorrhea   . Ovarian cyst   . Velamentous insertion of umbilical cord     Past Surgical History:  Past Surgical History:  Procedure Laterality Date  . TYMPANOPLASTY Right 03/21/2018   Procedure: TYMPANOPLASTY WITH GRAFT;  Surgeon: Beverly Gust, MD;  Location: [facility];  Service: ENT;  Laterality: Right;    Gynecologic History: Patient's last menstrual period was 11/05/2019.  Obstetric History: [id number]  Family History:  Family History  Problem Relation Age of Onset  . Depression Mother   . Leukemia Mother   . Hypertension Father   . Depression Maternal Grandmother   . COPD Maternal Grandfather   . COPD Paternal Grandmother    . Lung cancer Paternal Grandmother     Social History:  Social History   Socioeconomic History  . Marital status: Divorced- partnered    Spouse name: Not on file  . Number of children: 2  . Years of education: Not on file  . Highest education level: Not on file  Occupational History  . Not on file  Tobacco Use  . Smoking status: Never Smoker  . Smokeless tobacco: Never Used  Substance and Sexual Activity  . Alcohol use: Yes    Alcohol/week: 0.0 standard drinks    Comment: On occasion  . Drug use: No  . Sexual activity: Yes    Birth control/protection: None, Condom  Other Topics Concern  . Not on file  Social History Narrative  . Not on file   Social Determinants of Health   Financial Resource Strain:   . Difficulty of Paying Living Expenses:   Food Insecurity:   . Worried About Charity fundraiser in the Last Year:   . Arboriculturist in the Last Year:   Transportation Needs:   . Film/video editor (Medical):   Marland Kitchen Lack of Transportation (Non-Medical):   Physical Activity:   . Days of Exercise per Week:   . Minutes of Exercise per Session:   Stress:   . Feeling of Stress :   Social Connections:   . Frequency of Communication with Friends and Family:   . Frequency of Social Gatherings  with Friends and Family:   . Attends Religious Services:   . Active Member of Clubs or Organizations:   . Attends Archivist Meetings:   Marland Kitchen Marital Status:   Intimate Partner Violence:   . Fear of Current or Ex-Partner:   . Emotionally Abused:   Marland Kitchen Physically Abused:   . Sexually Abused:     Allergies:  Allergies  Allergen Reactions  . Penicillins Rash    Medications: Prior to Admission medications   Medication Sig Start Date End Date Taking? Authorizing Provider  venlafaxine XR (EFFEXOR XR) 75 MG 24 hr capsule Take 1 capsule (75 mg total) by mouth daily with breakfast. 09/29/19  Yes Ursula Alert, MD    Physical Exam Vitals:  Vitals:   11/25/19 1451   BP: 120/80   Patient's last menstrual period was 11/05/2019.  Physical Exam  Deferred  Pregnancy test (POCT urine) is NEGATIVE today.   Assessment: 29 y.o. [id number]  Amenorrhea. Requesting quantitative HCG testing.  Possible early pregnancy.   Plan: Problem List Items Addressed This Visit    None    Visit Diagnoses    Pregnancy examination or test, negative result    -  Primary   Relevant Orders   POCT urine pregnancy (Completed)   Beta hCG quant (ref lab)   Amenorrhea       Relevant Orders   Beta hCG quant (ref lab)     Plan: We discussed drawing a quantitative HCG today, and repeating it in 2 days depending on today's results. Per her LMP, she would be [redacted] weeks Gestation. Quant HCG drawn today. She will RTC in 2 days for another Quant HCG. Should her testing be negative for a pregnancy, Jwana would like to discuss and  start on Contraception.   Imagene Riches, CNM  11/25/2019 3:48 PM

## 2019-11-26 ENCOUNTER — Telehealth: Payer: Self-pay | Admitting: Obstetrics

## 2019-11-26 NOTE — Telephone Encounter (Signed)
Patient is calling for labs results. Please advise. 

## 2019-11-27 ENCOUNTER — Other Ambulatory Visit: Payer: Self-pay

## 2019-11-27 ENCOUNTER — Encounter: Payer: Self-pay | Admitting: Certified Nurse Midwife

## 2019-11-27 ENCOUNTER — Ambulatory Visit (INDEPENDENT_AMBULATORY_CARE_PROVIDER_SITE_OTHER): Payer: Commercial Managed Care - PPO | Admitting: Certified Nurse Midwife

## 2019-11-27 VITALS — BP 113/72 | Ht 68.0 in | Wt 158.6 lb

## 2019-11-27 DIAGNOSIS — Z30015 Encounter for initial prescription of vaginal ring hormonal contraceptive: Secondary | ICD-10-CM

## 2019-11-27 MED ORDER — ETONOGESTREL-ETHINYL ESTRADIOL 0.12-0.015 MG/24HR VA RING: VAGINAL_RING | VAGINAL | 5 refills | Status: DC

## 2019-11-27 NOTE — Telephone Encounter (Signed)
Seen by Della Goo today for f/u on +home pregnancy test with negative office pregnancy tests. A second Hcg is drawn today. The first result is pending. She has decided to use the [employer]. Please see note for the 11/27/2019 encounter. Imagene Riches, CNM  11/27/2019 3:55 PM

## 2019-11-27 NOTE — Telephone Encounter (Signed)
Patient called. Results still pending. Will check again tomorrow. Dalia Heading, CNM

## 2019-11-27 NOTE — Progress Notes (Signed)
Obstetrics & Gynecology Office Visit   Chief Complaint:  Chief Complaint  Patient presents with  . Gynecologic Exam    History of Present Illness: Gabrielle Jackson is a 29 year old G2 P2002 with LMP 11/05/2019 returns for her beta HCG results and to discuss contraception. She presented 2 days ago with a positive home pregnancy test, but the UPT in office was negative. The beta HCG she had drawn at that time is not resulted yet. She was interested in either the [place] or the Mirena IUD if she is not pregnant. She has used both these methods in the past. We reviewed pros and cons of both these methods and things she liked or disliked about these methods. She has no history of migraines with aura, hepatitis or other liver problems, smoking or any thromboembolic disorder.  Current form of contraception is condoms.  Review of Systems:  ROS   Past Medical History:  Past Medical History:  Diagnosis Date  . Anxiety disorder   . Breast mass 05/02/2010   lt breast pain  . Car sickness   . Depression   . Dysmenorrhea   . Ovarian cyst   . Velamentous insertion of umbilical cord     Past Surgical History:  Past Surgical History:  Procedure Laterality Date  . TYMPANOPLASTY Right 03/21/2018   Procedure: TYMPANOPLASTY WITH GRAFT;  Surgeon: Beverly Gust, MD;  Location: [facility];  Service: ENT;  Laterality: Right;    Gynecologic History: Patient's last menstrual period was 11/05/2019.  Obstetric History: [id number]  Family History:  Family History  Problem Relation Age of Onset  . Depression Mother   . Leukemia Mother   . Hypertension Father   . Depression Maternal Grandmother   . COPD Maternal Grandfather   . COPD Paternal Grandmother   . Lung cancer Paternal Grandmother     Social History:  Social History   Socioeconomic History  . Marital status: Married    Spouse name: Not on file  . Number of children: 2  . Years of education: Not on file  . Highest  education level: Not on file  Occupational History  . Not on file  Tobacco Use  . Smoking status: Never Smoker  . Smokeless tobacco: Never Used  Substance and Sexual Activity  . Alcohol use: Yes    Alcohol/week: 0.0 standard drinks    Comment: On occasion  . Drug use: No  . Sexual activity: Yes    Birth control/protection: None, Condom  Other Topics Concern  . Not on file  Social History Narrative  . Not on file   Social Determinants of Health   Financial Resource Strain:   . Difficulty of Paying Living Expenses:   Food Insecurity:   . Worried About Charity fundraiser in the Last Year:   . Arboriculturist in the Last Year:   Transportation Needs:   . Film/video editor (Medical):   Marland Kitchen Lack of Transportation (Non-Medical):   Physical Activity:   . Days of Exercise per Week:   . Minutes of Exercise per Session:   Stress:   . Feeling of Stress :   Social Connections:   . Frequency of Communication with Friends and Family:   . Frequency of Social Gatherings with Friends and Family:   . Attends Religious Services:   . Active Member of Clubs or Organizations:   . Attends Archivist Meetings:   Marland Kitchen Marital Status:   Intimate Partner Violence:   .  Fear of Current or Ex-Partner:   . Emotionally Abused:   Marland Kitchen Physically Abused:   . Sexually Abused:     Allergies:  Allergies  Allergen Reactions  . Penicillins Rash    Medications: Prior to Admission medications   Medication Sig Start Date End Date Taking? Authorizing Provider  venlafaxine XR (EFFEXOR XR) 75 MG 24 hr capsule Take 1 capsule (75 mg total) by mouth daily with breakfast. 09/29/19  Yes Ursula Alert, MD  Physical Exam Vitals:  Vitals:   11/27/19 1001  BP: 113/72   Patient's last menstrual period was 11/05/2019.  Physical Exam  Constitutional: She is oriented to person, place, and time. She appears well-developed and well-nourished. No distress.  Neurological: She is alert and oriented to  person, place, and time.  Psychiatric: She has a normal mood and affect.     Assessment: 29 y.o. [id number] for contraceptive counseling  Plan: Plans Nuvaring if not pregnant. Will call with beta HCG results. RX Nuvaring #1 RF x 5 Reviewed how to insert and when to remove  Nuvaring, possible side effects and risks of thromboembolism. Can start use after her next menses starts (same day or Sunday start). To continue condoms until completes first week of first ring.  Follow up at annual in October and prn.   Dalia Heading, CNM

## 2019-11-27 NOTE — Telephone Encounter (Signed)
Patient is calling to follow up on her HCG labs. Please advise MMF not in office

## 2019-12-01 ENCOUNTER — Encounter: Payer: Self-pay | Admitting: Obstetrics

## 2019-12-01 LAB — BETA HCG QUANT (REF LAB): hCG Quant: <1 m[IU]/mL

## 2019-12-01 NOTE — Progress Notes (Signed)
Quant level is less than 1-patient notified via email of the results . She is encouraged to set up an appointment for Contraception.  Imagene Riches, CNM  12/01/2019 9:04 AM

## 2019-12-01 NOTE — Telephone Encounter (Signed)
Spoke to Patient and I made her aware of her Beta HCG is negative.

## 2019-12-08 ENCOUNTER — Telehealth: Payer: Self-pay | Admitting: Obstetrics and Gynecology

## 2019-12-08 NOTE — Telephone Encounter (Signed)
Noted. Mirena reserved for this patient. 

## 2019-12-08 NOTE — Telephone Encounter (Signed)
Patient is scheduled for 12/09/19 with ABC at 8:50 for Mirena placement

## 2019-12-08 NOTE — Progress Notes (Signed)
   Chief Complaint  Patient presents with  . Contraception    Mirena insertion     IUD PROCEDURE NOTE:  Gabrielle Jackson is a 29 y.o. [phone] here for Mirena  IUD insertion for [facility]. Had one PP in past and did fine. Was considering nuvaring too. Recently treated for presumptive PID with neg STD testing with rocephin and doxy for pelvic pain. Sx resolved.   BP 90/70   Ht 5\' 8"  (1.727 m)   Wt 155 lb (70.3 kg)   LMP 12/06/2019 (Exact Date)   BMI 23.57 kg/m   IUD Insertion Procedure Note Patient identified, informed consent performed, consent signed.   Discussed risks of irregular bleeding, cramping, infection, malpositioning or misplacement of the IUD outside the uterus which may require further procedure such as laparoscopy, risk of failure <1%. Time out was performed.    Speculum placed in the vagina.  Cervix visualized.  Cleaned with Betadine x 2.  Grasped anteriorly with a single tooth tenaculum.  Uterus sounded to 7.0 cm.   IUD placed per manufacturer's recommendations.  Strings trimmed to 3 cm. Tenaculum was removed, good hemostasis noted.  Patient tolerated procedure well.   ASSESSMENT:  Encounter for IUD insertion - Plan: levonorgestrel (MIRENA, 52 MG,) 20 MCG/24HR IUD   Meds ordered this encounter  Medications  . levonorgestrel (MIRENA, 52 MG,) 20 MCG/24HR IUD    Sig: 1 Intra Uterine Device (1 each total) by Intrauterine route once for 1 dose.    Dispense:  1 Intra Uterine Device    Refill:  0    Order Specific Question:   Supervising Provider    Answer:   Gabrielle Jackson [[id number]]     Plan:  Patient was given post-procedure instructions.  She was advised to have backup contraception for one week.   Call if you are having increasing pain, cramps or bleeding or if you have a fever greater than 100.4 degrees F., shaking chills, nausea or vomiting. Patient was also asked to check IUD strings periodically and follow up in 4 weeks for IUD check.  Return in about 4  weeks (around 01/06/2020) for IUD f/u.  Gabrielle Jackson B. Gabrielle Johansson, PA-C 12/09/2019 9:18 AM

## 2019-12-08 NOTE — Patient Instructions (Addendum)
I value your feedback and entrusting us with your care. If you get a [facility] patient survey, I would appreciate you taking the time to let us know about your experience today. Thank you!  As of June 11, 2019, your lab results will be released to your MyChart immediately, before I even have a chance to see them. Please give me time to review them and contact you if there are any abnormalities. Thank you for your patience.   [facility] [phone]  Instructions after IUD insertion  Most women experience no significant problems after insertion of an IUD, however minor cramping and spotting for a few days is common. Cramps may be treated with ibuprofen 800mg every 8 hours or Tylenol 650 mg every 4 hours. Contact [facility] immediately if you experience any of the following symptoms during the next week: temperature >99.6 degrees, worsening pelvic pain, abdominal pain, fainting, unusually heavy vaginal bleeding, foul vaginal discharge, or if you think you have expelled the IUD.  Nothing inserted in the vagina for 48 hours. You will be scheduled for a follow up visit in approximately four weeks.  You should check monthly to be sure you can feel the IUD strings in the upper vagina. If you are having a monthly period, try to check after each period. If you cannot feel the IUD strings,  contact [facility] immediately so we can do an exam to determine if the IUD has been expelled.   Please use backup protection until we can confirm the IUD is in place.  Call [facility] if you are exposed to or diagnosed with a sexually transmitted infection, as we will need to discuss whether it is safe for you to continue using an IUD.   

## 2019-12-09 ENCOUNTER — Encounter: Payer: Self-pay | Admitting: Obstetrics and Gynecology

## 2019-12-09 ENCOUNTER — Other Ambulatory Visit: Payer: Self-pay

## 2019-12-09 ENCOUNTER — Ambulatory Visit (INDEPENDENT_AMBULATORY_CARE_PROVIDER_SITE_OTHER): Payer: Commercial Managed Care - PPO | Admitting: Obstetrics and Gynecology

## 2019-12-09 VITALS — BP 90/70 | Ht 68.0 in | Wt 155.0 lb

## 2019-12-09 DIAGNOSIS — Z30430 Encounter for insertion of intrauterine contraceptive device: Secondary | ICD-10-CM | POA: Diagnosis not present

## 2019-12-09 MED ORDER — MIRENA (52 MG) 20 MCG/24HR IU IUD: 1.0000 | INTRAUTERINE_SYSTEM | Freq: Once | INTRAUTERINE | 0 refills | Status: DC

## 2019-12-18 ENCOUNTER — Telehealth: Payer: Self-pay

## 2019-12-18 NOTE — Telephone Encounter (Signed)
Please advise 

## 2019-12-18 NOTE — Telephone Encounter (Signed)
Pls call pt to schedule appt. ABC will call with results.

## 2019-12-18 NOTE — Telephone Encounter (Signed)
Pt calling; mirena inserted a little over a week ago; knows she would spot and cramp; has been having super bad bleeding and cramping every day esp after sex.  [phone]

## 2019-12-18 NOTE — Telephone Encounter (Signed)
Pls get pt sched for GYN u/s for IUD placement. Will call with results. Ibup 600 mg 2-3 times daily in meantime for symptoms of cramping and bleeding, heating pad.

## 2019-12-18 NOTE — Telephone Encounter (Signed)
Scheduled

## 2019-12-21 ENCOUNTER — Other Ambulatory Visit: Payer: Self-pay

## 2019-12-21 ENCOUNTER — Other Ambulatory Visit (INDEPENDENT_AMBULATORY_CARE_PROVIDER_SITE_OTHER): Payer: Commercial Managed Care - PPO

## 2019-12-21 ENCOUNTER — Other Ambulatory Visit: Payer: Self-pay | Admitting: Obstetrics and Gynecology

## 2019-12-21 DIAGNOSIS — Z30431 Encounter for routine checking of intrauterine contraceptive device: Secondary | ICD-10-CM | POA: Diagnosis not present

## 2019-12-21 NOTE — Telephone Encounter (Signed)
Pt aware of neg GYN u/s for BTB with IUD and dysmen. IUD in correct location. Bleeding after sex and with cramping. Can either remove IUD or give it more time. Mirena Inserted 12/09/19. Pt will give it more time. F/u prn.

## 2020-01-05 NOTE — Progress Notes (Deleted)
PCP:  Valerie Roys, DO   No chief complaint on file.    HPI:      [name] is a 29 y.o. [phone] who LMP was No LMP recorded., presents today for her annual examination.  Her menses are regular every 28-30 days, lasting 5-7 days.  Dysmenorrhea mild, occurring first 1-2 days of flow. She does not have intermenstrual bleeding. Hx of ovar cysts in past, last one 3/20. No recent sx.   Sex activity: single partner, contraception - Mirena placed 12/09/19 Last Pap: 10/23/17  Results were: no abnormalities  Hx of STDs: none  There is no FH of breast cancer. There is no FH of ovarian cancer. The patient does do self-breast exams. Has noticed LT breast outer quadrant tenderness for a couple months without prior trauma, erythema, masses. Drinks a lot of caffeine daily.   Tobacco use: The patient denies current or previous tobacco use. Alcohol use: none No drug use.  Exercise: mod active  She does get adequate calcium but not Vitamin D in her diet.   Past Medical History:  Diagnosis Date  . Anxiety disorder   . Breast mass 05/02/2010   lt breast pain  . Car sickness   . Depression   . Dysmenorrhea   . Ovarian cyst   . Velamentous insertion of umbilical cord     Past Surgical History:  Procedure Laterality Date  . TYMPANOPLASTY Right 03/21/2018   Procedure: TYMPANOPLASTY WITH GRAFT;  Surgeon: Beverly Gust, MD;  Location: [facility];  Service: ENT;  Laterality: Right;    Family History  Problem Relation Age of Onset  . Depression Mother   . Leukemia Mother   . Hypertension Father   . Depression Maternal Grandmother   . COPD Maternal Grandfather   . COPD Paternal Grandmother   . Lung cancer Paternal Grandmother     Social History   Socioeconomic History  . Marital status: Married    Spouse name: Not on file  . Number of children: 2  . Years of education: Not on file  . Highest education level: Not on file  Occupational History  . Not on  file  Tobacco Use  . Smoking status: Never Smoker  . Smokeless tobacco: Never Used  Vaping Use  . Vaping Use: Never used  Substance and Sexual Activity  . Alcohol use: Yes    Alcohol/week: 0.0 standard drinks    Comment: On occasion  . Drug use: No  . Sexual activity: Yes    Birth control/protection: None, Condom  Other Topics Concern  . Not on file  Social History Narrative  . Not on file   Social Determinants of Health   Financial Resource Strain:   . Difficulty of Paying Living Expenses:   Food Insecurity:   . Worried About Charity fundraiser in the Last Year:   . Arboriculturist in the Last Year:   Transportation Needs:   . Film/video editor (Medical):   Marland Kitchen Lack of Transportation (Non-Medical):   Physical Activity:   . Days of Exercise per Week:   . Minutes of Exercise per Session:   Stress:   . Feeling of Stress :   Social Connections:   . Frequency of Communication with Friends and Family:   . Frequency of Social Gatherings with Friends and Family:   . Attends Religious Services:   . Active Member of Clubs or Organizations:   . Attends Archivist Meetings:   .  Marital Status:   Intimate Partner Violence:   . Fear of Current or Ex-Partner:   . Emotionally Abused:   Marland Kitchen Physically Abused:   . Sexually Abused:     Outpatient Medications Prior to Visit  Medication Sig Dispense Refill  . etonogestrel-ethinyl estradiol (NUVARING) 0.12-0.015 MG/24HR vaginal ring Insert vaginally and leave in place for 3 consecutive weeks, then remove for 1 week. (Patient not taking: Reported on 12/09/2019) 1 each 5  . levonorgestrel (MIRENA, 52 MG,) 20 MCG/24HR IUD 1 Intra Uterine Device (1 each total) by Intrauterine route once for 1 dose. 1 Intra Uterine Device 0  . venlafaxine XR (EFFEXOR XR) 75 MG 24 hr capsule Take 1 capsule (75 mg total) by mouth daily with breakfast. 90 capsule 0   No facility-administered medications prior to visit.    ROS:  Review of  Systems  Constitutional: Negative for fatigue, fever and unexpected weight change.  Respiratory: Negative for cough, shortness of breath and wheezing.   Cardiovascular: Negative for chest pain, palpitations and leg swelling.  Gastrointestinal: Negative for blood in stool, constipation, diarrhea, nausea and vomiting.  Endocrine: Negative for cold intolerance, heat intolerance and polyuria.  Genitourinary: Negative for dyspareunia, dysuria, flank pain, frequency, genital sores, hematuria, menstrual problem, pelvic pain, urgency, vaginal bleeding, vaginal discharge and vaginal pain.  Musculoskeletal: Negative for back pain, joint swelling and myalgias.  Skin: Negative for rash.  Neurological: Negative for dizziness, syncope, light-headedness, numbness and headaches.  Hematological: Negative for adenopathy.  Psychiatric/Behavioral: Positive for agitation and dysphoric mood. Negative for confusion, sleep disturbance and suicidal ideas. The patient is not nervous/anxious.   BREAST: tenderness   Objective: There were no vitals taken for this visit.   Physical Exam Constitutional:      Appearance: She is well-developed.  Genitourinary:     Vulva, vagina, uterus and left adnexa normal.     No vulval lesion or tenderness noted.     No vaginal discharge, erythema or tenderness.     No cervical motion tenderness or polyp.     Uterus is not enlarged or tender.     No right or left adnexal mass present.     Right adnexa tender.     Left adnexa not tender.  Neck:     Thyroid: No thyromegaly.  Cardiovascular:     Rate and Rhythm: Normal rate and regular rhythm.     Heart sounds: Normal heart sounds. No murmur heard.   Pulmonary:     Effort: Pulmonary effort is normal.     Breath sounds: Normal breath sounds.  Chest:     Breasts:        Right: No mass, nipple discharge, skin change or tenderness.        Left: Tenderness present. No mass, nipple discharge or skin change.     Comments: NEG  BREAST EXAM FOR MASSES Abdominal:     Palpations: Abdomen is soft. Abdomen is not rigid.     Tenderness: There is no abdominal tenderness. There is no guarding.  Musculoskeletal:        General: Normal range of motion.     Cervical back: Normal range of motion.  Neurological:     General: No focal deficit present.     Mental Status: She is alert and oriented to person, place, and time.     Cranial Nerves: No cranial nerve deficit.  Skin:    General: Skin is warm and dry.  Psychiatric:        Mood and  Affect: Mood normal.        Behavior: Behavior normal.        Thought Content: Thought content normal.        Judgment: Judgment normal.  Vitals reviewed.    Assessment/Plan: Encounter for annual routine gynecological examination  Breast tenderness--neg breast exam. D/C caffeine. F/u prn.         GYN counsel adequate intake of calcium and vitamin D, diet and exercise     F/U  No follow-ups on file./ 1 yr annual  [place] B. Ernisha Sorn, PA-C 01/05/2020 7:15 PM

## 2020-01-06 ENCOUNTER — Ambulatory Visit: Payer: Commercial Managed Care - PPO | Admitting: Obstetrics and Gynecology

## 2020-01-22 IMAGING — CT CT TEMPORAL BONES W/O CM
3 of 6 series · 15 of 40 positions shown, 18 images · non-contrast
Comparison: None.

CLINICAL DATA: Tympanic membrane perforation on the right.

EXAM:
CT TEMPORAL BONES WITHOUT CONTRAST
TECHNIQUE: Axial and coronal plane CT imaging of the petrous temporal bones was
performed with thin-collimation image reconstruction. No intravenous
contrast was administered. Multiplanar CT image reconstructions were
also generated.

[Series 3: ax soft temperal bones · axial · 0.33mm/px · z∈[-554,-532]mm · 2 of 33 slices shown]
[im 11/33  brain]
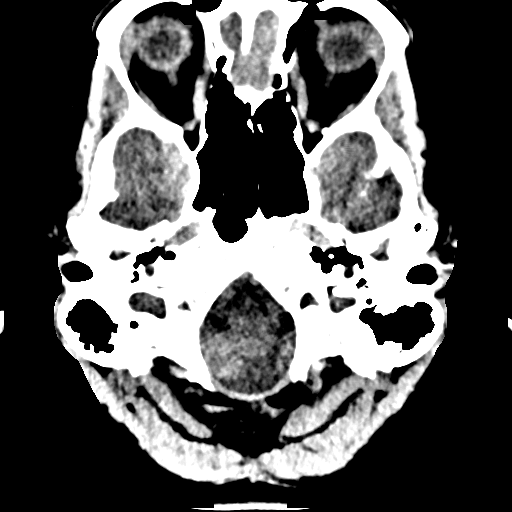
[im 22/33  brain]
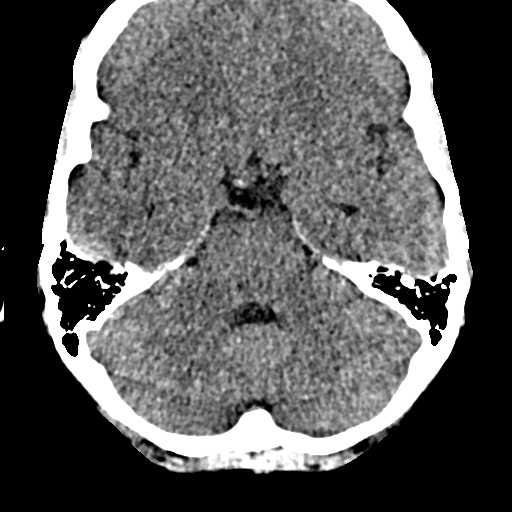

[Series 8: ax mag right temperal bones · axial · 0.20mm/px · z∈[-569,-515]mm · 11 of 110 slices shown, 14 images]
[im 10/110  brain]
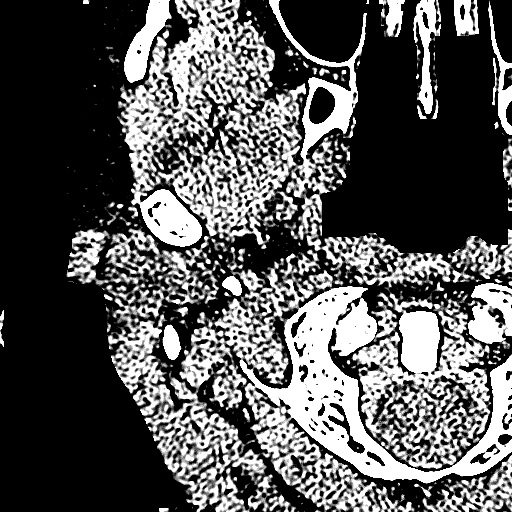
[im 10/110  bone]
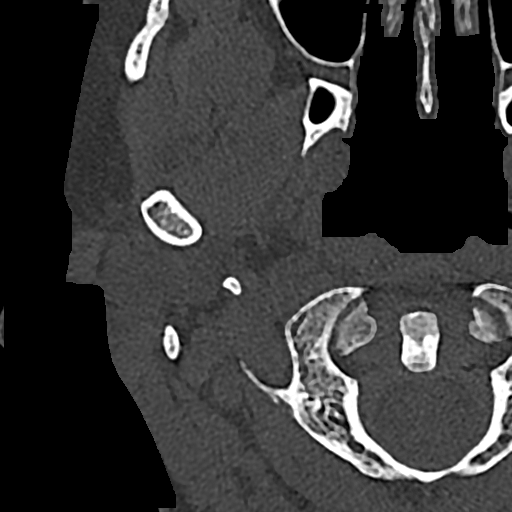
[im 19/110  bone]
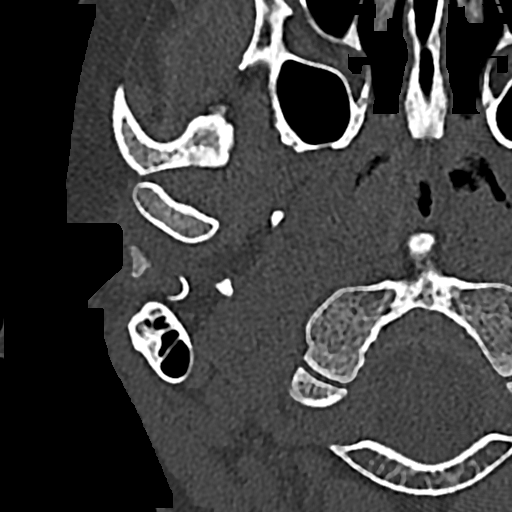
[im 28/110  bone]
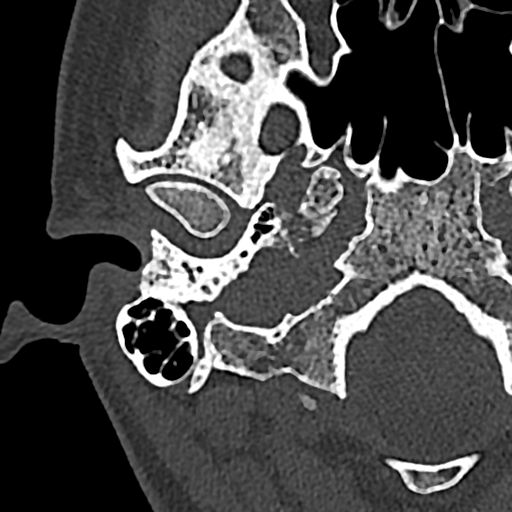
[im 37/110  bone]
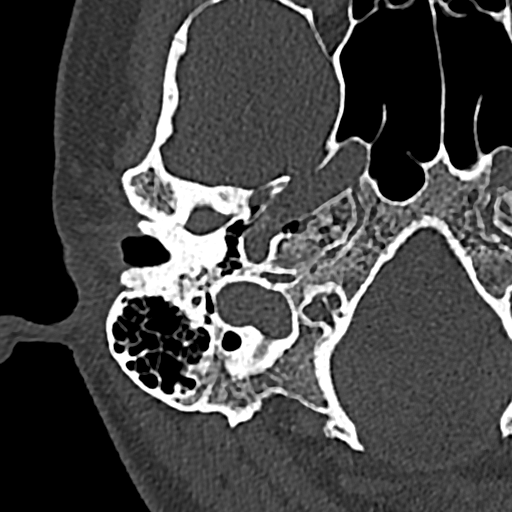
[im 46/110  brain]
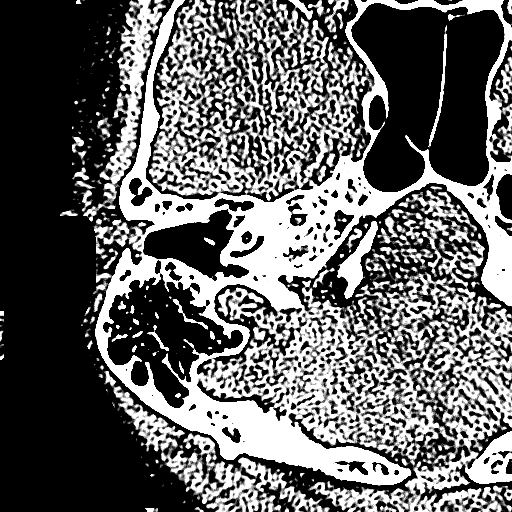
[im 46/110  bone]
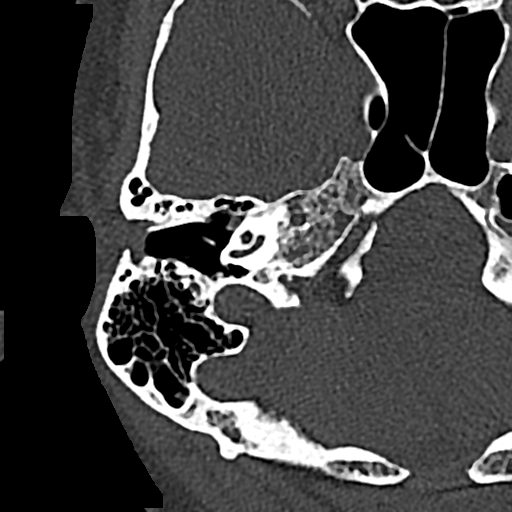
[im 55/110  bone]
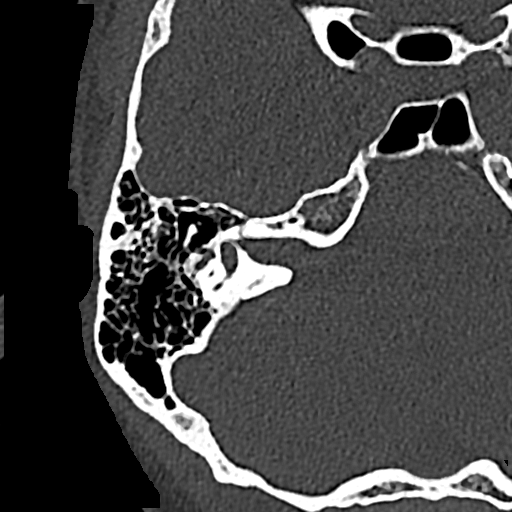
[im 64/110  bone]
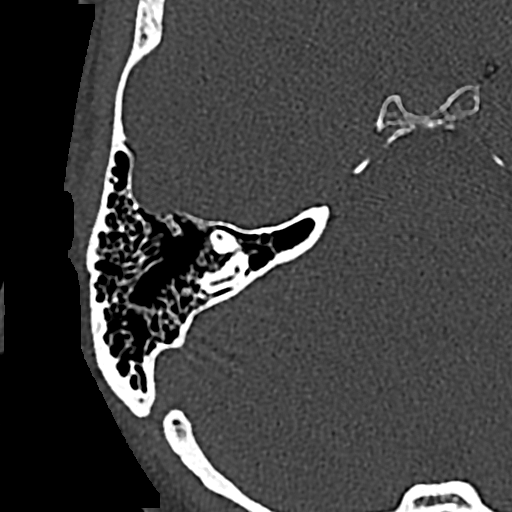
[im 73/110  bone]
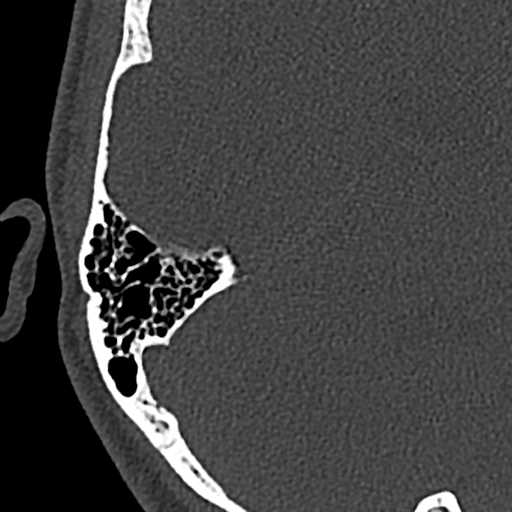
[im 82/110  brain]
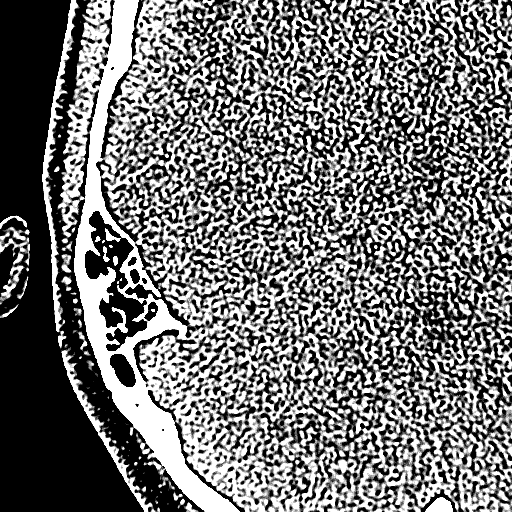
[im 82/110  bone]
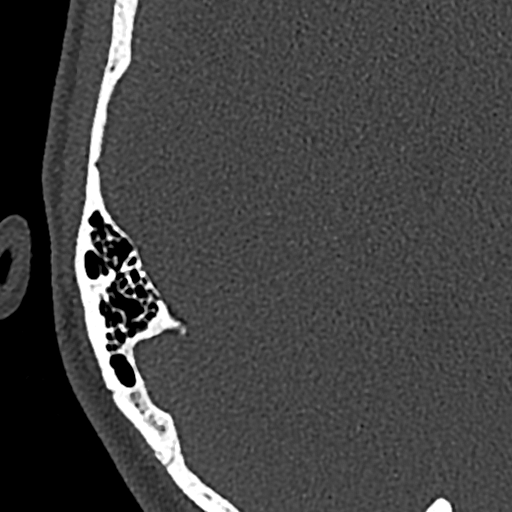
[im 91/110  bone]
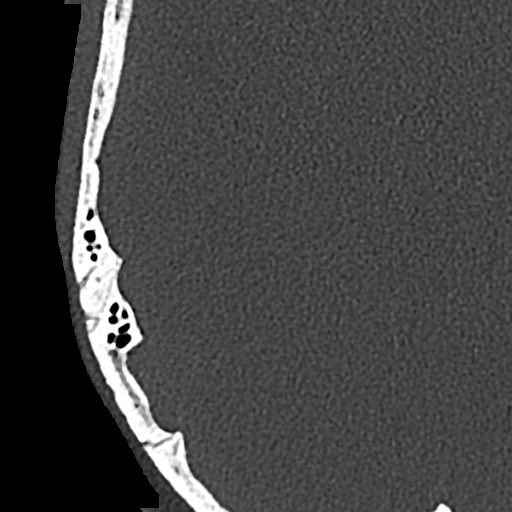
[im 100/110  bone]
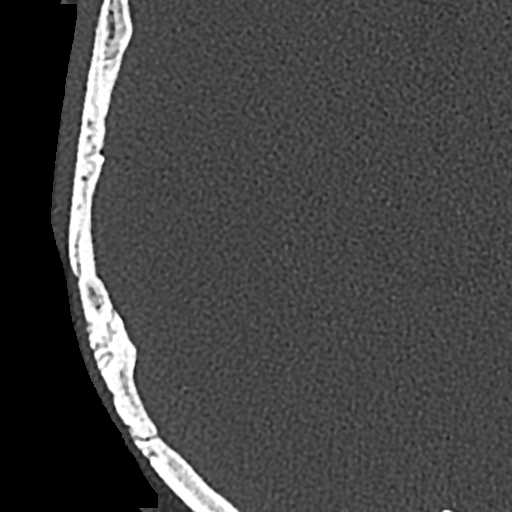

[Series 10: cor mag right temperal bones · coronal · 0.13mm/px · 2 of 125 slices shown]
[im 42/125  bone]
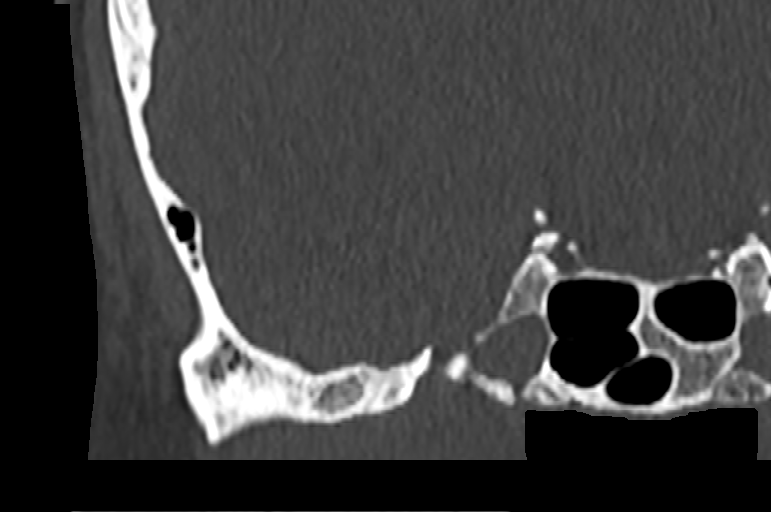
[im 83/125  bone]
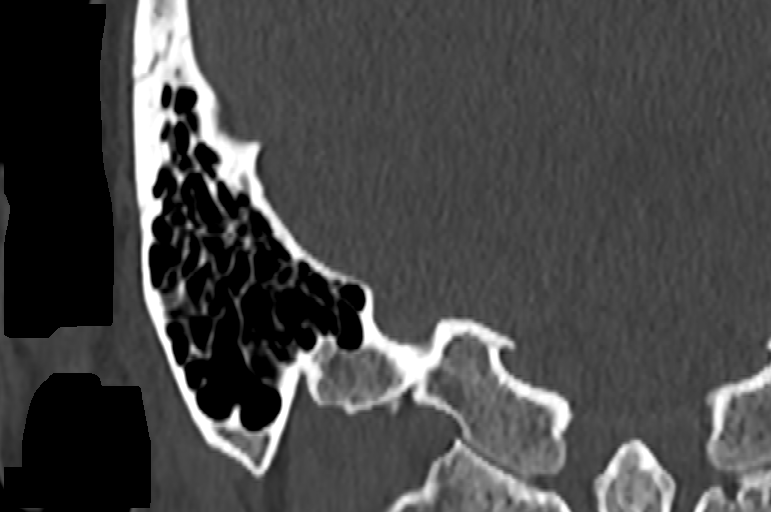

[15 of 40 positions shown; findings below may reference images not displayed]

FINDINGS: Right temporal bone: The Helder Caetano and external auditory canal are
unremarkable. The visible tympanic membrane is thin. The ossicles
are normally formed and aligned. Mastoid and middle ear is well
pneumatized and aerated. The labyrinthine structures appear normally
formed and bony covered. Internal auditory canal is normal in size.
The vestibular aqueduct is normal in size. The carotid and sigmoid
sinus are bone covered. Unremarkable facial nerve canal.

Left temporal bone: The Helder Caetano and external auditory canal are
unremarkable. The tympanic membrane is thin. The ossicles are
normally formed and aligned. Mastoid and middle ear is well
pneumatized and aerated. The labyrinthine structures appear normally
formed and bony covered. Internal auditory canal is normal in size.
The vestibular aqueduct is normal in size. The carotid and sigmoid
sinus are bone covered. Unremarkable facial nerve canal.
IMPRESSION: Negative temporal bones CT.

## 2020-02-03 ENCOUNTER — Ambulatory Visit (INDEPENDENT_AMBULATORY_CARE_PROVIDER_SITE_OTHER): Payer: Commercial Managed Care - PPO | Admitting: Obstetrics and Gynecology

## 2020-02-03 ENCOUNTER — Other Ambulatory Visit: Payer: Self-pay

## 2020-02-03 ENCOUNTER — Encounter: Payer: Self-pay | Admitting: Obstetrics and Gynecology

## 2020-02-03 VITALS — BP 122/74 | Ht 68.0 in | Wt 156.0 lb

## 2020-02-03 DIAGNOSIS — T8332XA Displacement of intrauterine contraceptive device, initial encounter: Secondary | ICD-10-CM

## 2020-02-03 NOTE — Progress Notes (Signed)
Obstetrics & Gynecology Office Visit    Chief Complaint  Patient presents with  . Follow-up  IUD concern  History of Present Illness: 29 y.o. G46P2002 female who had a Mirena IUD inserted 12/09/19.  She has routinely checked for her strings.  About 2 or 3 weeks ago she noted that she could not find the strings.  She does not recall seeing her IUD come out in the bathroom or in her underwear.  She did have an episode of cramping and bleeding sometime ago.  However, she did not see an IUD come out.  She has no other complaints at this time.   Past Medical History:  Diagnosis Date  . Anxiety disorder   . Breast mass 05/02/2010   lt breast pain  . Car sickness   . Depression   . Dysmenorrhea   . Ovarian cyst   . Velamentous insertion of umbilical cord     Past Surgical History:  Procedure Laterality Date  . TYMPANOPLASTY Right 03/21/2018   Procedure: TYMPANOPLASTY WITH GRAFT;  Surgeon: Beverly Gust, MD;  Location: [place];  Service: ENT;  Laterality: Right;    Gynecologic History: Patient's last menstrual period was 01/03/2020.  Obstetric History: [id number]  Family History  Problem Relation Age of Onset  . Depression Mother   . Leukemia Mother   . Hypertension Father   . Depression Maternal Grandmother   . COPD Maternal Grandfather   . COPD Paternal Grandmother   . Lung cancer Paternal Grandmother     Social History   Socioeconomic History  . Marital status: Married    Spouse name: Not on file  . Number of children: 2  . Years of education: Not on file  . Highest education level: Not on file  Occupational History  . Not on file  Tobacco Use  . Smoking status: Never Smoker  . Smokeless tobacco: Never Used  Vaping Use  . Vaping Use: Never used  Substance and Sexual Activity  . Alcohol use: Yes    Alcohol/week: 0.0 standard drinks    Comment: On occasion  . Drug use: No  . Sexual activity: Yes    Birth control/protection: None, Condom  Other  Topics Concern  . Not on file  Social History Narrative  . Not on file   Social Determinants of Health   Financial Resource Strain:   . Difficulty of Paying Living Expenses:   Food Insecurity:   . Worried About Charity fundraiser in the Last Year:   . Arboriculturist in the Last Year:   Transportation Needs:   . Film/video editor (Medical):   Marland Kitchen Lack of Transportation (Non-Medical):   Physical Activity:   . Days of Exercise per Week:   . Minutes of Exercise per Session:   Stress:   . Feeling of Stress :   Social Connections:   . Frequency of Communication with Friends and Family:   . Frequency of Social Gatherings with Friends and Family:   . Attends Religious Services:   . Active Member of Clubs or Organizations:   . Attends Archivist Meetings:   Marland Kitchen Marital Status:   Intimate Partner Violence:   . Fear of Current or Ex-Partner:   . Emotionally Abused:   Marland Kitchen Physically Abused:   . Sexually Abused:     Allergies  Allergen Reactions  . Penicillins Rash    Prior to Admission medications   Medication Sig Start Date End Date Taking? Authorizing Provider  etonogestrel-ethinyl estradiol (NUVARING) 0.12-0.015 MG/24HR vaginal ring Insert vaginally and leave in place for 3 consecutive weeks, then remove for 1 week. Patient not taking: Reported on 12/09/2019 11/27/19   Dalia Heading, CNM  levonorgestrel (MIRENA, 52 MG,) 20 MCG/24HR IUD 1 Intra Uterine Device (1 each total) by Intrauterine route once for 1 dose. 12/09/19 01/31/94  Copland, Deirdre Evener, PA-C  venlafaxine XR (EFFEXOR XR) 75 MG 24 hr capsule Take 1 capsule (75 mg total) by mouth daily with breakfast. 09/29/19   Ursula Alert, MD    Review of Systems  Constitutional: Negative.   HENT: Negative.   Eyes: Negative.   Respiratory: Negative.   Cardiovascular: Negative.   Gastrointestinal: Negative.   Genitourinary: Negative.   Musculoskeletal: Negative.   Skin: Negative.   Neurological: Negative.     Psychiatric/Behavioral: Negative.      Physical Exam BP 122/74   Ht 5\' 8"  (1.727 m)   Wt 156 lb (70.8 kg)   LMP 01/03/2020   BMI 23.72 kg/m  Patient's last menstrual period was 01/03/2020. Physical Exam Constitutional:      General: She is not in acute distress.    Appearance: Normal appearance. She is well-developed.  Genitourinary:     Pelvic exam was performed with patient in the lithotomy position.     Vulva, inguinal canal, urethra, bladder, vagina, uterus, right adnexa and left adnexa normal.     No posterior fourchette tenderness, injury or lesion present.     No cervical friability, lesion, bleeding or polyp.     No IUD strings visualized.  HENT:     Head: Normocephalic and atraumatic.  Eyes:     General: No scleral icterus.    Conjunctiva/sclera: Conjunctivae normal.  Cardiovascular:     Rate and Rhythm: Normal rate and regular rhythm.     Heart sounds: No murmur heard.  No friction rub. No gallop.   Pulmonary:     Effort: Pulmonary effort is normal. No respiratory distress.     Breath sounds: Normal breath sounds. No wheezing or rales.  Abdominal:     General: Bowel sounds are normal. There is no distension.     Palpations: Abdomen is soft. There is no mass.     Tenderness: There is no abdominal tenderness. There is no guarding or rebound.  Musculoskeletal:        General: Normal range of motion.     Cervical back: Normal range of motion and neck supple.  Neurological:     General: No focal deficit present.     Mental Status: She is alert and oriented to person, place, and time.     Cranial Nerves: No cranial nerve deficit.  Skin:    General: Skin is warm and dry.     Findings: No erythema.  Psychiatric:        Mood and Affect: Mood normal.        Behavior: Behavior normal.        Judgment: Judgment normal.   Bedside ultrasound shows no obvious IUD within the endometrial cavity.  However, exam limited by acoustics.  Female chaperone present for pelvic  and breast  portions of the physical exam  Assessment: 29 y.o. G89P2002 female here for  1. Intrauterine contraceptive device threads lost, initial encounter      Plan: Problem List Items Addressed This Visit    None    Visit Diagnoses    Intrauterine contraceptive device threads lost, initial encounter    -  Primary   Relevant  Orders   US PELVIS TRANSVAGINAL NON-OB (TV ONLY)     Will get an ultrasound soon as possible to verify presence or absence of the IUD.  If no IUD present, will get a KUB to ascertain whether the IUD is outside her body.  She was instructed to use backup contraception in the meantime since she has had what might have been unprotected intercourse in the last 2 to 3 weeks.  She would like to resume NuvaRing if she had her IUD the expelled and she is not pregnant.  A total of 20 minutes were spent face-to-face with the patient as well as preparation, review, communication, and documentation during this encounter.    Prentice Docker, MD 02/03/2020 2:15 PM

## 2020-02-04 ENCOUNTER — Ambulatory Visit: Payer: Commercial Managed Care - PPO

## 2020-02-08 ENCOUNTER — Ambulatory Visit: Payer: Commercial Managed Care - PPO | Admitting: Obstetrics and Gynecology

## 2020-02-09 ENCOUNTER — Telehealth: Payer: Self-pay | Admitting: Obstetrics and Gynecology

## 2020-02-09 ENCOUNTER — Ambulatory Visit (INDEPENDENT_AMBULATORY_CARE_PROVIDER_SITE_OTHER): Payer: Commercial Managed Care - PPO

## 2020-02-09 ENCOUNTER — Other Ambulatory Visit: Payer: Self-pay | Admitting: Obstetrics and Gynecology

## 2020-02-09 ENCOUNTER — Other Ambulatory Visit: Payer: Self-pay

## 2020-02-09 DIAGNOSIS — T8332XA Displacement of intrauterine contraceptive device, initial encounter: Secondary | ICD-10-CM | POA: Diagnosis not present

## 2020-02-09 NOTE — Telephone Encounter (Signed)
-----   Message from Will Bonnet, MD sent at 02/09/2020 11:15 AM EDT ----- Regarding: abdominal/pelvic xray Please see order for abdominal/pelvic xray. Should be done as soon as possible. Thanks!

## 2020-02-09 NOTE — Telephone Encounter (Signed)
Called pt to adv that Dr Glennon Mac had placed an order for her to have a abdominal/pelvic xray. Adv that she can go over to hospital and let them know that she has an order in the system. No appt required.

## 2020-02-09 NOTE — Telephone Encounter (Signed)
Discussed absence of IUD on ultrasound. Will get a flat plate of her abdomen/pelvis to verify the IUD is not in her abdomen and pelvis.  She states that she is 3 days late for her period. I asked her to go ahead and take a pregnancy. Test, if negative, give it another week or two (using back-up protection) and then take a pregnancy test again. If still negative, she could go ahead and start using the NuvaRing.

## 2020-02-16 ENCOUNTER — Ambulatory Visit (INDEPENDENT_AMBULATORY_CARE_PROVIDER_SITE_OTHER): Payer: Commercial Managed Care - PPO | Admitting: Nurse Practitioner

## 2020-02-16 ENCOUNTER — Encounter: Payer: Self-pay | Admitting: Nurse Practitioner

## 2020-02-16 ENCOUNTER — Other Ambulatory Visit: Payer: Self-pay

## 2020-02-16 VITALS — BP 106/69 | HR 86 | Temp 98.0°F | Ht 68.0 in | Wt 156.0 lb

## 2020-02-16 DIAGNOSIS — L03011 Cellulitis of right finger: Secondary | ICD-10-CM

## 2020-02-16 MED ORDER — DOXYCYCLINE HYCLATE 100 MG PO TABS: 100.0000 mg | ORAL_TABLET | Freq: Two times a day (BID) | ORAL | 0 refills | Status: DC

## 2020-02-16 NOTE — Progress Notes (Signed)
BP 106/69 (BP Location: Left Arm, Patient Position: Sitting, Cuff Size: Normal)   Pulse 86   Temp 98 F (36.7 C) (Oral)   Ht 5\' 8"  (1.727 m)   Wt 156 lb (70.8 kg)   SpO2 100%   BMI 23.72 kg/m    Subjective:    Patient ID: Gabrielle Jackson, female    DOB: [birth year], 29 y.o.   MRN: [id number]  HPI: Gabrielle Jackson is a 29 y.o. female presenting for skin issue.  Chief Complaint  Patient presents with  . Finger Problem    Right pinky finger, dropped tire on finger. Now swollen, red and painful.   SKIN INFECTION Patient reports trauma to her pinky finger on her right hand.  Since trauma, has had ongoing swelling, pain, and green/white drainage from nail bed. Duration: weeks Location: right pinky finger History of trauma in area: yes - dropped a tired and nail bent back Pain: yes Quality: throbbing Severity: moderate Redness: yes Swelling: yes Oozing: yes Pus: yes Fevers: no Nausea/vomiting: no Status: stable Treatments attempted: using alcohol and peroxide Tetanus: UTD - reports in 2018  Allergies  Allergen Reactions  . Penicillins Rash   Outpatient Encounter Medications as of 02/16/2020  Medication Sig  . venlafaxine XR (EFFEXOR XR) 75 MG 24 hr capsule Take 1 capsule (75 mg total) by mouth daily with breakfast.  . doxycycline (VIBRA-TABS) 100 MG tablet Take 1 tablet (100 mg total) by mouth 2 (two) times daily.  . [DISCONTINUED] etonogestrel-ethinyl estradiol (NUVARING) 0.12-0.015 MG/24HR vaginal ring Insert vaginally and leave in place for 3 consecutive weeks, then remove for 1 week. (Patient not taking: Reported on 12/09/2019)  . [DISCONTINUED] levonorgestrel (MIRENA, 52 MG,) 20 MCG/24HR IUD 1 Intra Uterine Device (1 each total) by Intrauterine route once for 1 dose.   No facility-administered encounter medications on file as of 02/16/2020.   Patient Active Problem List   Diagnosis Date Noted  . Cellulitis of finger of right hand 02/16/2020  . MDD (major  depressive disorder), single episode, in partial remission ([place]) 05/12/2019  . Social anxiety disorder 04/01/2019  . Bereavement 03/13/2019  . Depressive disorder 02/13/2019  . GAD (generalized anxiety disorder) 09/05/2015  . Partial hamstring tear 05/02/2015   Past Medical History:  Diagnosis Date  . Anxiety disorder   . Breast mass 05/02/2010   lt breast pain  . Car sickness   . Depression   . Dysmenorrhea   . Ovarian cyst   . Velamentous insertion of umbilical cord    Relevant past medical, surgical, family and social history reviewed and updated as indicated. Interim medical history since our last visit reviewed.  Review of Systems  Constitutional: Negative.  Negative for activity change, appetite change, fatigue and fever.  Gastrointestinal: Negative.  Negative for abdominal pain, nausea and vomiting.  Skin: Positive for color change (redness) and rash. Negative for wound.  Neurological: Negative.   Psychiatric/Behavioral: Negative.    Per HPI unless specifically indicated above     Objective:    BP 106/69 (BP Location: Left Arm, Patient Position: Sitting, Cuff Size: Normal)   Pulse 86   Temp 98 F (36.7 C) (Oral)   Ht 5\' 8"  (1.727 m)   Wt 156 lb (70.8 kg)   SpO2 100%   BMI 23.72 kg/m   Wt Readings from Last 3 Encounters:  02/16/20 156 lb (70.8 kg)  02/03/20 156 lb (70.8 kg)  12/09/19 155 lb (70.3 kg)    Physical Exam Vitals and nursing note  reviewed.  Constitutional:      General: She is not in acute distress.    Appearance: Normal appearance. She is not toxic-appearing.  Abdominal:     General: Abdomen is flat. There is no distension.  Musculoskeletal:        General: Normal range of motion.  Skin:    Findings: Erythema (to right pinky finger) present. No abrasion, abscess, signs of injury, laceration, lesion or wound.  Neurological:     General: No focal deficit present.     Mental Status: She is alert and oriented to person, place, and time.      Motor: No weakness.     Gait: Gait normal.  Psychiatric:        Mood and Affect: Mood normal.        Behavior: Behavior normal.        Thought Content: Thought content normal.        Judgment: Judgment normal.    Assessment & Plan:   Problem List Items Addressed This Visit      Other   Cellulitis of finger of right hand - Primary    Acute, ongoing.  No indications for I&D in office today - no fluctuance or purulence on examination.  Will treat with doxycycline 100 mg bid x 5 days- if not better by end of week, return to clinic.  Start warm soaks, continue good wound care.          Follow up plan: Return if symptoms worsen or fail to improve.

## 2020-02-16 NOTE — Assessment & Plan Note (Addendum)
Acute, ongoing.  No indications for I&D in office today - no fluctuance or purulence on examination.  Will treat with doxycycline 100 mg bid x 5 days- if not better by end of week, return to clinic.  Start warm soaks, continue good wound care.

## 2020-02-16 NOTE — Patient Instructions (Signed)
Wound Care, Adult Taking care of your wound properly can help to prevent pain, infection, and scarring. It can also help your wound to heal more quickly. How to care for your wound Wound care      Follow instructions from your health care provider about how to take care of your wound. Make sure you: ? Wash your hands with soap and water before you change the bandage (dressing). If soap and water are not available, use hand sanitizer. ? Change your dressing as told by your health care provider. ? Leave stitches (sutures), skin glue, or adhesive strips in place. These skin closures may need to stay in place for 2 weeks or longer. If adhesive strip edges start to loosen and curl up, you may trim the loose edges. Do not remove adhesive strips completely unless your health care provider tells you to do that.  Check your wound area every day for signs of infection. Check for: ? Redness, swelling, or pain. ? Fluid or blood. ? Warmth. ? Pus or a bad smell.  Ask your health care provider if you should clean the wound with mild soap and water. Doing this may include: ? Using a clean towel to pat the wound dry after cleaning it. Do not rub or scrub the wound. ? Applying a cream or ointment. Do this only as told by your health care provider. ? Covering the incision with a clean dressing.  Ask your health care provider when you can leave the wound uncovered.  Keep the dressing dry until your health care provider says it can be removed. Do not take baths, swim, use a hot tub, or do anything that would put the wound underwater until your health care provider approves. Ask your health care provider if you can take showers. You may only be allowed to take sponge baths. Medicines   If you were prescribed an antibiotic medicine, cream, or ointment, take or use the antibiotic as told by your health care provider. Do not stop taking or using the antibiotic even if your condition improves.  Take  over-the-counter and prescription medicines only as told by your health care provider. If you were prescribed pain medicine, take it 30 or more minutes before you do any wound care or as told by your health care provider. General instructions  Return to your normal activities as told by your health care provider. Ask your health care provider what activities are safe.  Do not scratch or pick at the wound.  Do not use any products that contain nicotine or tobacco, such as cigarettes and e-cigarettes. These may delay wound healing. If you need help quitting, ask your health care provider.  Keep all follow-up visits as told by your health care provider. This is important.  Eat a diet that includes protein, vitamin A, vitamin C, and other nutrient-rich foods to help the wound heal. ? Foods rich in protein include meat, dairy, beans, nuts, and other sources. ? Foods rich in vitamin A include carrots and dark green, leafy vegetables. ? Foods rich in vitamin C include citrus, tomatoes, and other fruits and vegetables. ? Nutrient-rich foods have protein, carbohydrates, fat, vitamins, or minerals. Eat a variety of healthy foods including vegetables, fruits, and whole grains. Contact a health care provider if:  You received a tetanus shot and you have swelling, severe pain, redness, or bleeding at the injection site.  Your pain is not controlled with medicine.  You have redness, swelling, or pain around the wound.    You have fluid or blood coming from the wound.  Your wound feels warm to the touch.  You have pus or a bad smell coming from the wound.  You have a fever or chills.  You are nauseous or you vomit.  You are dizzy. Get help right away if:  You have a red streak going away from your wound.  The edges of the wound open up and separate.  Your wound is bleeding, and the bleeding does not stop with gentle pressure.  You have a rash.  You faint.  You have trouble  breathing. Summary  Always wash your hands with soap and water before changing your bandage (dressing).  To help with healing, eat foods that are rich in protein, vitamin A, vitamin C, and other nutrients.  Check your wound every day for signs of infection. Contact your health care provider if you suspect that your wound is infected. This information is not intended to replace advice given to you by your health care provider. Make sure you discuss any questions you have with your health care provider. Document Revised: 10/06/2018 Document Reviewed: 01/03/2016 Elsevier Patient Education  2020 [employer]  

## 2020-02-17 NOTE — Telephone Encounter (Signed)
Called pt to follow up on x-ray ordered by [facility]. I adv that he asked me to follow up with her since he did not see where it had been done yet. She adv that she would get it done. I adv that she just needs to go over to [facility] and adv that she has an order in Epic from Dr Glennon Mac, not appt needed.

## 2020-02-17 NOTE — Telephone Encounter (Signed)
Can you follow up with her on this? I don't see an xray done so far.  Thank you!

## 2020-02-29 ENCOUNTER — Encounter: Payer: Self-pay | Admitting: Family Medicine

## 2020-02-29 NOTE — Telephone Encounter (Signed)
Cpe scheduled 03/22/20

## 2020-02-29 NOTE — Telephone Encounter (Signed)
Called pt to schedule cpe, no answer, left vm

## 2020-03-22 ENCOUNTER — Encounter: Payer: Self-pay | Admitting: Family Medicine

## 2020-03-22 ENCOUNTER — Ambulatory Visit (INDEPENDENT_AMBULATORY_CARE_PROVIDER_SITE_OTHER): Payer: Commercial Managed Care - PPO | Admitting: Family Medicine

## 2020-03-22 ENCOUNTER — Other Ambulatory Visit: Payer: Self-pay

## 2020-03-22 VITALS — BP 103/66 | HR 64 | Temp 98.6°F | Ht 65.9 in | Wt 150.4 lb

## 2020-03-22 DIAGNOSIS — F324 Major depressive disorder, single episode, in partial remission: Secondary | ICD-10-CM | POA: Diagnosis not present

## 2020-03-22 DIAGNOSIS — Z0000 Encounter for general adult medical examination without abnormal findings: Secondary | ICD-10-CM

## 2020-03-22 DIAGNOSIS — F411 Generalized anxiety disorder: Secondary | ICD-10-CM

## 2020-03-22 DIAGNOSIS — F4010 Social phobia, unspecified: Secondary | ICD-10-CM

## 2020-03-22 LAB — MICROSCOPIC EXAMINATION: WBC, UA: NONE SEEN /hpf (ref 0–5)

## 2020-03-22 LAB — URINALYSIS, ROUTINE W REFLEX MICROSCOPIC
Bilirubin, UA: NEGATIVE
Glucose, UA: NEGATIVE
Ketones, UA: NEGATIVE
Leukocytes,UA: NEGATIVE
Nitrite, UA: NEGATIVE
Protein,UA: NEGATIVE
Specific Gravity, UA: 1.025 (ref 1.005–1.030)
Urobilinogen, Ur: 0.2 mg/dL (ref 0.2–1.0)
pH, UA: 5.5 (ref 5.0–7.5)

## 2020-03-22 MED ORDER — VENLAFAXINE HCL ER 75 MG PO CP24: 75.0000 mg | ORAL_CAPSULE | Freq: Every day | ORAL | 1 refills | Status: DC

## 2020-03-22 NOTE — Progress Notes (Signed)
BP 103/66   Pulse 64   Temp 98.6 F (37 C) (Oral)   Ht 5' 5.9" (1.674 m)   Wt 150 lb 6.4 oz (68.2 kg)   LMP 03/21/2020 (Exact Date)   SpO2 98%   BMI 24.35 kg/m    Subjective:    Patient ID: Gabrielle Jackson, female    DOB: [birth year], 29 y.o.   MRN: [id number]  HPI: Gabrielle Jackson is a 29 y.o. female presenting on 03/22/2020 for comprehensive medical examination. Current medical complaints include:   Has been bruising a lot more easily. A bit nervous because her mom had leukemia and that is how she was diangosed.   ANXIETY/STRESS Duration:controlled Anxious mood: no  Excessive worrying: no Irritability: no  Sweating: no Nausea: no Palpitations:no Hyperventilation: no Panic attacks: no Agoraphobia: no  Obscessions/compulsions: no Depressed mood: no Depression screen [facility] 2/9 03/22/2020 02/13/2019 12/16/2018 07/09/2018 06/11/2018  Decreased Interest 0 2 1 0 2  Down, Depressed, Hopeless 0 1 3 0 1  PHQ - 2 Score 0 3 4 0 3  Altered sleeping 0 1 3 0 2  Tired, decreased energy 0 3 3 3 3   Change in appetite 0 2 3 0 3  Feeling bad or failure about yourself  0 2 3 0 1  Trouble concentrating 0 1 3 0 2  Moving slowly or fidgety/restless 0 1 0 0 0  Suicidal thoughts 0 0 0 0 0  PHQ-9 Score 0 13 19 3 14   Difficult doing work/chores Not difficult at all - Very difficult Not difficult at all Somewhat difficult  Some recent data might be hidden   Anhedonia: no Weight changes: no Insomnia: no   Hypersomnia: no Fatigue/loss of energy: no Feelings of worthlessness: no Feelings of guilt: no Impaired concentration/indecisiveness: yes Suicidal ideations: no  Crying spells: no Recent Stressors/Life Changes: no   Relationship problems: no   Family stress: no     Financial stress: no    Job stress: no    Recent death/loss: no  She currently lives with: kids Menopausal Symptoms: no  Depression Screen done today and results listed below:  Depression screen [facility] 2/9 03/22/2020  02/13/2019 12/16/2018 07/09/2018 06/11/2018  Decreased Interest 0 2 1 0 2  Down, Depressed, Hopeless 0 1 3 0 1  PHQ - 2 Score 0 3 4 0 3  Altered sleeping 0 1 3 0 2  Tired, decreased energy 0 3 3 3 3   Change in appetite 0 2 3 0 3  Feeling bad or failure about yourself  0 2 3 0 1  Trouble concentrating 0 1 3 0 2  Moving slowly or fidgety/restless 0 1 0 0 0  Suicidal thoughts 0 0 0 0 0  PHQ-9 Score 0 13 19 3 14   Difficult doing work/chores Not difficult at all - Very difficult Not difficult at all Somewhat difficult  Some recent data might be hidden    Past Medical History:  Past Medical History:  Diagnosis Date  . Anxiety disorder   . Breast mass 05/02/2010   lt breast pain  . Car sickness   . Depression   . Dysmenorrhea   . Ovarian cyst   . Velamentous insertion of umbilical cord     Surgical History:  Past Surgical History:  Procedure Laterality Date  . TYMPANOPLASTY Right 03/21/2018   Procedure: TYMPANOPLASTY WITH GRAFT;  Surgeon: Beverly Gust, MD;  Location: [place];  Service: ENT;  Laterality: Right;    Medications:  Current Outpatient Medications on File Prior to Visit  Medication Sig  . NUVARING 0.12-0.015 MG/24HR vaginal ring INSERT 1 RING VAGINALLY AS DIRECTED. REMOVE AFTER 3 WEEKS & WAIT 7 DAYS BEFORE INSERTING A NEW RING   No current facility-administered medications on file prior to visit.    Allergies:  Allergies  Allergen Reactions  . Penicillins Rash    Social History:  Social History   Socioeconomic History  . Marital status: Married    Spouse name: Not on file  . Number of children: 2  . Years of education: Not on file  . Highest education level: Not on file  Occupational History  . Not on file  Tobacco Use  . Smoking status: Never Smoker  . Smokeless tobacco: Never Used  Vaping Use  . Vaping Use: Never used  Substance and Sexual Activity  . Alcohol use: Yes    Alcohol/week: 0.0 standard drinks    Comment: On occasion  .  Drug use: No  . Sexual activity: Yes    Birth control/protection: None, Condom  Other Topics Concern  . Not on file  Social History Narrative  . Not on file   Social Determinants of Health   Financial Resource Strain:   . Difficulty of Paying Living Expenses: Not on file  Food Insecurity:   . Worried About Charity fundraiser in the Last Year: Not on file  . Ran Out of Food in the Last Year: Not on file  Transportation Needs:   . Lack of Transportation (Medical): Not on file  . Lack of Transportation (Non-Medical): Not on file  Physical Activity:   . Days of Exercise per Week: Not on file  . Minutes of Exercise per Session: Not on file  Stress:   . Feeling of Stress : Not on file  Social Connections:   . Frequency of Communication with Friends and Family: Not on file  . Frequency of Social Gatherings with Friends and Family: Not on file  . Attends Religious Services: Not on file  . Active Member of Clubs or Organizations: Not on file  . Attends Archivist Meetings: Not on file  . Marital Status: Not on file  Intimate Partner Violence:   . Fear of Current or Ex-Partner: Not on file  . Emotionally Abused: Not on file  . Physically Abused: Not on file  . Sexually Abused: Not on file   Social History   Tobacco Use  Smoking Status Never Smoker  Smokeless Tobacco Never Used   Social History   Substance and Sexual Activity  Alcohol Use Yes  . Alcohol/week: 0.0 standard drinks   Comment: On occasion    Family History:  Family History  Problem Relation Age of Onset  . Depression Mother   . Leukemia Mother   . Hypertension Father   . Depression Maternal Grandmother   . COPD Maternal Grandfather   . COPD Paternal Grandmother   . Lung cancer Paternal Grandmother     Past medical history, surgical history, medications, allergies, family history and social history reviewed with patient today and changes made to appropriate areas of the chart.   Review of  Systems  Constitutional: Negative.   HENT: Negative.   Eyes: Negative.   Respiratory: Negative.   Cardiovascular: Negative.   Gastrointestinal: Negative.   Genitourinary: Negative.   Musculoskeletal: Positive for joint pain. Negative for back pain, falls, myalgias and neck pain.  Skin: Negative.   Neurological: Negative.   Endo/Heme/Allergies: Negative for environmental allergies  and polydipsia. Bruises/bleeds easily.  Psychiatric/Behavioral: Negative.        Issues with concentration    All other ROS negative except what is listed above and in the HPI.      Objective:    BP 103/66   Pulse 64   Temp 98.6 F (37 C) (Oral)   Ht 5' 5.9" (1.674 m)   Wt 150 lb 6.4 oz (68.2 kg)   LMP 03/21/2020 (Exact Date)   SpO2 98%   BMI 24.35 kg/m   Wt Readings from Last 3 Encounters:  03/22/20 150 lb 6.4 oz (68.2 kg)  02/16/20 156 lb (70.8 kg)  02/03/20 156 lb (70.8 kg)    Physical Exam Vitals and nursing note reviewed.  Constitutional:      General: She is not in acute distress.    Appearance: Normal appearance. She is not ill-appearing, toxic-appearing or diaphoretic.  HENT:     Head: Normocephalic and atraumatic.     Right Ear: Tympanic membrane, ear canal and external ear normal. There is no impacted cerumen.     Left Ear: Tympanic membrane, ear canal and external ear normal. There is no impacted cerumen.     Nose: Nose normal. No congestion or rhinorrhea.     Mouth/Throat:     Mouth: Mucous membranes are moist.     Pharynx: Oropharynx is clear. No oropharyngeal exudate or posterior oropharyngeal erythema.  Eyes:     General: No scleral icterus.       Right eye: No discharge.        Left eye: No discharge.     Extraocular Movements: Extraocular movements intact.     Conjunctiva/sclera: Conjunctivae normal.     Pupils: Pupils are equal, round, and reactive to light.  Neck:     Vascular: No carotid bruit.  Cardiovascular:     Rate and Rhythm: Normal rate and regular rhythm.      Pulses: Normal pulses.     Heart sounds: No murmur heard.  No friction rub. No gallop.   Pulmonary:     Effort: Pulmonary effort is normal. No respiratory distress.     Breath sounds: Normal breath sounds. No stridor. No wheezing, rhonchi or rales.  Chest:     Chest wall: No tenderness.  Abdominal:     General: Abdomen is flat. Bowel sounds are normal. There is no distension.     Palpations: Abdomen is soft. There is no mass.     Tenderness: There is no abdominal tenderness. There is no right CVA tenderness, left CVA tenderness, guarding or rebound.     Hernia: No hernia is present.  Genitourinary:    Comments: Breast and pelvic exams deferred with shared decision making Musculoskeletal:        General: No swelling, tenderness, deformity or signs of injury.     Cervical back: Normal range of motion and neck supple. No rigidity. No muscular tenderness.     Right lower leg: No edema.     Left lower leg: No edema.  Lymphadenopathy:     Cervical: No cervical adenopathy.  Skin:    General: Skin is warm and dry.     Capillary Refill: Capillary refill takes less than 2 seconds.     Coloration: Skin is not jaundiced or pale.     Findings: No bruising, erythema, lesion or rash.  Neurological:     General: No focal deficit present.     Mental Status: She is alert and oriented to person, place, and time.  Mental status is at baseline.     Cranial Nerves: No cranial nerve deficit.     Sensory: No sensory deficit.     Motor: No weakness.     Coordination: Coordination normal.     Gait: Gait normal.     Deep Tendon Reflexes: Reflexes normal.  Psychiatric:        Mood and Affect: Mood normal.        Behavior: Behavior normal.        Thought Content: Thought content normal.        Judgment: Judgment normal.     Results for orders placed or performed in visit on 11/25/19  Beta hCG quant (ref lab)  Result Value Ref Range   hCG Quant <1 mIU/mL  POCT urine pregnancy  Result Value Ref  Range   Preg Test, Ur Negative Negative      Assessment & Plan:   Problem List Items Addressed This Visit      Other   GAD (generalized anxiety disorder)   Relevant Medications   venlafaxine XR (EFFEXOR XR) 75 MG 24 hr capsule   Social anxiety disorder   Relevant Medications   venlafaxine XR (EFFEXOR XR) 75 MG 24 hr capsule   MDD (major depressive disorder), single episode, in partial remission ([place])    Under good control on current regimen. Continue current regimen. Continue to monitor. Call with any concerns. Refills given. Recheck 6 months.        Relevant Medications   venlafaxine XR (EFFEXOR XR) 75 MG 24 hr capsule    Other Visit Diagnoses    Routine general medical examination at a health care facility    -  Primary   Vaccines up to date/declined. Screening labs checked today. Pap up to date. Continue diet and exercsie. Call with any concerns.    Relevant Orders   CBC with Differential/Platelet   Comprehensive metabolic panel   Lipid Panel w/o Chol/HDL Ratio   TSH   Urinalysis, Routine w reflex microscopic   Hepatitis C Antibody       Follow up plan: Return in about 6 months (around 09/19/2020).   LABORATORY TESTING:  - Pap smear: up to date  IMMUNIZATIONS:   - Tdap: Tetanus vaccination status reviewed: last tetanus booster within 10 years. - Influenza: Refused - Pneumovax: Not applicable - COVID: Refused  PATIENT COUNSELING:   Advised to take 1 mg of folate supplement per day if capable of pregnancy.   Sexuality: Discussed sexually transmitted diseases, partner selection, use of condoms, avoidance of unintended pregnancy  and contraceptive alternatives.   Advised to avoid cigarette smoking.  I discussed with the patient that most people either abstain from alcohol or drink within safe limits (<=14/week and <=4 drinks/occasion for males, <=7/weeks and <= 3 drinks/occasion for females) and that the risk for alcohol disorders and other health effects rises  proportionally with the number of drinks per week and how often a drinker exceeds daily limits.  Discussed cessation/primary prevention of drug use and availability of treatment for abuse.   Diet: Encouraged to adjust caloric intake to maintain  or achieve ideal body weight, to reduce intake of dietary saturated fat and total fat, to limit sodium intake by avoiding high sodium foods and not adding table salt, and to maintain adequate dietary potassium and calcium preferably from fresh fruits, vegetables, and low-fat dairy products.    stressed the importance of regular exercise  Injury prevention: Discussed safety belts, safety helmets, smoke detector, smoking near bedding  or upholstery.   Dental health: Discussed importance of regular tooth brushing, flossing, and dental visits.    NEXT PREVENTATIVE PHYSICAL DUE IN 1 YEAR. Return in about 6 months (around 09/19/2020).

## 2020-03-22 NOTE — Patient Instructions (Signed)
Health Maintenance, Female Adopting a healthy lifestyle and getting preventive care are important in promoting health and wellness. Ask your health care provider about:  The right schedule for you to have regular tests and exams.  Things you can do on your own to prevent diseases and keep yourself healthy. What should I know about diet, weight, and exercise? Eat a healthy diet   Eat a diet that includes plenty of vegetables, fruits, low-fat dairy products, and lean protein.  Do not eat a lot of foods that are high in solid fats, added sugars, or sodium. Maintain a healthy weight Body mass index (BMI) is used to identify weight problems. It estimates body fat based on height and weight. Your health care provider can help determine your BMI and help you achieve or maintain a healthy weight. Get regular exercise Get regular exercise. This is one of the most important things you can do for your health. Most adults should:  Exercise for at least 150 minutes each week. The exercise should increase your heart rate and make you sweat (moderate-intensity exercise).  Do strengthening exercises at least twice a week. This is in addition to the moderate-intensity exercise.  Spend less time sitting. Even light physical activity can be beneficial. Watch cholesterol and blood lipids Have your blood tested for lipids and cholesterol at 29 years of age, then have this test every 5 years. Have your cholesterol levels checked more often if:  Your lipid or cholesterol levels are high.  You are older than 29 years of age.  You are at high risk for heart disease. What should I know about cancer screening? Depending on your health history and family history, you may need to have cancer screening at various ages. This may include screening for:  Breast cancer.  Cervical cancer.  Colorectal cancer.  Skin cancer.  Lung cancer. What should I know about heart disease, diabetes, and high blood  pressure? Blood pressure and heart disease  High blood pressure causes heart disease and increases the risk of stroke. This is more likely to develop in people who have high blood pressure readings, are of African descent, or are overweight.  Have your blood pressure checked: ? Every 3-5 years if you are 18-39 years of age. ? Every year if you are 40 years old or older. Diabetes Have regular diabetes screenings. This checks your fasting blood sugar level. Have the screening done:  Once every three years after age 40 if you are at a normal weight and have a low risk for diabetes.  More often and at a younger age if you are overweight or have a high risk for diabetes. What should I know about preventing infection? Hepatitis B If you have a higher risk for hepatitis B, you should be screened for this virus. Talk with your health care provider to find out if you are at risk for hepatitis B infection. Hepatitis C Testing is recommended for:  Everyone born from 1945 through 1965.  Anyone with known risk factors for hepatitis C. Sexually transmitted infections (STIs)  Get screened for STIs, including gonorrhea and chlamydia, if: ? You are sexually active and are younger than 29 years of age. ? You are older than 29 years of age and your health care provider tells you that you are at risk for this type of infection. ? Your sexual activity has changed since you were last screened, and you are at increased risk for chlamydia or gonorrhea. Ask your health care provider if   you are at risk.  Ask your health care provider about whether you are at high risk for HIV. Your health care provider may recommend a prescription medicine to help prevent HIV infection. If you choose to take medicine to prevent HIV, you should first get tested for HIV. You should then be tested every 3 months for as long as you are taking the medicine. Pregnancy  If you are about to stop having your period (premenopausal) and  you may become pregnant, seek counseling before you get pregnant.  Take 400 to 800 micrograms (mcg) of folic acid every day if you become pregnant.  Ask for birth control (contraception) if you want to prevent pregnancy. Osteoporosis and menopause Osteoporosis is a disease in which the bones lose minerals and strength with aging. This can result in bone fractures. If you are 65 years old or older, or if you are at risk for osteoporosis and fractures, ask your health care provider if you should:  Be screened for bone loss.  Take a calcium or vitamin D supplement to lower your risk of fractures.  Be given hormone replacement therapy (HRT) to treat symptoms of menopause. Follow these instructions at home: Lifestyle  Do not use any products that contain nicotine or tobacco, such as cigarettes, e-cigarettes, and chewing tobacco. If you need help quitting, ask your health care provider.  Do not use street drugs.  Do not share needles.  Ask your health care provider for help if you need support or information about quitting drugs. Alcohol use  Do not drink alcohol if: ? Your health care provider tells you not to drink. ? You are pregnant, may be pregnant, or are planning to become pregnant.  If you drink alcohol: ? Limit how much you use to 0-1 drink a day. ? Limit intake if you are breastfeeding.  Be aware of how much alcohol is in your drink. In the [place] one drink equals one 12 oz bottle of beer (355 mL), one 5 oz glass of wine (148 mL), or one 1 oz glass of hard liquor (44 mL). General instructions  Schedule regular health, dental, and eye exams.  Stay current with your vaccines.  Tell your health care provider if: ? You often feel depressed. ? You have ever been abused or do not feel safe at home. Summary  Adopting a healthy lifestyle and getting preventive care are important in promoting health and wellness.  Follow your health care provider's instructions about healthy  diet, exercising, and getting tested or screened for diseases.  Follow your health care provider's instructions on monitoring your cholesterol and blood pressure. This information is not intended to replace advice given to you by your health care provider. Make sure you discuss any questions you have with your health care provider. Document Revised: 06/11/2018 Document Reviewed: 06/11/2018 Elsevier Patient Education  2020 [employer]  

## 2020-03-22 NOTE — Assessment & Plan Note (Signed)
Under good control on current regimen. Continue current regimen. Continue to monitor. Call with any concerns. Refills given. Recheck 6 months.

## 2020-03-23 LAB — HEPATITIS C ANTIBODY: Hep C Virus Ab: <0.1 s/co ratio (ref 0.0–0.9)

## 2020-03-23 LAB — CBC WITH DIFFERENTIAL/PLATELET
Basophils Absolute: 0.1 10*3/uL (ref 0.0–0.2)
Basos: 1 %
EOS (ABSOLUTE): 0.1 10*3/uL (ref 0.0–0.4)
Eos: 1 %
Hematocrit: 40.7 % (ref 34.0–46.6)
Hemoglobin: 14.2 g/dL (ref 11.1–15.9)
Immature Grans (Abs): 0 10*3/uL (ref 0.0–0.1)
Immature Granulocytes: 0 %
Lymphocytes Absolute: 2.3 10*3/uL (ref 0.7–3.1)
Lymphs: 25 %
MCH: 33.6 pg — ABNORMAL HIGH (ref 26.6–33.0)
MCHC: 34.9 g/dL (ref 31.5–35.7)
MCV: 96 fL (ref 79–97)
Monocytes Absolute: 0.6 10*3/uL (ref 0.1–0.9)
Monocytes: 6 %
Neutrophils Absolute: 6.2 10*3/uL (ref 1.4–7.0)
Neutrophils: 67 %
Platelets: 338 10*3/uL (ref 150–450)
RBC: 4.23 x10E6/uL (ref 3.77–5.28)
RDW: 11.5 % — ABNORMAL LOW (ref 11.7–15.4)
WBC: 9.2 10*3/uL (ref 3.4–10.8)

## 2020-03-23 LAB — LIPID PANEL W/O CHOL/HDL RATIO
Cholesterol, Total: 106 mg/dL (ref 100–199)
HDL: 50 mg/dL (ref >39)
LDL Chol Calc (NIH): 40 mg/dL (ref 0–99)
Triglycerides: 77 mg/dL (ref 0–149)
VLDL Cholesterol Cal: 16 mg/dL (ref 5–40)

## 2020-03-23 LAB — COMPREHENSIVE METABOLIC PANEL
ALT: 31 IU/L (ref 0–32)
AST: 20 IU/L (ref 0–40)
Albumin/Globulin Ratio: 1.6 (ref 1.2–2.2)
Albumin: 4.5 g/dL (ref 3.9–5.0)
Alkaline Phosphatase: 96 IU/L (ref 44–121)
BUN/Creatinine Ratio: 11 (ref 9–23)
BUN: 12 mg/dL (ref 6–20)
Bilirubin Total: 0.2 mg/dL (ref 0.0–1.2)
CO2: 21 mmol/L (ref 20–29)
Calcium: 9.6 mg/dL (ref 8.7–10.2)
Chloride: 107 mmol/L — ABNORMAL HIGH (ref 96–106)
Creatinine, Ser: 1.11 mg/dL — ABNORMAL HIGH (ref 0.57–1.00)
GFR calc Af Amer: 78 mL/min/{1.73_m2} (ref >59)
GFR calc non Af Amer: 67 mL/min/{1.73_m2} (ref >59)
Globulin, Total: 2.8 g/dL (ref 1.5–4.5)
Glucose: 91 mg/dL (ref 65–99)
Potassium: 4 mmol/L (ref 3.5–5.2)
Sodium: 143 mmol/L (ref 134–144)
Total Protein: 7.3 g/dL (ref 6.0–8.5)

## 2020-03-23 LAB — TSH: TSH: 0.903 u[IU]/mL (ref 0.450–4.500)

## 2020-03-24 ENCOUNTER — Encounter: Payer: Self-pay | Admitting: Family Medicine

## 2020-03-25 NOTE — Telephone Encounter (Signed)
Please call patient and follow up on this. Thanks!

## 2020-03-28 NOTE — Telephone Encounter (Signed)
Please let me know when pt callsback ?

## 2020-03-31 NOTE — Telephone Encounter (Signed)
Called pt again.  No answer.

## 2020-04-05 NOTE — Telephone Encounter (Signed)
Cannot get in touch with pt. Please let me know if she returns our calls

## 2020-04-25 ENCOUNTER — Other Ambulatory Visit: Payer: Self-pay

## 2020-04-25 ENCOUNTER — Emergency Department
Admission: EM | Admit: 2020-04-25 | Discharge: 2020-04-25 | Disposition: A | Discharge status: Home / Self Care | Payer: Commercial Managed Care - PPO | Attending: Emergency Medicine | Admitting: Emergency Medicine

## 2020-04-25 ENCOUNTER — Emergency Department: Payer: Commercial Managed Care - PPO

## 2020-04-25 ENCOUNTER — Encounter: Payer: Self-pay | Admitting: Emergency Medicine

## 2020-04-25 DIAGNOSIS — R109 Unspecified abdominal pain: Secondary | ICD-10-CM | POA: Diagnosis present

## 2020-04-25 DIAGNOSIS — N200 Calculus of kidney: Secondary | ICD-10-CM

## 2020-04-25 DIAGNOSIS — N132 Hydronephrosis with renal and ureteral calculous obstruction: Secondary | ICD-10-CM | POA: Diagnosis not present

## 2020-04-25 LAB — CBC
HCT: 41 % (ref 36.0–46.0)
Hemoglobin: 14.5 g/dL (ref 12.0–15.0)
MCH: 32.5 pg (ref 26.0–34.0)
MCHC: 35.4 g/dL (ref 30.0–36.0)
MCV: 91.9 fL (ref 80.0–100.0)
Platelets: 369 10*3/uL (ref 150–400)
RBC: 4.46 MIL/uL (ref 3.87–5.11)
RDW: 11.9 % (ref 11.5–15.5)
WBC: 7.2 10*3/uL (ref 4.0–10.5)
nRBC: 0 % (ref 0.0–0.2)

## 2020-04-25 LAB — BASIC METABOLIC PANEL
Anion gap: 9 (ref 5–15)
BUN: 10 mg/dL (ref 6–20)
CO2: 22 mmol/L (ref 22–32)
Calcium: 9.3 mg/dL (ref 8.9–10.3)
Chloride: 108 mmol/L (ref 98–111)
Creatinine, Ser: 1.13 mg/dL — ABNORMAL HIGH (ref 0.44–1.00)
GFR, Estimated: >60 mL/min (ref >60)
Glucose, Bld: 88 mg/dL (ref 70–99)
Potassium: 3.8 mmol/L (ref 3.5–5.1)
Sodium: 139 mmol/L (ref 135–145)

## 2020-04-25 LAB — URINALYSIS, COMPLETE (UACMP) WITH MICROSCOPIC
Bacteria, UA: NONE SEEN
Bilirubin Urine: NEGATIVE
Glucose, UA: NEGATIVE mg/dL
Ketones, ur: NEGATIVE mg/dL
Leukocytes,Ua: NEGATIVE
Nitrite: NEGATIVE
Protein, ur: NEGATIVE mg/dL
Specific Gravity, Urine: 1.018 (ref 1.005–1.030)
pH: 5 (ref 5.0–8.0)

## 2020-04-25 LAB — POCT PREGNANCY, URINE: Preg Test, Ur: NEGATIVE

## 2020-04-25 MED ORDER — TAMSULOSIN HCL 0.4 MG PO CAPS: 0.4000 mg | ORAL_CAPSULE | Freq: Every day | ORAL | 0 refills | Status: DC

## 2020-04-25 MED ORDER — SODIUM CHLORIDE 0.9 % IV BOLUS
1000.0000 mL | Freq: Once | INTRAVENOUS | Status: AC
  Administered 2020-04-25: 1000 mL via INTRAVENOUS

## 2020-04-25 MED ORDER — ONDANSETRON HCL 4 MG/2ML IJ SOLN
4.0000 mg | Freq: Once | INTRAMUSCULAR | Status: AC
  Administered 2020-04-25: 4 mg via INTRAVENOUS
  Filled 2020-04-25: qty 2

## 2020-04-25 MED ORDER — KETOROLAC TROMETHAMINE 30 MG/ML IJ SOLN
30.0000 mg | Freq: Once | INTRAMUSCULAR | Status: AC
  Administered 2020-04-25: 30 mg via INTRAVENOUS
  Filled 2020-04-25: qty 1

## 2020-04-25 MED ORDER — HYDROCODONE-ACETAMINOPHEN 5-325 MG PO TABS
2.0000 | ORAL_TABLET | Freq: Once | ORAL | Status: AC
  Administered 2020-04-25: 2 via ORAL
  Filled 2020-04-25: qty 2

## 2020-04-25 MED ORDER — HYDROCODONE-ACETAMINOPHEN 5-325 MG PO TABS
1.0000 | ORAL_TABLET | ORAL | 0 refills | Status: DC | PRN
Start: 2020-04-25 — End: 2020-07-08

## 2020-04-25 MED ORDER — FENTANYL CITRATE (PF) 100 MCG/2ML IJ SOLN
50.0000 ug | Freq: Once | INTRAMUSCULAR | Status: AC
  Administered 2020-04-25: 50 ug via INTRAVENOUS
  Filled 2020-04-25: qty 2

## 2020-04-25 MED ORDER — ONDANSETRON 4 MG PO TBDP: 4.0000 mg | ORAL_TABLET | Freq: Three times a day (TID) | ORAL | 0 refills | Status: DC | PRN

## 2020-04-25 NOTE — ED Notes (Signed)
E-signature not working at this time. Pt verbalized understanding of D/C instructions, prescriptions and follow up care with no further questions at this time. Pt in NAD and ambulatory at time of D/C.  

## 2020-04-25 NOTE — ED Triage Notes (Signed)
Pt via POV from home. Pt states she is having R sided flank pain since this morning around 0600. Denies NVD. Pt has a hx of kidney stones and feels this is the same. Pt is A&Ox4

## 2020-04-25 NOTE — ED Provider Notes (Signed)
[facility] Emergency Department Provider Note  Time seen: 10:00 AM  I have reviewed the triage vital signs and the nursing notes.   HISTORY  Chief Complaint Flank Pain    HPI Gabrielle Jackson is a 29 y.o. female with a past medical history of anxiety, depression, 1 prior kidney stone, presents to the emergency department for right flank pain.  According to the patient since around 6 AM this morning she has been experiencing sharp stabbing pain in the right flank to right lower quadrant.  Patient states nausea but denies any vomiting.  No diarrhea.  No fever or dysuria.  Last menstrual period was 2 weeks ago.   Past Medical History:  Diagnosis Date  . Anxiety disorder   . Breast mass 05/02/2010   lt breast pain  . Car sickness   . Depression   . Dysmenorrhea   . Ovarian cyst   . Velamentous insertion of umbilical cord     Patient Active Problem List   Diagnosis Date Noted  . Cellulitis of finger of right hand 02/16/2020  . MDD (major depressive disorder), single episode, in partial remission ([place]) 05/12/2019  . Social anxiety disorder 04/01/2019  . Bereavement 03/13/2019  . Depressive disorder 02/13/2019  . GAD (generalized anxiety disorder) 09/05/2015  . Partial hamstring tear 05/02/2015    Past Surgical History:  Procedure Laterality Date  . TYMPANOPLASTY Right 03/21/2018   Procedure: TYMPANOPLASTY WITH GRAFT;  Surgeon: Beverly Gust, MD;  Location: [place];  Service: ENT;  Laterality: Right;    Prior to Admission medications   Medication Sig Start Date End Date Taking? Authorizing Provider  [place] 0.12-0.015 MG/24HR vaginal ring INSERT 1 RING VAGINALLY AS DIRECTED. REMOVE AFTER 3 WEEKS & WAIT 7 DAYS BEFORE INSERTING A NEW RING 02/21/20   [provider]  venlafaxine XR (EFFEXOR XR) 75 MG 24 hr capsule Take 1 capsule (75 mg total) by mouth daily with breakfast. 03/22/20   Park Liter P, DO    Allergies  Allergen  Reactions  . Penicillins Rash    Family History  Problem Relation Age of Onset  . Depression Mother   . Leukemia Mother   . Hypertension Father   . Depression Maternal Grandmother   . COPD Maternal Grandfather   . COPD Paternal Grandmother   . Lung cancer Paternal Grandmother     Social History Social History   Tobacco Use  . Smoking status: Never Smoker  . Smokeless tobacco: Never Used  Vaping Use  . Vaping Use: Never used  Substance Use Topics  . Alcohol use: Yes    Alcohol/week: 0.0 standard drinks    Comment: On occasion  . Drug use: No    Review of Systems Constitutional: Negative for fever. Cardiovascular: Negative for chest pain. Respiratory: Negative for shortness of breath. Gastrointestinal: Moderate right flank sharp stabbing pain.  Positive for nausea.  Negative for vomiting or diarrhea.  States this feels similar to the past kidney stone she had which did pass on its own without intervention. Genitourinary: Negative for urinary compaints Musculoskeletal: Negative for musculoskeletal complaints Neurological: Negative for headache All other ROS negative  ____________________________________________   PHYSICAL EXAM:  VITAL SIGNS: ED Triage Vitals  Enc Vitals Group     BP 04/25/20 0910 122/77     Pulse Rate 04/25/20 0910 68     Resp 04/25/20 0910 18     Temp 04/25/20 0910 99.1 F (37.3 C)     Temp Source 04/25/20 0910 Oral  SpO2 04/25/20 0910 98 %     Weight 04/25/20 0911 155 lb (70.3 kg)     Height 04/25/20 0911 5\' 8"  (1.727 m)     Head Circumference --      Peak Flow --      Pain Score 04/25/20 0910 9     Pain Loc --      Pain Edu? --      Excl. in [place]? --     Constitutional: Alert and oriented. Well appearing and in no distress. Eyes: Normal exam ENT      Head: Normocephalic and atraumatic.      Mouth/Throat: Mucous membranes are moist. Cardiovascular: Normal rate, regular rhythm.  Respiratory: Normal respiratory effort without  tachypnea nor retractions. Breath sounds are clear  Gastrointestinal: Soft, mild right lower quadrant tenderness to palpation without rebound guarding or distention.  Moderate right CVA tenderness. Musculoskeletal: Nontender with normal range of motion in all extremities.  Neurologic:  Normal speech and language. No gross focal neurologic deficits  Skin:  Skin is warm, dry and intact.  Psychiatric: Mood and affect are normal.   ____________________________________________     RADIOLOGY  CT scan shows a right 2 mm UVJ stone.  ____________________________________________   INITIAL IMPRESSION / ASSESSMENT AND PLAN / ED COURSE  Pertinent labs & imaging results that were available during my care of the patient were reviewed by me and considered in my medical decision making (see chart for details).   Patient presents emergency department for right flank pain and nausea.  States this does feel similar to past kidney stone she experienced.  Clinical presentation is consistent with possible ureterolithiasis, differential would also include UTI or pyelonephritis, ovarian cyst or appendicitis.  As patient has only had 1 prior kidney stone we will obtain CT imaging to evaluate for ureterolithiasis.  Patient's basic lab work has returned largely within normal limits besides mild hematuria in the patient's urine again likely consistent with ureterolithiasis.  CT shows a 2 mm right UVJ stone likely cause of the patient's pain and discomfort.  The remainder the patient's work-up is been largely nonrevealing.  Patient states her pain is decreased from a 10/10 to now a 5/10 after medications.  We will dose Norco discharged on the same as well as Flomax and a urine strainer.  I discussed metoprolol urinary stone return precautions as well as urology follow-up.  Gabrielle Jackson was evaluated in Emergency Department on 04/25/2020 for the symptoms described in the history of present illness. She was  evaluated in the context of the global COVID-19 pandemic, which necessitated consideration that the patient might be at risk for infection with the SARS-CoV-2 virus that causes COVID-19. Institutional protocols and algorithms that pertain to the evaluation of patients at risk for COVID-19 are in a state of rapid change based on information released by regulatory bodies including the CDC and federal and state organizations. These policies and algorithms were followed during the patient's care in the ED.  ____________________________________________   FINAL CLINICAL IMPRESSION(S) / ED DIAGNOSES  Kidney stone   Harvest Dark, MD 04/25/20 1056

## 2020-04-26 LAB — URINE CULTURE: Culture: <10000 — AB

## 2020-06-03 ENCOUNTER — Encounter: Payer: Self-pay | Admitting: Family Medicine

## 2020-06-06 ENCOUNTER — Ambulatory Visit
Admission: EM | Admit: 2020-06-06 | Discharge: 2020-06-06 | Disposition: A | Discharge status: Home / Self Care | Payer: Commercial Managed Care - PPO | Attending: Family Medicine | Admitting: Family Medicine

## 2020-06-06 ENCOUNTER — Encounter: Payer: Self-pay | Admitting: Emergency Medicine

## 2020-06-06 ENCOUNTER — Other Ambulatory Visit: Payer: Self-pay

## 2020-06-06 DIAGNOSIS — J0100 Acute maxillary sinusitis, unspecified: Secondary | ICD-10-CM | POA: Diagnosis not present

## 2020-06-06 MED ORDER — PREDNISONE 10 MG (21) PO TBPK: ORAL_TABLET | ORAL | 0 refills | Status: DC

## 2020-06-06 MED ORDER — AMOXICILLIN-POT CLAVULANATE 875-125 MG PO TABS: 1.0000 | ORAL_TABLET | Freq: Two times a day (BID) | ORAL | 0 refills | Status: DC

## 2020-06-06 NOTE — ED Provider Notes (Signed)
Roderic Palau    CSN: [id number] Arrival date & time: 06/06/20  1136      History   Chief Complaint Chief Complaint  Patient presents with  . Nasal Congestion  . Cough    HPI Gabrielle Jackson is a 29 y.o. female.   Patient is a 29 year old female who presents today with nasal congestion, productive cough with green sputum, malaise, ear pain x2 weeks.  Symptoms have worsened.  She has been treated previously with doxycycline and prednisone prior to that which reports has not helped her symptoms at all.  She did feel mildly better with the prednisone but this was over a week ago.  Low-grade fever here today.  Denies any fevers at home.  Had negative Covid screening. No chest pain, SOB.      Past Medical History:  Diagnosis Date  . Anxiety disorder   . Breast mass 05/02/2010   lt breast pain  . Car sickness   . Depression   . Dysmenorrhea   . Ovarian cyst   . Velamentous insertion of umbilical cord     Patient Active Problem List   Diagnosis Date Noted  . Cellulitis of finger of right hand 02/16/2020  . MDD (major depressive disorder), single episode, in partial remission ([place]) 05/12/2019  . Social anxiety disorder 04/01/2019  . Bereavement 03/13/2019  . Depressive disorder 02/13/2019  . GAD (generalized anxiety disorder) 09/05/2015  . Partial hamstring tear 05/02/2015    Past Surgical History:  Procedure Laterality Date  . TYMPANOPLASTY Right 03/21/2018   Procedure: TYMPANOPLASTY WITH GRAFT;  Surgeon: Beverly Gust, MD;  Location: [place];  Service: ENT;  Laterality: Right;    OB History    Gravida  2   Para  2   Term  2   Preterm      AB  0   Living  2     SAB  0   TAB      Ectopic      Multiple      Live Births  2            Home Medications    Prior to Admission medications   Medication Sig Start Date End Date Taking? Authorizing Provider  HYDROcodone-acetaminophen (NORCO/VICODIN) 5-325 MG tablet Take 1  tablet by mouth every 4 (four) hours as needed. 04/25/20  Yes Harvest Dark, MD  [place] 0.12-0.015 MG/24HR vaginal ring INSERT 1 RING VAGINALLY AS DIRECTED. REMOVE AFTER 3 WEEKS & WAIT 7 DAYS BEFORE INSERTING A NEW RING 02/21/20  Yes [provider]  venlafaxine XR (EFFEXOR XR) 75 MG 24 hr capsule Take 1 capsule (75 mg total) by mouth daily with breakfast. 03/22/20  Yes Johnson, Megan P, DO  amoxicillin-clavulanate (AUGMENTIN) 875-125 MG tablet Take 1 tablet by mouth every 12 (twelve) hours. 06/06/20   Loura Halt A, NP  predniSONE (STERAPRED UNI-PAK 21 TAB) 10 MG (21) TBPK tablet 6 tabs for 1 day, then 5 tabs for 1 das, then 4 tabs for 1 day, then 3 tabs for 1 day, 2 tabs for 1 day, then 1 tab for 1 day 06/06/20   Orvan July, NP    Family History Family History  Problem Relation Age of Onset  . Depression Mother   . Leukemia Mother   . Hypertension Father   . Depression Maternal Grandmother   . COPD Maternal Grandfather   . COPD Paternal Grandmother   . Lung cancer Paternal Grandmother     Social History  Social History   Tobacco Use  . Smoking status: Never Smoker  . Smokeless tobacco: Never Used  Vaping Use  . Vaping Use: Never used  Substance Use Topics  . Alcohol use: Yes    Alcohol/week: 0.0 standard drinks    Comment: On occasion  . Drug use: No     Allergies   Penicillins   Review of Systems Review of Systems   Physical Exam Triage Vital Signs ED Triage Vitals  Enc Vitals Group     BP 06/06/20 1153 112/80     Pulse Rate 06/06/20 1153 93     Resp 06/06/20 1153 19     Temp 06/06/20 1153 99.4 F (37.4 C)     Temp Source 06/06/20 1153 Oral     SpO2 06/06/20 1153 96 %     Weight 06/06/20 1151 155 lb (70.3 kg)     Height 06/06/20 1151 5\' 7"  (1.702 m)     Head Circumference --      Peak Flow --      Pain Score 06/06/20 1151 7     Pain Loc --      Pain Edu? --      Excl. in [place]? --    No data found.  Updated Vital Signs BP 112/80 (BP  Location: Left Arm)   Pulse 93   Temp 99.4 F (37.4 C) (Oral)   Resp 19   Ht 5\' 7"  (1.702 m)   Wt 155 lb (70.3 kg)   LMP 05/07/2020   SpO2 96%   BMI 24.28 kg/m   Visual Acuity Right Eye Distance:   Left Eye Distance:   Bilateral Distance:    Right Eye Near:   Left Eye Near:    Bilateral Near:     Physical Exam Vitals and nursing note reviewed.  Constitutional:      General: She is not in acute distress.    Appearance: She is ill-appearing. She is not toxic-appearing or diaphoretic.  HENT:     Head: Normocephalic and atraumatic.     Ears:     Comments: Bilateral TM injection with effusion to right.     Nose: Congestion present.     Right Sinus: Maxillary sinus tenderness and frontal sinus tenderness present.     Left Sinus: Maxillary sinus tenderness and frontal sinus tenderness present.     Mouth/Throat:     Pharynx: Posterior oropharyngeal erythema present.  Eyes:     Conjunctiva/sclera: Conjunctivae normal.  Cardiovascular:     Rate and Rhythm: Normal rate and regular rhythm.  Pulmonary:     Effort: Pulmonary effort is normal.     Breath sounds: Normal breath sounds.  Neurological:     Mental Status: She is alert.      UC Treatments / Results  Labs (all labs ordered are listed, but only abnormal results are displayed) Labs Reviewed - No data to display  EKG   Radiology No results found.  Procedures Procedures (including critical care time)  Medications Ordered in UC Medications - No data to display  Initial Impression / Assessment and Plan / UC Course  I have reviewed the triage vital signs and the nursing notes.  Pertinent labs & imaging results that were available during my care of the patient were reviewed by me and considered in my medical decision making (see chart for details).     Acute sinusitis Pt failed treatment with doxycycline. It has been over 2 weeks and she is worse.  Will try  treatment with Augmentin and a steroid  taper Recommended continue over-the-counter medicines as needed Follow up as needed for continued or worsening symptoms  Final Clinical Impressions(s) / UC Diagnoses   Final diagnoses:  Acute non-recurrent maxillary sinusitis     Discharge Instructions     Medicines as prescribed Follow up as needed for continued or worsening symptoms     ED Prescriptions    Medication Sig Dispense Auth. Provider   amoxicillin-clavulanate (AUGMENTIN) 875-125 MG tablet Take 1 tablet by mouth every 12 (twelve) hours. 14 tablet Yanessa Hocevar A, NP   predniSONE (STERAPRED UNI-PAK 21 TAB) 10 MG (21) TBPK tablet 6 tabs for 1 day, then 5 tabs for 1 das, then 4 tabs for 1 day, then 3 tabs for 1 day, 2 tabs for 1 day, then 1 tab for 1 day 21 tablet Anika Shore A, NP     PDMP not reviewed this encounter.   Orvan July, NP 06/06/20 1245

## 2020-06-06 NOTE — ED Triage Notes (Signed)
Patent c/o nasal congestion, productive cough w/ "green" sputum, malaise x 2 weeks.  Patient states symptoms have progressively became worst and no relief of symptoms with medication.   Patient denies fever.   Patent was seen at [facility] last Monday and patient reports "they diagnosed with pneumonia".  Patient was placed on Doxycycline and has finished prescription.  Pateint was also seen by a [facility] provider and given prednisone prescription.

## 2020-06-06 NOTE — Discharge Instructions (Addendum)
Medicines as prescribed Follow up as needed for continued or worsening symptoms  

## 2020-06-20 ENCOUNTER — Encounter: Payer: Self-pay | Admitting: Family Medicine

## 2020-07-08 ENCOUNTER — Other Ambulatory Visit: Payer: Self-pay

## 2020-07-08 ENCOUNTER — Encounter: Payer: Self-pay | Admitting: Family Medicine

## 2020-07-08 ENCOUNTER — Ambulatory Visit (INDEPENDENT_AMBULATORY_CARE_PROVIDER_SITE_OTHER): Payer: Commercial Managed Care - PPO | Admitting: Family Medicine

## 2020-07-08 VITALS — BP 122/86 | HR 83 | Temp 98.6°F | Wt 155.4 lb

## 2020-07-08 DIAGNOSIS — R202 Paresthesia of skin: Secondary | ICD-10-CM

## 2020-07-08 DIAGNOSIS — R200 Anesthesia of skin: Secondary | ICD-10-CM

## 2020-07-08 NOTE — Progress Notes (Signed)
BP 122/86   Pulse 83   Temp 98.6 F (37 C)   Wt 155 lb 6.4 oz (70.5 kg)   SpO2 98%   BMI 24.34 kg/m    Subjective:    Patient ID: Gabrielle Jackson, female    DOB: 01/11/1991, 30 y.o.   MRN: [id number]  HPI: Gabrielle Jackson is a 30 y.o. female  Chief Complaint  Patient presents with  . Numbness    Pt states for months hands have been feeling numb almost every day, mainly the right hand. Recently fingers on right hand "locked up" per patient, couldn't bend them back straight.    NUMBNESS Duration: sevreeal months but getting worse Onset: gradual Location: whole R hand Bilateral: yes R>L Symmetric: no Decreased sensation: no  Weakness: yes Pain: yes Quality:  Numb and tingling Severity: moderate  Frequency: about an hour and half 2 hours, coming and going Trauma: no Recent illness: no Diabetes: no Thyroid disease: no  HIV: no  Alcoholism: no  Spinal cord injury: no Status: worse  Relevant past medical, surgical, family and social history reviewed and updated as indicated. Interim medical history since our last visit reviewed. Allergies and medications reviewed and updated.  Review of Systems  Constitutional: Negative.   Respiratory: Negative.   Cardiovascular: Negative.   Gastrointestinal: Negative.   Musculoskeletal: Negative.   Neurological: Positive for weakness and numbness. Negative for dizziness, tremors, seizures, syncope, facial asymmetry, speech difficulty, light-headedness and headaches.  Psychiatric/Behavioral: Negative.     Per HPI unless specifically indicated above     Objective:    BP 122/86   Pulse 83   Temp 98.6 F (37 C)   Wt 155 lb 6.4 oz (70.5 kg)   SpO2 98%   BMI 24.34 kg/m   Wt Readings from Last 3 Encounters:  07/08/20 155 lb 6.4 oz (70.5 kg)  06/06/20 155 lb (70.3 kg)  04/25/20 155 lb (70.3 kg)    Physical Exam Vitals and nursing note reviewed.  Constitutional:      General: She is not in acute distress.     Appearance: Normal appearance. She is not ill-appearing, toxic-appearing or diaphoretic.  HENT:     Head: Normocephalic and atraumatic.     Right Ear: External ear normal.     Left Ear: External ear normal.     Nose: Nose normal.     Mouth/Throat:     Mouth: Mucous membranes are moist.     Pharynx: Oropharynx is clear.  Eyes:     General: No scleral icterus.       Right eye: No discharge.        Left eye: No discharge.     Extraocular Movements: Extraocular movements intact.     Conjunctiva/sclera: Conjunctivae normal.     Pupils: Pupils are equal, round, and reactive to light.  Cardiovascular:     Rate and Rhythm: Normal rate and regular rhythm.     Pulses: Normal pulses.     Heart sounds: Normal heart sounds. No murmur heard. No friction rub. No gallop.   Pulmonary:     Effort: Pulmonary effort is normal. No respiratory distress.     Breath sounds: Normal breath sounds. No stridor. No wheezing, rhonchi or rales.  Chest:     Chest wall: No tenderness.  Musculoskeletal:        General: Normal range of motion.     Cervical back: Normal range of motion and neck supple.     Comments: Negative Tinel's  and phalens bilaterally  Skin:    General: Skin is warm and dry.     Capillary Refill: Capillary refill takes less than 2 seconds.     Coloration: Skin is not jaundiced or pale.     Findings: No bruising, erythema, lesion or rash.  Neurological:     General: No focal deficit present.     Mental Status: She is alert and oriented to person, place, and time. Mental status is at baseline.  Psychiatric:        Mood and Affect: Mood normal.        Behavior: Behavior normal.        Thought Content: Thought content normal.        Judgment: Judgment normal.     Results for orders placed or performed during the hospital encounter of 04/25/20  Urine Culture   Specimen: Urine, Random  Result Value Ref Range   Specimen Description      URINE, RANDOM Performed at [facility],  [address], [place], [place] [address]    Special Requests      NONE Performed at [facility], [address], [place], [place] [address]    Culture (A)     <10,000 COLONIES/mL INSIGNIFICANT GROWTH Performed at [facility] [address] [place], [place] [address]    Report Status 04/26/2020 FINAL   Urinalysis, Complete w Microscopic  Result Value Ref Range   Color, Urine YELLOW (A) YELLOW   APPearance HAZY (A) CLEAR   Specific Gravity, Urine 1.018 1.005 - 1.030   pH 5.0 5.0 - 8.0   Glucose, UA NEGATIVE NEGATIVE mg/dL   Hgb urine dipstick MODERATE (A) NEGATIVE   Bilirubin Urine NEGATIVE NEGATIVE   Ketones, ur NEGATIVE NEGATIVE mg/dL   Protein, ur NEGATIVE NEGATIVE mg/dL   Nitrite NEGATIVE NEGATIVE   Leukocytes,Ua NEGATIVE NEGATIVE   RBC / HPF 21-50 0 - 5 RBC/hpf   WBC, UA 0-5 0 - 5 WBC/hpf   Bacteria, UA NONE SEEN NONE SEEN   Squamous Epithelial / LPF 0-5 0 - 5   Mucus PRESENT   Basic metabolic panel  Result Value Ref Range   Sodium 139 135 - 145 mmol/L   Potassium 3.8 3.5 - 5.1 mmol/L   Chloride 108 98 - 111 mmol/L   CO2 22 22 - 32 mmol/L   Glucose, Bld 88 70 - 99 mg/dL   BUN 10 6 - 20 mg/dL   Creatinine, Ser 1.13 (H) 0.44 - 1.00 mg/dL   Calcium 9.3 8.9 - 10.3 mg/dL   GFR, Estimated >60 >60 mL/min   Anion gap 9 5 - 15  CBC  Result Value Ref Range   WBC 7.2 4.0 - 10.5 K/uL   RBC 4.46 3.87 - 5.11 MIL/uL   Hemoglobin 14.5 12.0 - 15.0 g/dL   HCT 41.0 36.0 - 46.0 %   MCV 91.9 80.0 - 100.0 fL   MCH 32.5 26.0 - 34.0 pg   MCHC 35.4 30.0 - 36.0 g/dL   RDW 11.9 11.5 - 15.5 %   Platelets 369 150 - 400 K/uL   nRBC 0.0 0.0 - 0.2 %  Pregnancy, urine POC  Result Value Ref Range   Preg Test, Ur NEGATIVE NEGATIVE      Assessment & Plan:   Problem List Items Addressed This Visit   None   Visit Diagnoses    Numbness and tingling in right hand    -  Primary   Will check labs  and and cervical x-ray. Await results. Will get her into neurology for  evaluation, may need EMG.    Relevant Orders   TSH   B12   VITAMIN D 25 Hydroxy (Vit-D Deficiency, Fractures)   Comprehensive metabolic panel   DG Cervical Spine Complete   Ambulatory referral to Neurology       Follow up plan: Return if symptoms worsen or fail to improve.

## 2020-07-11 ENCOUNTER — Encounter: Payer: Self-pay | Admitting: Family Medicine

## 2020-07-11 ENCOUNTER — Other Ambulatory Visit: Payer: Commercial Managed Care - PPO

## 2020-08-02 ENCOUNTER — Other Ambulatory Visit: Payer: Self-pay

## 2020-08-02 ENCOUNTER — Telehealth (INDEPENDENT_AMBULATORY_CARE_PROVIDER_SITE_OTHER): Payer: Commercial Managed Care - PPO | Admitting: Psychiatry

## 2020-08-02 DIAGNOSIS — Z5329 Procedure and treatment not carried out because of patient's decision for other reasons: Secondary | ICD-10-CM

## 2020-08-02 NOTE — Progress Notes (Signed)
No response to call or text or video invite  

## 2020-08-24 ENCOUNTER — Ambulatory Visit
Admission: EM | Admit: 2020-08-24 | Discharge: 2020-08-24 | Disposition: A | Discharge status: Home / Self Care | Payer: Commercial Managed Care - PPO

## 2020-08-24 ENCOUNTER — Other Ambulatory Visit: Payer: Self-pay

## 2020-08-24 DIAGNOSIS — H01004 Unspecified blepharitis left upper eyelid: Secondary | ICD-10-CM

## 2020-08-24 NOTE — ED Triage Notes (Signed)
Pt c/o left eye swelling, itchiness, irritation onset yesterday. Worse this morning upon waking with some clear drainage.  Denies sore throat, congestion, runny nose, ear pain, fever, change to vision, new eye makeup use. Took Aleve at 0900 Upper eyelid with edema and erythema.

## 2020-08-24 NOTE — ED Provider Notes (Signed)
Gabrielle Jackson    CSN: [id number] Arrival date & time: 08/24/20  1311      History   Chief Complaint Chief Complaint  Patient presents with  . Eye Problem    HPI Gabrielle Jackson is a 30 y.o. female.   Gabrielle Jackson is a 30 year old with left eye swelling, pain, and itching that started yesterday.  Patient reports that the swelling was worse this am.  Patient denies any pain with eye movement, discharge, or visual changes.  Patient denies using any new cosmetics.  Patient has used aleve with minimal relief.  Denies any fevers.   The history is provided by the patient.  Eye Problem Location:  Left eye Quality:  Stinging and burning Severity:  Mild Onset quality:  Gradual Progression:  Partially resolved Chronicity:  New Context: not burn, not chemical exposure, not direct trauma, not scratch and not smoke exposure   Relieved by:  NSAIDs Worsened by:  Nothing Ineffective treatments:  None tried Associated symptoms: itching   Associated symptoms: no blurred vision, no crusting, no decreased vision, no discharge, no double vision, no headaches, no nausea, no numbness, no photophobia and no redness   Risk factors: no conjunctival hemorrhage, no exposure to pinkeye, no previous injury to eye and no recent herpes zoster     Past Medical History:  Diagnosis Date  . Anxiety disorder   . Breast mass 05/02/2010   lt breast pain  . Car sickness   . Depression   . Dysmenorrhea   . Ovarian cyst   . Velamentous insertion of umbilical cord     Patient Active Problem List   Diagnosis Date Noted  . Cellulitis of finger of right hand 02/16/2020  . MDD (major depressive disorder), single episode, in partial remission ([place]) 05/12/2019  . Social anxiety disorder 04/01/2019  . Bereavement 03/13/2019  . Depressive disorder 02/13/2019  . GAD (generalized anxiety disorder) 09/05/2015  . Partial hamstring tear 05/02/2015    Past Surgical History:  Procedure Laterality  Date  . TYMPANOPLASTY Right 03/21/2018   Procedure: TYMPANOPLASTY WITH GRAFT;  Surgeon: Beverly Gust, MD;  Location: [facility];  Service: ENT;  Laterality: Right;    OB History    Gravida  2   Para  2   Term  2   Preterm      AB  0   Living  2     SAB  0   IAB      Ectopic      Multiple      Live Births  2            Home Medications    Prior to Admission medications   Medication Sig Start Date End Date Taking? Authorizing Provider  venlafaxine XR (EFFEXOR XR) 75 MG 24 hr capsule Take 1 capsule (75 mg total) by mouth daily with breakfast. 03/22/20  Yes Johnson, Megan P, DO  etonogestrel-ethinyl estradiol (NUVARING) 0.12-0.015 MG/24HR vaginal ring INSERT 1 RING VAGINALLY AS DIRECTED. REMOVE AFTER 3 WEEKS & WAIT 7 DAYS BEFORE INSERTING A NEW RING 08/11/20   [provider]    Family History Family History  Problem Relation Age of Onset  . Depression Mother   . Leukemia Mother   . Hypertension Father   . Depression Maternal Grandmother   . COPD Maternal Grandfather   . COPD Paternal Grandmother   . Lung cancer Paternal Grandmother     Social History Social History   Tobacco Use  .  Smoking status: Never Smoker  . Smokeless tobacco: Never Used  Vaping Use  . Vaping Use: Never used  Substance Use Topics  . Alcohol use: Yes    Alcohol/week: 0.0 standard drinks    Comment: On occasion  . Drug use: No     Allergies   Penicillins   Review of Systems Review of Systems  Constitutional: Negative for fever.  HENT: Positive for facial swelling.   Eyes: Positive for pain and itching. Negative for blurred vision, double vision, photophobia, discharge, redness and visual disturbance.  Gastrointestinal: Negative for nausea.  Neurological: Negative for numbness and headaches.     Physical Exam Triage Vital Signs ED Triage Vitals  Enc Vitals Group     BP 08/24/20 1326 125/85     Pulse Rate 08/24/20 1326 76     Resp 08/24/20  1326 16     Temp 08/24/20 1326 98.7 F (37.1 C)     Temp Source 08/24/20 1326 Oral     SpO2 08/24/20 1326 98 %     Weight --      Height --      Head Circumference --      Peak Flow --      Pain Score 08/24/20 1322 6     Pain Loc --      Pain Edu? --      Excl. in [place]? --    No data found.  Updated Vital Signs BP 125/85 (BP Location: Left Arm)   Pulse 76   Temp 98.7 F (37.1 C) (Oral)   Resp 16   LMP 08/10/2020 (Approximate)   SpO2 98%   Visual Acuity Right Eye Distance:   Left Eye Distance:   Bilateral Distance:    Right Eye Near:   Left Eye Near:    Bilateral Near:     Physical Exam Vitals and nursing note reviewed.  Constitutional:      General: She is not in acute distress.    Appearance: She is not ill-appearing or diaphoretic.  HENT:     Head: Normocephalic and atraumatic.     Nose: No congestion.  Eyes:     General: Vision grossly intact. No visual field deficit.       Left eye: Hordeolum present.No foreign body or discharge.     Extraocular Movements:     Left eye: Normal extraocular motion and no nystagmus.     Conjunctiva/sclera: Conjunctivae normal.  Cardiovascular:     Pulses: Normal pulses.  Pulmonary:     Effort: Pulmonary effort is normal.  Musculoskeletal:        General: Normal range of motion.  Skin:    General: Skin is warm and dry.  Neurological:     Mental Status: She is alert and oriented to person, place, and time.  Psychiatric:        Mood and Affect: Mood normal.      UC Treatments / Results  Labs (all labs ordered are listed, but only abnormal results are displayed) Labs Reviewed - No data to display  EKG   Radiology No results found.  Procedures Procedures (including critical care time)  Medications Ordered in UC Medications - No data to display  Initial Impression / Assessment and Plan / UC Course  I have reviewed the triage vital signs and the nursing notes.  Pertinent labs & imaging results that were  available during my care of the patient were reviewed by me and considered in my medical decision making (see chart  for details).     Blepharitis.  Assessment negative for red flags or concerns, including periorbital cellulitis.   Use warm compresses and scrub lashes.    Final Clinical Impressions(s) / UC Diagnoses   Final diagnoses:  Blepharitis of left upper eyelid, unspecified type     Discharge Instructions     You likely have blepharitis.   Use a warm compress.  Scrub lashes when washing face (you can use Johnson's Baby Shampoo to clean your lashes as well).   Return or go to the Emergency Department if symptoms worsen or do not improve in the next few days.      ED Prescriptions    None     PDMP not reviewed this encounter.   Pearson Forster, NP 08/24/20 [phone]

## 2020-08-24 NOTE — Discharge Instructions (Signed)
You likely have blepharitis.   Use a warm compress.  Scrub lashes when washing face (you can use Johnson's Baby Shampoo to clean your lashes as well).   Return or go to the Emergency Department if symptoms worsen or do not improve in the next few days.

## 2020-08-26 ENCOUNTER — Ambulatory Visit: Payer: Commercial Managed Care - PPO | Admitting: Family Medicine

## 2020-09-16 ENCOUNTER — Other Ambulatory Visit: Payer: Self-pay | Admitting: Obstetrics and Gynecology

## 2020-09-16 ENCOUNTER — Telehealth: Payer: Self-pay

## 2020-09-16 DIAGNOSIS — Z3044 Encounter for surveillance of vaginal ring hormonal contraceptive device: Secondary | ICD-10-CM

## 2020-09-16 MED ORDER — ETONOGESTREL-ETHINYL ESTRADIOL 0.12-0.015 MG/24HR VA RING: VAGINAL_RING | VAGINAL | 11 refills | Status: DC

## 2020-09-16 NOTE — Telephone Encounter (Signed)
Refill request for nuvaring recv'd from [facility]. Spero Geralds;  [phone]

## 2020-09-16 NOTE — Telephone Encounter (Signed)
Patient has not had a follow up from her ultrasound last year as far as I can tell. Can you ask her about that? Do let her know that I provided her with a refill on the NuvaRing.

## 2020-09-19 NOTE — Telephone Encounter (Signed)
Left detailed msg for pt to call and let us know about the u/s and ref eRx'd.

## 2020-09-22 NOTE — Telephone Encounter (Signed)
Have left two MyChart msgs in the last week.

## 2020-09-23 ENCOUNTER — Other Ambulatory Visit: Payer: Self-pay

## 2020-09-23 ENCOUNTER — Ambulatory Visit
Admission: RE | Admit: 2020-09-23 | Discharge: 2020-09-23 | Disposition: A | Discharge status: Home / Self Care | Payer: Commercial Managed Care - PPO | Source: Ambulatory Visit | Attending: Family Medicine | Admitting: Family Medicine

## 2020-09-23 VITALS — BP 114/83 | HR 63 | Temp 98.4°F | Resp 15

## 2020-09-23 DIAGNOSIS — N3090 Cystitis, unspecified without hematuria: Secondary | ICD-10-CM | POA: Diagnosis present

## 2020-09-23 LAB — POCT URINALYSIS DIP (MANUAL ENTRY)
Bilirubin, UA: NEGATIVE
Glucose, UA: NEGATIVE mg/dL
Ketones, POC UA: NEGATIVE mg/dL
Nitrite, UA: NEGATIVE
Protein Ur, POC: 30 mg/dL — AB
Spec Grav, UA: 1.02 (ref 1.010–1.025)
Urobilinogen, UA: 0.2 E.U./dL
pH, UA: 6 (ref 5.0–8.0)

## 2020-09-23 MED ORDER — SULFAMETHOXAZOLE-TRIMETHOPRIM 800-160 MG PO TABS: 1.0000 | ORAL_TABLET | Freq: Two times a day (BID) | ORAL | 0 refills | Status: AC

## 2020-09-23 NOTE — ED Triage Notes (Addendum)
Patient c/o urinary retention and dysuria x 2 weeks.  Patient endorses RT sided back pain originating from ABD. Patient states "back pain recently started".  Patient denies fever and hematuria.   Patient endorses foul smell.    Patient denies abnormal vaginal discharge.   Patient has history of kidney stones.   Patient has increased fluid intake with no relief of symptoms.

## 2020-09-23 NOTE — ED Provider Notes (Signed)
Roderic Palau    CSN: [id number] Arrival date & time: 09/23/20  1155      History   Chief Complaint Chief Complaint  Patient presents with  . Urinary Retention  . Dysuria    HPI Gabrielle Jackson is a 30 y.o. female.   Pt is a 30 year old female that presents with urinary retention and dysuria x 2 weeks. Complaining of right sided back pain originating from lower abdomen.  The back pain started yesterday. No fever, N,V. No vaginal discharge, itching or irritation. Patient's last menstrual period was 09/02/2020. Hx of kidney stones. Has increased fluids.        Dysuria   Past Medical History:  Diagnosis Date  . Anxiety disorder   . Breast mass 05/02/2010   lt breast pain  . Car sickness   . Depression   . Dysmenorrhea   . Ovarian cyst   . Velamentous insertion of umbilical cord     Patient Active Problem List   Diagnosis Date Noted  . Cellulitis of finger of right hand 02/16/2020  . MDD (major depressive disorder), single episode, in partial remission ([place]) 05/12/2019  . Social anxiety disorder 04/01/2019  . Bereavement 03/13/2019  . Depressive disorder 02/13/2019  . GAD (generalized anxiety disorder) 09/05/2015  . Partial hamstring tear 05/02/2015    Past Surgical History:  Procedure Laterality Date  . TYMPANOPLASTY Right 03/21/2018   Procedure: TYMPANOPLASTY WITH GRAFT;  Surgeon: Beverly Gust, MD;  Location: [facility];  Service: ENT;  Laterality: Right;    OB History    Gravida  2   Para  2   Term  2   Preterm      AB  0   Living  2     SAB  0   IAB      Ectopic      Multiple      Live Births  2            Home Medications    Prior to Admission medications   Medication Sig Start Date End Date Taking? Authorizing Provider  sulfamethoxazole-trimethoprim (BACTRIM DS) 800-160 MG tablet Take 1 tablet by mouth 2 (two) times daily for 7 days. 09/23/20 09/30/20 Yes Dwon Sky A, NP  etonogestrel-ethinyl  estradiol (NUVARING) 0.12-0.015 MG/24HR vaginal ring INSERT 1 RING VAGINALLY AS DIRECTED. REMOVE AFTER 3 WEEKS & WAIT 7 DAYS BEFORE INSERTING A NEW RING 09/16/20   Will Bonnet, MD  venlafaxine XR (EFFEXOR XR) 75 MG 24 hr capsule Take 1 capsule (75 mg total) by mouth daily with breakfast. 03/22/20   Valerie Roys, DO    Family History Family History  Problem Relation Age of Onset  . Depression Mother   . Leukemia Mother   . Hypertension Father   . Depression Maternal Grandmother   . COPD Maternal Grandfather   . COPD Paternal Grandmother   . Lung cancer Paternal Grandmother     Social History Social History   Tobacco Use  . Smoking status: Never Smoker  . Smokeless tobacco: Never Used  Vaping Use  . Vaping Use: Never used  Substance Use Topics  . Alcohol use: Yes    Alcohol/week: 0.0 standard drinks    Comment: On occasion  . Drug use: No     Allergies   Penicillins   Review of Systems Review of Systems  Genitourinary: Positive for dysuria.     Physical Exam Triage Vital Signs ED Triage Vitals  Enc Vitals Group  BP 09/23/20 1214 114/83     Pulse Rate 09/23/20 1214 63     Resp 09/23/20 1214 15     Temp 09/23/20 1214 98.4 F (36.9 C)     Temp src --      SpO2 09/23/20 1214 98 %     Weight --      Height --      Head Circumference --      Peak Flow --      Pain Score 09/23/20 1212 3     Pain Loc --      Pain Edu? --      Excl. in [place]? --    No data found.  Updated Vital Signs BP 114/83 (BP Location: Left Arm)   Pulse 63   Temp 98.4 F (36.9 C)   Resp 15   LMP 09/02/2020   SpO2 98%   Visual Acuity Right Eye Distance:   Left Eye Distance:   Bilateral Distance:    Right Eye Near:   Left Eye Near:    Bilateral Near:     Physical Exam Vitals and nursing note reviewed.  Constitutional:      General: She is not in acute distress.    Appearance: Normal appearance. She is not ill-appearing, toxic-appearing or diaphoretic.  HENT:      Head: Normocephalic.  Eyes:     Conjunctiva/sclera: Conjunctivae normal.  Pulmonary:     Effort: Pulmonary effort is normal.  Musculoskeletal:        General: Normal range of motion.     Cervical back: Normal range of motion.  Skin:    General: Skin is warm and dry.     Findings: No rash.  Neurological:     Mental Status: She is alert.  Psychiatric:        Mood and Affect: Mood normal.      UC Treatments / Results  Labs (all labs ordered are listed, but only abnormal results are displayed) Labs Reviewed  POCT URINALYSIS DIP (MANUAL ENTRY) - Abnormal; Notable for the following components:      Result Value   Clarity, UA cloudy (*)    Blood, UA moderate (*)    Protein Ur, POC =30 (*)    Leukocytes, UA Large (3+) (*)    All other components within normal limits  URINE CULTURE    EKG   Radiology No results found.  Procedures Procedures (including critical care time)  Medications Ordered in UC Medications - No data to display  Initial Impression / Assessment and Plan / UC Course  I have reviewed the triage vital signs and the nursing notes.  Pertinent labs & imaging results that were available during my care of the patient were reviewed by me and considered in my medical decision making (see chart for details).     Cystitis Urine with large leuks and blood. Sending for culture.  Treating for UTI pending culture.  Push fluids.  Follow up as needed for continued or worsening symptoms  Final Clinical Impressions(s) / UC Diagnoses   Final diagnoses:  Cystitis     Discharge Instructions     Treating you for a urinary tract infection Take the antibiotics as prescribed Drink plenty of fluids.  Follow up as needed for continued or worsening symptoms     ED Prescriptions    Medication Sig Dispense Auth. Provider   sulfamethoxazole-trimethoprim (BACTRIM DS) 800-160 MG tablet Take 1 tablet by mouth 2 (two) times daily for 7 days. 14 tablet  Gabrielle July,  NP     PDMP not reviewed this encounter.   Gabrielle July, NP 09/23/20 [phone]

## 2020-09-23 NOTE — Discharge Instructions (Addendum)
Treating you for a urinary tract infection Take the antibiotics as prescribed Drink plenty of fluids.  Follow up as needed for continued or worsening symptoms

## 2020-09-25 LAB — URINE CULTURE: Culture: 80000 — AB

## 2020-10-31 ENCOUNTER — Encounter: Payer: Self-pay | Admitting: Family Medicine

## 2020-11-01 ENCOUNTER — Other Ambulatory Visit (HOSPITAL_COMMUNITY)
Admission: RE | Admit: 2020-11-01 | Discharge: 2020-11-01 | Disposition: A | Discharge status: Home / Self Care | Payer: Commercial Managed Care - PPO | Source: Ambulatory Visit | Attending: Obstetrics and Gynecology | Admitting: Obstetrics and Gynecology

## 2020-11-01 ENCOUNTER — Encounter: Payer: Self-pay | Admitting: Obstetrics and Gynecology

## 2020-11-01 ENCOUNTER — Ambulatory Visit (INDEPENDENT_AMBULATORY_CARE_PROVIDER_SITE_OTHER): Payer: Commercial Managed Care - PPO | Admitting: Obstetrics and Gynecology

## 2020-11-01 ENCOUNTER — Other Ambulatory Visit: Payer: Self-pay

## 2020-11-01 VITALS — BP 100/70 | Ht 67.0 in | Wt 148.0 lb

## 2020-11-01 DIAGNOSIS — Z113 Encounter for screening for infections with a predominantly sexual mode of transmission: Secondary | ICD-10-CM

## 2020-11-01 DIAGNOSIS — Z124 Encounter for screening for malignant neoplasm of cervix: Secondary | ICD-10-CM | POA: Insufficient documentation

## 2020-11-01 DIAGNOSIS — R238 Other skin changes: Secondary | ICD-10-CM

## 2020-11-01 DIAGNOSIS — R875 Abnormal microbiological findings in specimens from female genital organs: Secondary | ICD-10-CM

## 2020-11-01 DIAGNOSIS — N760 Acute vaginitis: Secondary | ICD-10-CM | POA: Diagnosis not present

## 2020-11-01 DIAGNOSIS — R233 Spontaneous ecchymoses: Secondary | ICD-10-CM

## 2020-11-01 DIAGNOSIS — R040 Epistaxis: Secondary | ICD-10-CM

## 2020-11-01 MED ORDER — CLINDAMYCIN HCL 300 MG PO CAPS: 300.0000 mg | ORAL_CAPSULE | Freq: Two times a day (BID) | ORAL | 0 refills | Status: DC

## 2020-11-01 NOTE — Progress Notes (Signed)
Patient ID: Gabrielle Jackson, female   DOB: 1991/01/16, 30 y.o.   MRN: [id number]  Reason for Consult: Vaginitis   Referred by Gabrielle Roys, DO  Subjective:     HPI:  Gabrielle Jackson is a 30 y.o. female. She reports that she has been having vaginal itching and discharge. She is concerned she may have an infection. She used a OTC monistat without improvement.   She additionally reports that for the last 6 months she has been experiencing easy bruising and frequent nose bleeds. Her bruising happens even without he remembering bumping into anything. A recent nose bleed she struggled to resolve.    Past Medical History:  Diagnosis Date  . Anxiety disorder   . Breast mass 05/02/2010   lt breast pain  . Car sickness   . Depression   . Dysmenorrhea   . Ovarian cyst   . Velamentous insertion of umbilical cord    Family History  Problem Relation Age of Onset  . Depression Mother   . Leukemia Mother   . Hypertension Father   . Depression Maternal Grandmother   . COPD Maternal Grandfather   . COPD Paternal Grandmother   . Lung cancer Paternal Grandmother    Past Surgical History:  Procedure Laterality Date  . TYMPANOPLASTY Right 03/21/2018   Procedure: TYMPANOPLASTY WITH GRAFT;  Surgeon: Beverly Gust, MD;  Location: [facility];  Service: ENT;  Laterality: Right;    Short Social History:  Social History   Tobacco Use  . Smoking status: Never Smoker  . Smokeless tobacco: Never Used  Substance Use Topics  . Alcohol use: Yes    Alcohol/week: 0.0 standard drinks    Comment: On occasion    Allergies  Allergen Reactions  . Penicillins Rash    Current Outpatient Medications  Medication Sig Dispense Refill  . etonogestrel-ethinyl estradiol (NUVARING) 0.12-0.015 MG/24HR vaginal ring INSERT 1 RING VAGINALLY AS DIRECTED. REMOVE AFTER 3 WEEKS & WAIT 7 DAYS BEFORE INSERTING A NEW RING 3 each 11  . venlafaxine XR (EFFEXOR XR) 75 MG 24 hr capsule Take 1  capsule (75 mg total) by mouth daily with breakfast. 90 capsule 1   No current facility-administered medications for this visit.    Review of Systems  Constitutional: Negative for chills, fatigue, fever and unexpected weight change.  HENT: Negative for trouble swallowing.  Eyes: Negative for loss of vision.  Respiratory: Negative for cough, shortness of breath and wheezing.  Cardiovascular: Negative for chest pain, leg swelling, palpitations and syncope.  GI: Negative for abdominal pain, blood in stool, diarrhea, nausea and vomiting.  GU: Negative for difficulty urinating, dysuria, frequency and hematuria.  Musculoskeletal: Negative for back pain, leg pain and joint pain.  Skin: Negative for rash.  Neurological: Negative for dizziness, headaches, light-headedness, numbness and seizures.  Hematologic: Positive for bruises/bleeds easily.  Psychiatric: Negative for behavioral problem, confusion, depressed mood and sleep disturbance.        Objective:  Objective   Vitals:   11/01/20 0931  BP: 100/70  Weight: 148 lb (67.1 kg)  Height: 5\' 7"  (1.702 m)   Body mass index is 23.18 kg/m.  Physical Exam Vitals and nursing note reviewed. Exam conducted with a chaperone present.  Constitutional:      Appearance: Normal appearance. She is well-developed.  HENT:     Head: Normocephalic and atraumatic.  Eyes:     Extraocular Movements: Extraocular movements intact.     Pupils: Pupils are equal, round, and reactive to  light.  Cardiovascular:     Rate and Rhythm: Normal rate and regular rhythm.  Pulmonary:     Effort: Pulmonary effort is normal. No respiratory distress.     Breath sounds: Normal breath sounds.  Abdominal:     General: Abdomen is flat.     Palpations: Abdomen is soft.  Genitourinary:    Comments: External: Normal appearing vulva. No lesions noted.  Speculum examination: Normal appearing cervix. No blood in the vaginal vault. Thin discharge.   Bimanual examination:  Uterus midline, non-tender, normal in size, shape and contour.  No CMT. No adnexal masses. No adnexal tenderness. Pelvis not fixed.  Musculoskeletal:        General: No signs of injury.  Skin:    General: Skin is warm and dry.  Neurological:     Mental Status: She is alert and oriented to person, place, and time.  Psychiatric:        Behavior: Behavior normal.        Thought Content: Thought content normal.        Judgment: Judgment normal.     Wet Prep: Clue Cells: Positive Fungal elements: Negative Trichomonas: Negative   Assessment/Plan:     30 yo 1. Vaginitis- Will treat for Bacterial vaginosis. Nuswab for confirmation.  2. Easy bruising and nose bleeds- will check cbc and PT/ PTT/ INR. Recommended follow up with her PCP for further evaluation.  3. Cervical cancer screening- Pap smear today  More than 20 minutes were spent face to face with the patient in the room, reviewing the medical record, labs and images, and coordinating care for the patient. The plan of management was discussed in detail and counseling was provided.   Adrian Prows MD [facility], [facility] 11/01/2020 9:38 AM

## 2020-11-02 LAB — CBC WITH DIFFERENTIAL
Basophils Absolute: 0 10*3/uL (ref 0.0–0.2)
Basos: 0 %
EOS (ABSOLUTE): 0 10*3/uL (ref 0.0–0.4)
Eos: 1 %
Hematocrit: 41.2 % (ref 34.0–46.6)
Hemoglobin: 13.9 g/dL (ref 11.1–15.9)
Immature Grans (Abs): 0 10*3/uL (ref 0.0–0.1)
Immature Granulocytes: 0 %
Lymphocytes Absolute: 1.7 10*3/uL (ref 0.7–3.1)
Lymphs: 26 %
MCH: 32.2 pg (ref 26.6–33.0)
MCHC: 33.7 g/dL (ref 31.5–35.7)
MCV: 95 fL (ref 79–97)
Monocytes Absolute: 0.5 10*3/uL (ref 0.1–0.9)
Monocytes: 7 %
Neutrophils Absolute: 4.4 10*3/uL (ref 1.4–7.0)
Neutrophils: 66 %
RBC: 4.32 x10E6/uL (ref 3.77–5.28)
RDW: 11.8 % (ref 11.7–15.4)
WBC: 6.7 10*3/uL (ref 3.4–10.8)

## 2020-11-02 LAB — APTT: aPTT: 26 s (ref 24–33)

## 2020-11-02 LAB — PROTIME-INR
INR: 0.9 (ref 0.9–1.2)
Prothrombin Time: 9.8 s (ref 9.1–12.0)

## 2020-11-02 NOTE — Telephone Encounter (Signed)
Pt has refills.

## 2020-11-03 ENCOUNTER — Other Ambulatory Visit: Payer: Self-pay | Admitting: Family Medicine

## 2020-11-03 DIAGNOSIS — F411 Generalized anxiety disorder: Secondary | ICD-10-CM

## 2020-11-03 DIAGNOSIS — F324 Major depressive disorder, single episode, in partial remission: Secondary | ICD-10-CM

## 2020-11-03 DIAGNOSIS — F4010 Social phobia, unspecified: Secondary | ICD-10-CM

## 2020-11-03 LAB — CYTOLOGY - PAP
Comment: NEGATIVE
Diagnosis: NEGATIVE
High risk HPV: NEGATIVE

## 2020-11-04 LAB — NUSWAB VAGINITIS PLUS (VG+)
Atopobium vaginae: HIGH Score — AB
Candida albicans, NAA: NEGATIVE
Candida glabrata, NAA: NEGATIVE
Chlamydia trachomatis, NAA: NEGATIVE
Megasphaera 1: HIGH Score — AB
Neisseria gonorrhoeae, NAA: NEGATIVE
Trich vag by NAA: NEGATIVE

## 2020-11-08 ENCOUNTER — Encounter: Payer: Self-pay | Admitting: Family Medicine

## 2020-11-10 ENCOUNTER — Other Ambulatory Visit: Payer: Self-pay | Admitting: Family Medicine

## 2020-11-10 ENCOUNTER — Encounter: Payer: Self-pay | Admitting: Family Medicine

## 2020-11-10 ENCOUNTER — Other Ambulatory Visit: Payer: Self-pay

## 2020-11-10 ENCOUNTER — Ambulatory Visit: Payer: Commercial Managed Care - PPO | Admitting: Dermatology

## 2020-11-10 ENCOUNTER — Ambulatory Visit: Payer: Commercial Managed Care - PPO | Admitting: Family Medicine

## 2020-11-10 VITALS — BP 125/85 | HR 67 | Temp 97.9°F | Wt 148.0 lb

## 2020-11-10 DIAGNOSIS — H7291 Unspecified perforation of tympanic membrane, right ear: Secondary | ICD-10-CM

## 2020-11-10 DIAGNOSIS — T148XXA Other injury of unspecified body region, initial encounter: Secondary | ICD-10-CM | POA: Diagnosis not present

## 2020-11-10 DIAGNOSIS — R5383 Other fatigue: Secondary | ICD-10-CM | POA: Diagnosis not present

## 2020-11-10 NOTE — Progress Notes (Signed)
BP 125/85   Pulse 67   Temp 97.9 F (36.6 C)   Wt 148 lb (67.1 kg)   SpO2 99%   BMI 23.18 kg/m    Subjective:    Patient ID: Gabrielle Jackson, female    DOB: 11-Jan-1991, 30 y.o.   MRN: [id number]  HPI: Gabrielle Jackson is a 30 y.o. female  Chief Complaint  Patient presents with  . Fatigue    Patient states she has been feeling very fatigue lately and noticed bruising    FATIGUE Duration:  4 months Severity: severe  Onset: gradual Context when symptoms started:  COVID Symptoms improve with rest: no  Depressive symptoms: no Stress/anxiety: no Insomnia: no  Snoring: no Observed apnea by bed partner: no Daytime hypersomnolence:no Wakes feeling refreshed: no History of sleep study: no Dysnea on exertion:  no Orthopnea/PND: no Chest pain: no Chronic cough: no Lower extremity edema: no Arthralgias:no Myalgias: no Weakness: no Rash: no Depression screen [facility] 2/9 11/10/2020 07/08/2020 03/22/2020 12/16/2018 07/09/2018  Decreased Interest 0 0 0 1 0  Down, Depressed, Hopeless 0 1 0 3 0  PHQ - 2 Score 0 1 0 4 0  Altered sleeping 0 0 0 3 0  Tired, decreased energy 0 0 0 3 3  Change in appetite 0 0 0 3 0  Feeling bad or failure about yourself  0 0 0 3 0  Trouble concentrating 0 0 0 3 0  Moving slowly or fidgety/restless 0 0 0 0 0  Suicidal thoughts 0 0 0 0 0  PHQ-9 Score 0 1 0 19 3  Difficult doing work/chores - Not difficult at all Not difficult at all Very difficult Not difficult at all  Some encounter information is confidential and restricted. Go to Review Flowsheets activity to see all data.  Some recent data might be hidden    Relevant past medical, surgical, family and social history reviewed and updated as indicated. Interim medical history since our last visit reviewed. Allergies and medications reviewed and updated.  Review of Systems  Constitutional: Positive for fatigue. Negative for activity change, appetite change, chills, diaphoresis, fever and  unexpected weight change.  HENT: Positive for congestion and ear pain. Negative for dental problem, drooling, ear discharge, facial swelling, hearing loss, mouth sores, nosebleeds, postnasal drip, rhinorrhea, sinus pressure, sinus pain, sneezing, sore throat, tinnitus, trouble swallowing and voice change.   Respiratory: Negative.   Cardiovascular: Negative.   Gastrointestinal: Negative.   Psychiatric/Behavioral: Negative.     Per HPI unless specifically indicated above     Objective:    BP 125/85   Pulse 67   Temp 97.9 F (36.6 C)   Wt 148 lb (67.1 kg)   SpO2 99%   BMI 23.18 kg/m   Wt Readings from Last 3 Encounters:  11/10/20 148 lb (67.1 kg)  11/01/20 148 lb (67.1 kg)  07/08/20 155 lb 6.4 oz (70.5 kg)    Physical Exam Vitals and nursing note reviewed.  Constitutional:      General: She is not in acute distress.    Appearance: Normal appearance. She is not ill-appearing, toxic-appearing or diaphoretic.  HENT:     Head: Normocephalic and atraumatic.     Right Ear: External ear normal.     Left Ear: External ear normal.     Nose: Nose normal.     Mouth/Throat:     Mouth: Mucous membranes are moist.     Pharynx: Oropharynx is clear.  Eyes:     General:  No scleral icterus.       Right eye: No discharge.        Left eye: No discharge.     Extraocular Movements: Extraocular movements intact.     Conjunctiva/sclera: Conjunctivae normal.     Pupils: Pupils are equal, round, and reactive to light.  Cardiovascular:     Rate and Rhythm: Normal rate and regular rhythm.     Pulses: Normal pulses.     Heart sounds: Normal heart sounds. No murmur heard. No friction rub. No gallop.   Pulmonary:     Effort: Pulmonary effort is normal. No respiratory distress.     Breath sounds: Normal breath sounds. No stridor. No wheezing, rhonchi or rales.  Chest:     Chest wall: No tenderness.  Musculoskeletal:        General: Normal range of motion.     Cervical back: Normal range of  motion and neck supple.  Skin:    General: Skin is warm and dry.     Capillary Refill: Capillary refill takes less than 2 seconds.     Coloration: Skin is not jaundiced or pale.     Findings: No bruising, erythema, lesion or rash.  Neurological:     General: No focal deficit present.     Mental Status: She is alert and oriented to person, place, and time. Mental status is at baseline.  Psychiatric:        Mood and Affect: Mood normal.        Behavior: Behavior normal.        Thought Content: Thought content normal.        Judgment: Judgment normal.     Results for orders placed or performed in visit on 11/01/20  NuSwab Vaginitis Plus (VG+)  Result Value Ref Range   Atopobium vaginae High - 2 (A) Score   BVAB 2 Low - 0 Score   Megasphaera 1 High - 2 (A) Score   Candida albicans, NAA Negative Negative   Candida glabrata, NAA Negative Negative   Trich vag by NAA Negative Negative   Chlamydia trachomatis, NAA Negative Negative   Neisseria gonorrhoeae, NAA Negative Negative  APTT  Result Value Ref Range   aPTT 26 24 - 33 sec  INR/PT  Result Value Ref Range   INR 0.9 0.9 - 1.2   Prothrombin Time 9.8 9.1 - 12.0 sec  CBC With Differential  Result Value Ref Range   WBC 6.7 3.4 - 10.8 x10E3/uL   RBC 4.32 3.77 - 5.28 x10E6/uL   Hemoglobin 13.9 11.1 - 15.9 g/dL   Hematocrit 41.2 34.0 - 46.6 %   MCV 95 79 - 97 fL   MCH 32.2 26.6 - 33.0 pg   MCHC 33.7 31.5 - 35.7 g/dL   RDW 11.8 11.7 - 15.4 %   Neutrophils 66 Not Estab. %   Lymphs 26 Not Estab. %   Monocytes 7 Not Estab. %   Eos 1 Not Estab. %   Basos 0 Not Estab. %   Neutrophils Absolute 4.4 1.4 - 7.0 x10E3/uL   Lymphocytes Absolute 1.7 0.7 - 3.1 x10E3/uL   Monocytes Absolute 0.5 0.1 - 0.9 x10E3/uL   EOS (ABSOLUTE) 0.0 0.0 - 0.4 x10E3/uL   Basophils Absolute 0.0 0.0 - 0.2 x10E3/uL   Immature Granulocytes 0 Not Estab. %   Immature Grans (Abs) 0.0 0.0 - 0.1 x10E3/uL  Cytology - PAP  Result Value Ref Range   High risk HPV  Negative    Adequacy  Satisfactory for evaluation; transformation zone component PRESENT.   Diagnosis      - Negative for intraepithelial lesion or malignancy (NILM)   Comment Normal Reference Range HPV - Negative       Assessment & Plan:   Problem List Items Addressed This Visit   None   Visit Diagnoses    Fatigue, unspecified type    -  Primary   CBC normal. Possibly post-covid. Will check labs. Await results. Treat as needed- if all normal will refer to post-covid clinic.   Relevant Orders   Thyroid Panel With TSH   VITAMIN D 25 Hydroxy (Vit-D Deficiency, Fractures)   Comprehensive metabolic panel   Mononucleosis screen   B12   Lyme Ab/Western Blot Reflex   Ehrlichia Antibody Panel   Rocky mtn spotted fvr abs pnl(IgG+IgM)   Babesia microti Antibody Panel   Bruising       Will check on labs. Await results.    Relevant Orders   von Willebrand Factor Screen   Perforation of right tympanic membrane       Will refer back to ENT. Call with any concerns.    Relevant Orders   Ambulatory referral to ENT       Follow up plan: Return if symptoms worsen or fail to improve.  >30 minutes spent with patient today.

## 2020-11-11 LAB — LYME DISEASE SEROLOGY W/REFLEX: Lyme Total Antibody EIA: NEGATIVE

## 2020-11-14 LAB — COMPREHENSIVE METABOLIC PANEL
ALT: 16 IU/L (ref 0–32)
AST: 15 IU/L (ref 0–40)
Albumin/Globulin Ratio: 1.4 (ref 1.2–2.2)
Albumin: 4.4 g/dL (ref 3.9–5.0)
Alkaline Phosphatase: 95 IU/L (ref 44–121)
BUN/Creatinine Ratio: 16 (ref 9–23)
BUN: 16 mg/dL (ref 6–20)
Bilirubin Total: 0.2 mg/dL (ref 0.0–1.2)
CO2: 20 mmol/L (ref 20–29)
Calcium: 9.4 mg/dL (ref 8.7–10.2)
Chloride: 104 mmol/L (ref 96–106)
Creatinine, Ser: 0.99 mg/dL (ref 0.57–1.00)
Globulin, Total: 3.1 g/dL (ref 1.5–4.5)
Glucose: 81 mg/dL (ref 65–99)
Potassium: 4.2 mmol/L (ref 3.5–5.2)
Sodium: 140 mmol/L (ref 134–144)
Total Protein: 7.5 g/dL (ref 6.0–8.5)
eGFR: 79 mL/min/{1.73_m2} (ref >59)

## 2020-11-14 LAB — EHRLICHIA ANTIBODY PANEL
E. Chaffeensis (HME) IgM Titer: NEGATIVE
E.Chaffeensis (HME) IgG: NEGATIVE
HGE IgG Titer: NEGATIVE
HGE IgM Titer: NEGATIVE

## 2020-11-14 LAB — ROCKY MTN SPOTTED FVR ABS PNL(IGG+IGM)
RMSF IgG: NEGATIVE
RMSF IgM: 0.4 index (ref 0.00–0.89)

## 2020-11-14 LAB — MONONUCLEOSIS SCREEN: Mono Screen: NEGATIVE

## 2020-11-14 LAB — VITAMIN D 25 HYDROXY (VIT D DEFICIENCY, FRACTURES): Vit D, 25-Hydroxy: 35 ng/mL (ref 30.0–100.0)

## 2020-11-14 LAB — THYROID PANEL WITH TSH
Free Thyroxine Index: 2.3 (ref 1.2–4.9)
T3 Uptake Ratio: 21 % — ABNORMAL LOW (ref 24–39)
T4, Total: 11.1 ug/dL (ref 4.5–12.0)
TSH: 1.19 u[IU]/mL (ref 0.450–4.500)

## 2020-11-14 LAB — VITAMIN B12: Vitamin B-12: 400 pg/mL (ref 232–1245)

## 2020-11-14 LAB — BABESIA MICROTI ANTIBODY PANEL
Babesia microti IgG: <1:10 {titer}
Babesia microti IgM: <1:10 {titer}

## 2020-12-01 ENCOUNTER — Ambulatory Visit: Payer: Commercial Managed Care - PPO | Admitting: Dermatology

## 2020-12-06 LAB — COAG STUDIES INTERP REPORT

## 2020-12-06 LAB — VON WILLEBRAND PANEL
Factor VIII Activity: 129 % (ref 56–140)
Von Willebrand Ag: 102 % (ref 50–200)
Von Willebrand Factor: 72 % (ref 50–200)

## 2020-12-06 LAB — SPECIMEN STATUS REPORT

## 2020-12-09 ENCOUNTER — Other Ambulatory Visit: Payer: Self-pay

## 2020-12-09 ENCOUNTER — Ambulatory Visit: Payer: Commercial Managed Care - PPO | Admitting: Dermatology

## 2020-12-09 DIAGNOSIS — D2239 Melanocytic nevi of other parts of face: Secondary | ICD-10-CM

## 2020-12-09 DIAGNOSIS — L578 Other skin changes due to chronic exposure to nonionizing radiation: Secondary | ICD-10-CM

## 2020-12-09 DIAGNOSIS — D485 Neoplasm of uncertain behavior of skin: Secondary | ICD-10-CM

## 2020-12-09 NOTE — Patient Instructions (Signed)
Wound Care Instructions  Cleanse wound gently with soap and water once a day then pat dry with clean gauze. Apply a thing coat of Petrolatum (petroleum jelly, "Vaseline") over the wound (unless you have an allergy to this). We recommend that you use a new, sterile tube of Vaseline. Do not pick or remove scabs. Do not remove the yellow or white "healing tissue" from the base of the wound.  Cover the wound with fresh, clean, nonstick gauze and secure with paper tape. You may use Band-Aids in place of gauze and tape if the would is small enough, but would recommend trimming much of the tape off as there is often too much. Sometimes Band-Aids can irritate the skin.  You should call the office for your biopsy report after 1 week if you have not already been contacted.  If you experience any problems, such as abnormal amounts of bleeding, swelling, significant bruising, significant pain, or evidence of infection, please call the office immediately.  FOR ADULT SURGERY PATIENTS: If you need something for pain relief you may take 1 extra strength Tylenol (acetaminophen) AND 2 Ibuprofen (200mg each) together every 4 hours as needed for pain. (do not take these if you are allergic to them or if you have a reason you should not take them.) Typically, you may only need pain medication for 1 to 3 days.   If you have any questions or concerns for your doctor, please call our main line at [phone] and press option 4 to reach your doctor's medical assistant. If no one answers, please leave a voicemail as directed and we will return your call as soon as possible. Messages left after 4 pm will be answered the following business day.   You may also send us a message via MyChart. We typically respond to MyChart messages within 1-2 business days.  For prescription refills, please ask your pharmacy to contact our office. Our fax number is [phone].  If you have an urgent issue when the clinic is closed that  cannot wait until the next business day, you can page your doctor at the number below.    Please note that while we do our best to be available for urgent issues outside of office hours, we are not available 24/7.   If you have an urgent issue and are unable to reach us, you may choose to seek medical care at your doctor's office, retail clinic, urgent care center, or emergency room.  If you have a medical emergency, please immediately call 911 or go to the emergency department.  Pager Numbers  - Dr. Kowalski: [phone]  - Dr. Moye: [phone]  - Dr. Stewart: [phone]  In the event of inclement weather, please call our main line at [phone] for an update on the status of any delays or closures.  Dermatology Medication Tips: Please keep the boxes that topical medications come in in order to help keep track of the instructions about where and how to use these. Pharmacies typically print the medication instructions only on the boxes and not directly on the medication tubes.   If your medication is too expensive, please contact our office at [phone] option 4 or send us a message through MyChart.   We are unable to tell what your co-pay for medications will be in advance as this is different depending on your insurance coverage. However, we may be able to find a substitute medication at lower cost or fill out paperwork to get insurance to cover a needed   medication.   If a prior authorization is required to get your medication covered by your insurance company, please allow us 1-2 business days to complete this process.  Drug prices often vary depending on where the prescription is filled and some pharmacies may offer cheaper prices.  The website www.goodrx.com contains coupons for medications through different pharmacies. The prices here do not account for what the cost may be with help from insurance (it may be cheaper with your insurance), but the website can give you the  price if you did not use any insurance.  - You can print the associated coupon and take it with your prescription to the pharmacy.  - You may also stop by our office during regular business hours and pick up a GoodRx coupon card.  - If you need your prescription sent electronically to a different pharmacy, notify our office through [place] MyChart or by phone at [phone] option 4.   

## 2020-12-09 NOTE — Progress Notes (Signed)
New Patient Visit  Subjective  Gabrielle Jackson is a 30 y.o. female who presents for the following: Other (Moles of right cheek - would like to see about having them removed).  The following portions of the chart were reviewed this encounter and updated as appropriate:   Tobacco  Allergies  Meds  Problems  Med Hx  Surg Hx  Fam Hx      Review of Systems:  No other skin or systemic complaints except as noted in HPI or Assessment and Plan.  Objective  Well appearing patient in no apparent distress; mood and affect are within normal limits.  A focused examination was performed including face. Relevant physical exam findings are noted in the Assessment and Plan.  Right medial cheek 0.4 cm light brown papule     Right cheek zygoma 0.6 cm light brown papule  Right cheek above mandible 0.5 cm light brown papule   Assessment & Plan  Neoplasm of uncertain behavior of skin (3) Right medial cheek  Epidermal / dermal shaving  Lesion diameter (cm):  0.4 Informed consent: discussed and consent obtained   Timeout: patient name, date of birth, surgical site, and procedure verified   Procedure prep:  Patient was prepped and draped in usual sterile fashion Prep type:  Isopropyl alcohol Anesthesia: the lesion was anesthetized in a standard fashion   Anesthetic:  1% lidocaine w/ epinephrine 1-100,000 buffered w/ 8.4% NaHCO3 Instrument used: flexible razor blade   Hemostasis achieved with: pressure, aluminum chloride and electrodesiccation   Outcome: patient tolerated procedure well   Post-procedure details: sterile dressing applied and wound care instructions given   Dressing type: bandage and petrolatum    Specimen 1 - Surgical pathology Differential Diagnosis: Irritated nevus R/O dysplasia Check Margins: No 0.4 cm light brown papule  Right cheek zygoma  Epidermal / dermal shaving  Lesion diameter (cm):  0.6 Informed consent: discussed and consent obtained    Timeout: patient name, date of birth, surgical site, and procedure verified   Procedure prep:  Patient was prepped and draped in usual sterile fashion Prep type:  Isopropyl alcohol Anesthesia: the lesion was anesthetized in a standard fashion   Anesthetic:  1% lidocaine w/ epinephrine 1-100,000 buffered w/ 8.4% NaHCO3 Instrument used: flexible razor blade   Hemostasis achieved with: pressure, aluminum chloride and electrodesiccation   Outcome: patient tolerated procedure well   Post-procedure details: sterile dressing applied and wound care instructions given   Dressing type: bandage and petrolatum    Specimen 2 - Surgical pathology Differential Diagnosis: Irritated nevus R/O dysplasia Check Margins: No 0.6 cm light brown papule  Right cheek above mandible  Epidermal / dermal shaving  Lesion diameter (cm):  0.5 Informed consent: discussed and consent obtained   Timeout: patient name, date of birth, surgical site, and procedure verified   Procedure prep:  Patient was prepped and draped in usual sterile fashion Prep type:  Isopropyl alcohol Anesthesia: the lesion was anesthetized in a standard fashion   Anesthetic:  1% lidocaine w/ epinephrine 1-100,000 buffered w/ 8.4% NaHCO3 Instrument used: flexible razor blade   Hemostasis achieved with: pressure, aluminum chloride and electrodesiccation   Outcome: patient tolerated procedure well   Post-procedure details: sterile dressing applied and wound care instructions given   Dressing type: bandage and petrolatum    Specimen 3 - Surgical pathology Differential Diagnosis: Irritated nevus R/O dysplasia Check Margins: No 0.5 cm light brown papule  Discussed risk of scar or repigmentation after shave removal.  Actinic Damage -  chronic, secondary to cumulative UV radiation exposure/sun exposure over time - diffuse scaly erythematous macules with underlying dyspigmentation - Recommend daily broad spectrum sunscreen SPF 30+ to  sun-exposed areas, reapply every 2 hours as needed.  - Recommend staying in the shade or wearing long sleeves, sun glasses (UVA+UVB protection) and wide brim hats (4-inch brim around the entire circumference of the hat). - Call for new or changing lesions.  Return if symptoms worsen or fail to improve.  I, Ashok Cordia, CMA, am acting as scribe for Sarina Ser, MD .  Documentation: I have reviewed the above documentation for accuracy and completeness, and I agree with the above.  Sarina Ser, MD

## 2020-12-13 ENCOUNTER — Encounter: Payer: Self-pay | Admitting: Dermatology

## 2020-12-19 ENCOUNTER — Telehealth: Payer: Self-pay

## 2020-12-19 NOTE — Telephone Encounter (Signed)
-----   Message from Ralene Bathe, MD sent at 12/19/2020  5:19 PM EDT ----- Diagnosis 1. Skin , right medial cheek MELANOCYTIC NEVUS, INTRADERMAL TYPE, BASE INVOLVED 2. Skin , right cheek zygoma MELANOCYTIC NEVUS, COMPOUND TYPE, WITH SCAR, BASE INVOLVED, SEE DESCRIPTION 3. Skin , right cheek above mandible MELANOCYTIC NEVUS, COMPOUND TYPE, BASE INVOLVED, SEE DESCRIPTION  1,2,3 - all three are benign moles No further treatment needed

## 2020-12-19 NOTE — Telephone Encounter (Signed)
Left message for patient to call office for results/hd 

## 2020-12-28 ENCOUNTER — Telehealth: Payer: Self-pay

## 2020-12-28 NOTE — Telephone Encounter (Signed)
-----   Message from Ralene Bathe, MD sent at 12/19/2020  5:19 PM EDT ----- Diagnosis 1. Skin , right medial cheek MELANOCYTIC NEVUS, INTRADERMAL TYPE, BASE INVOLVED 2. Skin , right cheek zygoma MELANOCYTIC NEVUS, COMPOUND TYPE, WITH SCAR, BASE INVOLVED, SEE DESCRIPTION 3. Skin , right cheek above mandible MELANOCYTIC NEVUS, COMPOUND TYPE, BASE INVOLVED, SEE DESCRIPTION  1,2,3 - all three are benign moles No further treatment needed

## 2020-12-28 NOTE — Telephone Encounter (Signed)
Left pt msg to call for bx results/sh 

## 2020-12-29 ENCOUNTER — Telehealth: Payer: Self-pay

## 2020-12-29 NOTE — Telephone Encounter (Signed)
Patient advised of BX results.  °

## 2020-12-29 NOTE — Telephone Encounter (Signed)
-----   Message from Ralene Bathe, MD sent at 12/19/2020  5:19 PM EDT ----- Diagnosis 1. Skin , right medial cheek MELANOCYTIC NEVUS, INTRADERMAL TYPE, BASE INVOLVED 2. Skin , right cheek zygoma MELANOCYTIC NEVUS, COMPOUND TYPE, WITH SCAR, BASE INVOLVED, SEE DESCRIPTION 3. Skin , right cheek above mandible MELANOCYTIC NEVUS, COMPOUND TYPE, BASE INVOLVED, SEE DESCRIPTION  1,2,3 - all three are benign moles No further treatment needed

## 2021-01-05 ENCOUNTER — Other Ambulatory Visit: Payer: Self-pay | Admitting: Family Medicine

## 2021-01-05 DIAGNOSIS — F324 Major depressive disorder, single episode, in partial remission: Secondary | ICD-10-CM

## 2021-01-05 DIAGNOSIS — F411 Generalized anxiety disorder: Secondary | ICD-10-CM

## 2021-01-05 DIAGNOSIS — F4010 Social phobia, unspecified: Secondary | ICD-10-CM

## 2021-01-09 ENCOUNTER — Telehealth: Payer: Self-pay

## 2021-01-09 NOTE — Telephone Encounter (Signed)
Patient is calling to follow up on PA form. Please advise

## 2021-01-09 NOTE — Telephone Encounter (Signed)
Pts plan does not cover the Nuvaring, can you send in something different?

## 2021-02-03 ENCOUNTER — Encounter: Payer: Self-pay | Admitting: Family Medicine

## 2021-02-03 NOTE — Telephone Encounter (Signed)
Appt virtual OK

## 2021-02-06 ENCOUNTER — Encounter: Payer: Self-pay | Admitting: Family Medicine

## 2021-02-06 ENCOUNTER — Telehealth (INDEPENDENT_AMBULATORY_CARE_PROVIDER_SITE_OTHER): Payer: Commercial Managed Care - PPO | Admitting: Family Medicine

## 2021-02-06 DIAGNOSIS — Z30015 Encounter for initial prescription of vaginal ring hormonal contraceptive: Secondary | ICD-10-CM | POA: Diagnosis not present

## 2021-02-06 MED ORDER — ETONOGESTREL-ETHINYL ESTRADIOL 0.12-0.015 MG/24HR VA RING
VAGINAL_RING | VAGINAL | 12 refills | Status: DC
Start: 2021-02-06 — End: 2021-08-02

## 2021-02-06 NOTE — Progress Notes (Signed)
There were no vitals taken for this visit.   Subjective:    Patient ID: Gabrielle Jackson, female    DOB: 1990/10/04, 30 y.o.   MRN: [id number]  HPI: Gabrielle Jackson is a 30 y.o. female  Chief Complaint  Patient presents with   Contraception   CONTRACEPTION CONCERNS Contraception: nuvaring Previous contraception: nuvaring, generic Sexual activity: practices safe sex Average interval between menses: 4 weeks Length of menses: 1 week Flow: moderate Dysmenorrhea: no   With the generic nuva-ring got really depressed and bad headaches. Does great with the brand name. No other concerns today.  Relevant past medical, surgical, family and social history reviewed and updated as indicated. Interim medical history since our last visit reviewed. Allergies and medications reviewed and updated.  Review of Systems  Constitutional: Negative.   Respiratory: Negative.    Cardiovascular: Negative.   Gastrointestinal: Negative.   Musculoskeletal: Negative.   Psychiatric/Behavioral: Negative.     Per HPI unless specifically indicated above     Objective:    There were no vitals taken for this visit.  Wt Readings from Last 3 Encounters:  11/10/20 148 lb (67.1 kg)  11/01/20 148 lb (67.1 kg)  07/08/20 155 lb 6.4 oz (70.5 kg)    Physical Exam Vitals and nursing note reviewed.  Constitutional:      General: She is not in acute distress.    Appearance: Normal appearance. She is not ill-appearing, toxic-appearing or diaphoretic.  HENT:     Head: Normocephalic and atraumatic.     Right Ear: External ear normal.     Left Ear: External ear normal.     Nose: Nose normal.     Mouth/Throat:     Mouth: Mucous membranes are moist.     Pharynx: Oropharynx is clear.  Eyes:     General: No scleral icterus.       Right eye: No discharge.        Left eye: No discharge.     Conjunctiva/sclera: Conjunctivae normal.     Pupils: Pupils are equal, round, and reactive to light.  Pulmonary:      Effort: Pulmonary effort is normal. No respiratory distress.     Comments: Speaking in full sentences Musculoskeletal:        General: Normal range of motion.     Cervical back: Normal range of motion.  Skin:    Coloration: Skin is not jaundiced or pale.     Findings: No bruising, erythema, lesion or rash.  Neurological:     Mental Status: She is alert and oriented to person, place, and time. Mental status is at baseline.  Psychiatric:        Mood and Affect: Mood normal.        Behavior: Behavior normal.        Thought Content: Thought content normal.        Judgment: Judgment normal.    Results for orders placed or performed in visit on 11/10/20  Von Willebrand panel  Result Value Ref Range   Factor VIII Activity 129 56 - 140 %   Von Willebrand Ag 102 50 - 200 %   Von Willebrand Factor 72 50 - 200 %  Coag Studies Interp Report  Result Value Ref Range   Interpretation Note   Specimen status report  Result Value Ref Range   specimen status report Comment       Assessment & Plan:   Problem List Items Addressed This Visit   None Visit  Diagnoses     Encounter for initial prescription of vaginal ring hormonal contraceptive    -  Primary   Cannot tolerate generic. Will do PA. Under good control. Call with any concerns.        Follow up plan: Return if symptoms worsen or fail to improve.   This visit was completed via video visit through MyChart due to the restrictions of the COVID-19 pandemic. All issues as above were discussed and addressed. Physical exam was done as above through visual confirmation on video through MyChart. If it was felt that the patient should be evaluated in the office, they were directed there. The patient verbally consented to this visit. Location of the patient: home Location of the provider: work Those involved with this call:  Provider: Park Liter, DO CMA: Yvonna Alanis, [place] Desk/Registration: Roe Rutherford  Time spent on  call:  15 minutes with patient face to face via video conference. More than 50% of this time was spent in counseling and coordination of care. 23 minutes total spent in review of patient's record and preparation of their chart.

## 2021-02-20 ENCOUNTER — Telehealth: Payer: Self-pay | Admitting: Obstetrics and Gynecology

## 2021-02-20 NOTE — Telephone Encounter (Signed)
Spoke with patient regarding her use of Nuvaring. She states that she has tried the generic in the past and this gave her terrible deppressive symptoms. SHe is asking for the brand name, but has been denied through a PA by her insurance. She saw Dr. Wynetta Emery who is trying to get this approved for her. Will await word from patient, if she needs anything else, based on our conversation today.

## 2021-04-20 NOTE — Telephone Encounter (Signed)
PA was submitted on 01/06/21 for Brand name nuvaring. Communication w/patient via patient advice request regarding this. PA was denied.

## 2021-04-30 ENCOUNTER — Other Ambulatory Visit: Payer: Self-pay | Admitting: Family Medicine

## 2021-04-30 DIAGNOSIS — F411 Generalized anxiety disorder: Secondary | ICD-10-CM

## 2021-04-30 DIAGNOSIS — F4010 Social phobia, unspecified: Secondary | ICD-10-CM

## 2021-04-30 DIAGNOSIS — F324 Major depressive disorder, single episode, in partial remission: Secondary | ICD-10-CM

## 2021-04-30 NOTE — Telephone Encounter (Signed)
Called pt and LM on VM to call office to make an appt. Call back number provided.  Requested Prescriptions  Pending Prescriptions Disp Refills   venlafaxine XR (EFFEXOR-XR) 75 MG 24 hr capsule [Pharmacy Med Name: VENLAFAXINE ER 75MG  CAPSULES] 90 capsule 0    Sig: TAKE 1 CAPSULE BY MOUTH EVERY DAY WITH BREAKFAST     Psychiatry: Antidepressants - SNRI - desvenlafaxine & venlafaxine Failed - 04/30/2021  1:17 PM      Failed - LDL in normal range and within 360 days    LDL Chol Calc (NIH)  Date Value Ref Range Status  03/22/2020 40 0 - 99 mg/dL Final          Failed - Total Cholesterol in normal range and within 360 days    Cholesterol, Total  Date Value Ref Range Status  03/22/2020 106 100 - 199 mg/dL Final          Failed - Triglycerides in normal range and within 360 days    Triglycerides  Date Value Ref Range Status  03/22/2020 77 0 - 149 mg/dL Final          Passed - Completed PHQ-2 or PHQ-9 in the last 360 days      Passed - Last BP in normal range    BP Readings from Last 1 Encounters:  11/10/20 125/85          Passed - Valid encounter within last 6 months    Recent Outpatient Visits           2 months ago Encounter for initial prescription of vaginal ring hormonal contraceptive   [place], Megan P, DO   5 months ago Fatigue, unspecified type   [facility], Megan P, DO   9 months ago Numbness and tingling in right hand   [place], Megan P, DO   1 year ago Routine general medical examination at a health care facility   [place], [place] P, DO   1 year ago Cellulitis of finger of right hand   [facility] Eulogio Bear, NP

## 2021-05-06 ENCOUNTER — Other Ambulatory Visit: Payer: Self-pay | Admitting: Family Medicine

## 2021-05-06 DIAGNOSIS — F4010 Social phobia, unspecified: Secondary | ICD-10-CM

## 2021-05-06 DIAGNOSIS — F324 Major depressive disorder, single episode, in partial remission: Secondary | ICD-10-CM

## 2021-05-06 DIAGNOSIS — F411 Generalized anxiety disorder: Secondary | ICD-10-CM

## 2021-05-06 NOTE — Telephone Encounter (Signed)
Called pt and upcoming appt made- last RF 05/02/21 #30 Too soon

## 2021-05-09 ENCOUNTER — Telehealth (INDEPENDENT_AMBULATORY_CARE_PROVIDER_SITE_OTHER): Payer: Commercial Managed Care - PPO | Admitting: Family Medicine

## 2021-05-09 ENCOUNTER — Encounter: Payer: Self-pay | Admitting: Family Medicine

## 2021-05-09 DIAGNOSIS — F324 Major depressive disorder, single episode, in partial remission: Secondary | ICD-10-CM | POA: Diagnosis not present

## 2021-05-09 DIAGNOSIS — F4010 Social phobia, unspecified: Secondary | ICD-10-CM | POA: Diagnosis not present

## 2021-05-09 DIAGNOSIS — F411 Generalized anxiety disorder: Secondary | ICD-10-CM

## 2021-05-09 MED ORDER — VENLAFAXINE HCL ER 75 MG PO CP24: ORAL_CAPSULE | ORAL | 1 refills | Status: DC

## 2021-05-09 NOTE — Assessment & Plan Note (Signed)
Under good control on current regimen. Continue current regimen. Continue to monitor. Call with any concerns. Refills given.   

## 2021-05-09 NOTE — Progress Notes (Signed)
There were no vitals taken for this visit.   Subjective:    Patient ID: Gabrielle Jackson, female    DOB: 12/31/1990, 30 y.o.   MRN: [id number]  HPI: Gabrielle Jackson is a 30 y.o. female  Chief Complaint  Patient presents with   Depression   DEPRESSION Mood status: controlled Satisfied with current treatment?: yes Symptom severity: mild  Duration of current treatment : chronic Side effects: no Medication compliance: excellent compliance Psychotherapy/counseling: no  Previous psychiatric medications: effexor Depressed mood: no Anxious mood: no Anhedonia: no Significant weight loss or gain: no Insomnia: no  Fatigue: no Feelings of worthlessness or guilt: no Impaired concentration/indecisiveness: no Suicidal ideations: no Hopelessness: no Crying spells: no Depression screen [facility] 2/9 05/09/2021 11/10/2020 07/08/2020 03/22/2020 12/16/2018  Decreased Interest 0 0 0 0 1  Down, Depressed, Hopeless 0 0 1 0 3  PHQ - 2 Score 0 0 1 0 4  Altered sleeping 0 0 0 0 3  Tired, decreased energy 0 0 0 0 3  Change in appetite 0 0 0 0 3  Feeling bad or failure about yourself  0 0 0 0 3  Trouble concentrating 0 0 0 0 3  Moving slowly or fidgety/restless 0 0 0 0 0  Suicidal thoughts 0 0 0 0 0  PHQ-9 Score 0 0 1 0 19  Difficult doing work/chores Not difficult at all - Not difficult at all Not difficult at all Very difficult  Some encounter information is confidential and restricted. Go to Review Flowsheets activity to see all data.  Some recent data might be hidden    Relevant past medical, surgical, family and social history reviewed and updated as indicated. Interim medical history since our last visit reviewed. Allergies and medications reviewed and updated.  Review of Systems  Constitutional: Negative.   Respiratory: Negative.    Cardiovascular: Negative.   Gastrointestinal: Negative.   Musculoskeletal: Negative.   Psychiatric/Behavioral: Negative.     Per HPI unless  specifically indicated above     Objective:    There were no vitals taken for this visit.  Wt Readings from Last 3 Encounters:  11/10/20 148 lb (67.1 kg)  11/01/20 148 lb (67.1 kg)  07/08/20 155 lb 6.4 oz (70.5 kg)    Physical Exam Vitals and nursing note reviewed.  Constitutional:      General: She is not in acute distress.    Appearance: Normal appearance. She is not ill-appearing, toxic-appearing or diaphoretic.  HENT:     Head: Normocephalic and atraumatic.     Right Ear: External ear normal.     Left Ear: External ear normal.     Nose: Nose normal.     Mouth/Throat:     Mouth: Mucous membranes are moist.     Pharynx: Oropharynx is clear.  Eyes:     General: No scleral icterus.       Right eye: No discharge.        Left eye: No discharge.     Extraocular Movements: Extraocular movements intact.     Conjunctiva/sclera: Conjunctivae normal.     Pupils: Pupils are equal, round, and reactive to light.  Cardiovascular:     Rate and Rhythm: Normal rate and regular rhythm.     Pulses: Normal pulses.     Heart sounds: Normal heart sounds. No murmur heard.   No friction rub. No gallop.  Pulmonary:     Effort: Pulmonary effort is normal. No respiratory distress.     Breath  sounds: Normal breath sounds. No stridor. No wheezing, rhonchi or rales.  Chest:     Chest wall: No tenderness.  Musculoskeletal:        General: Normal range of motion.     Cervical back: Normal range of motion and neck supple.  Skin:    General: Skin is warm and dry.     Capillary Refill: Capillary refill takes less than 2 seconds.     Coloration: Skin is not jaundiced or pale.     Findings: No bruising, erythema, lesion or rash.  Neurological:     General: No focal deficit present.     Mental Status: She is alert and oriented to person, place, and time. Mental status is at baseline.  Psychiatric:        Mood and Affect: Mood normal.        Behavior: Behavior normal.        Thought Content:  Thought content normal.        Judgment: Judgment normal.    Results for orders placed or performed in visit on 11/10/20  Von Willebrand panel  Result Value Ref Range   Factor VIII Activity 129 56 - 140 %   Von Willebrand Ag 102 50 - 200 %   Von Willebrand Factor 72 50 - 200 %  Coag Studies Interp Report  Result Value Ref Range   Interpretation Note   Specimen status report  Result Value Ref Range   specimen status report Comment       Assessment & Plan:   Problem List Items Addressed This Visit       Other   GAD (generalized anxiety disorder)   Relevant Medications   venlafaxine XR (EFFEXOR-XR) 75 MG 24 hr capsule   Social anxiety disorder   Relevant Medications   venlafaxine XR (EFFEXOR-XR) 75 MG 24 hr capsule   MDD (major depressive disorder), single episode, in partial remission (HCC)    Under good control on current regimen. Continue current regimen. Continue to monitor. Call with any concerns. Refills given.        Relevant Medications   venlafaxine XR (EFFEXOR-XR) 75 MG 24 hr capsule     Follow up plan: Return in about 6 months (around 11/06/2021), or physical.    This visit was completed via video visit through [place] due to the restrictions of the COVID-19 pandemic. All issues as above were discussed and addressed. Physical exam was done as above through visual confirmation on video through MyChart. If it was felt that the patient should be evaluated in the office, they were directed there. The patient verbally consented to this visit. Location of the patient: home Location of the provider: work Those involved with this call:  Provider: Park Liter, DO CMA: Louanna Raw, [place] Desk/Registration: [employer]  Time spent on call:  15 minutes with patient face to face via video conference. More than 50% of this time was spent in counseling and coordination of care. 23 minutes total spent in review of patient's record and preparation of their  chart.

## 2021-05-11 ENCOUNTER — Telehealth: Payer: Self-pay

## 2021-05-11 NOTE — Telephone Encounter (Signed)
Prior Authorization initiated for Nuvaring 0.12-0.015 MG  KEY: [id number]

## 2021-05-11 NOTE — Telephone Encounter (Signed)
-----   Message from Valerie Roys, [place] sent at 05/09/2021  4:30 PM EST ----- Can we check on the PA for her nuvaring?

## 2021-05-12 ENCOUNTER — Telehealth: Payer: Self-pay

## 2021-05-12 NOTE — Telephone Encounter (Signed)
PA for Nuvaring 0.12-0.015 MG/24HR denied via CoverMyMeds

## 2021-05-12 NOTE — Telephone Encounter (Signed)
Please let her know that they denied the nuvaring for [place] too. I've got on good Rx that she can get it for $167 and she may be able to get a coupon from their website, but I'm not sure what else we can do.

## 2021-05-15 NOTE — Telephone Encounter (Signed)
Spoke with patient and notified her of Dr.Johnson's recommendations. Patient verbalized understanding and has no further question at this time.

## 2021-05-22 ENCOUNTER — Telehealth: Payer: Commercial Managed Care - PPO | Admitting: Family Medicine

## 2021-05-22 DIAGNOSIS — J0140 Acute pansinusitis, unspecified: Secondary | ICD-10-CM | POA: Diagnosis not present

## 2021-05-22 MED ORDER — AMOXICILLIN-POT CLAVULANATE 875-125 MG PO TABS: 1.0000 | ORAL_TABLET | Freq: Two times a day (BID) | ORAL | 0 refills | Status: AC

## 2021-05-22 NOTE — Patient Instructions (Addendum)
I appreciate the opportunity to provide you with care for your health and wellness.  Take medication as directed  Please continue to practice social distancing to keep you, your family, and our community safe.  If you must go out, please wear a mask and practice good handwashing.  Have a wonderful day. With Gratitude, Cherly Beach, DNP, AGNP-BC   Today you were seen for URI/Sinus Infection.  Please take the prescribed medication as directed and complete the full dose to prevent reinfection.  Please eat yogurt or take a probiotic if possible to avoid a yeast infection (this is common) from antibiotic.  As suggested for symptom management use Nasal saline spray to help ease congestion and dry nasal passages. Can be used throughout the day as needed. Avoid forceful blowing of nose. It will be common to have some blood if blowing nose a lot or if nose is very dry. Can you a humidifier at home as well. Remember to wash and air it out regularly. Tylenol (325 mg 2 tablets every 6 hours) and or Ibuprofen (200- 400 mg every 8 hours) for fever, sore throat and body aches. Can consider use of a Neti-pot to wash out sinus cavity (twice daily). Please be aware that you need to use distilled or boiled water for these. If congestion is increasing and or coughing can use Mucinex (twice daily) for cough and congestion with full glass of water.  If you have a sore throat warm fluids like hot tea or lemon water can help sooth this.    Please hydrate, rest, get fresh air daily and wash your hands well.  I hope you feel better soon.

## 2021-05-22 NOTE — Progress Notes (Signed)
Virtual Visit Consent   Gabrielle Jackson, you are scheduled for a virtual visit with a [facility] provider today.     Just as with appointments in the office, your consent must be obtained to participate.  Your consent will be active for this visit and any virtual visit you may have with one of our providers in the next 365 days.     If you have a MyChart account, a copy of this consent can be sent to you electronically.  All virtual visits are billed to your insurance company just like a traditional visit in the office.    As this is a virtual visit, video technology does not allow for your provider to perform a traditional examination.  This may limit your provider's ability to fully assess your condition.  If your provider identifies any concerns that need to be evaluated in person or the need to arrange testing (such as labs, EKG, etc.), we will make arrangements to do so.     Although advances in technology are sophisticated, we cannot ensure that it will always work on either your end or our end.  If the connection with a video visit is poor, the visit may have to be switched to a telephone visit.  With either a video or telephone visit, we are not always able to ensure that we have a secure connection.     I need to obtain your verbal consent now.   Are you willing to proceed with your visit today?    Gabrielle Jackson has provided verbal consent on 05/22/2021 for a virtual visit (video or telephone).   [name], NP   Date: 05/22/2021 8:45 AM   Virtual Visit via Video Note   I, [name], connected with  Gabrielle Jackson  ([id number], 05-Jul-1990) on 05/22/21 at  8:45 AM EST by a video-enabled telemedicine application and verified that I am speaking with the correct person using two identifiers.  Location: Patient: Virtual Visit Location Patient: Home Provider: Virtual Visit Location Provider: Home Office   I discussed the limitations of evaluation and management  by telemedicine and the availability of in person appointments. The patient expressed understanding and agreed to proceed.    History of Present Illness: Gabrielle Jackson is a 30 y.o. who identifies as a female who was assigned female at birth, and is being seen today for sinus infection.  HPI: URI  This is a new problem. The current episode started 1 to 4 weeks ago. The problem has been gradually worsening. There has been no fever. Associated symptoms include sinus pain. Pertinent negatives include no chest pain, rhinorrhea or wheezing. She has tried increased fluids and sleep (OTC cold medications) for the symptoms. The treatment provided mild relief.    Reports daughter had something similar last week as well   Problems:  Patient Active Problem List   Diagnosis Date Noted   Cellulitis of finger of right hand 02/16/2020   MDD (major depressive disorder), single episode, in partial remission ([place]) 05/12/2019   Social anxiety disorder 04/01/2019   Bereavement 03/13/2019   Depressive disorder 02/13/2019   GAD (generalized anxiety disorder) 09/05/2015   Partial hamstring tear 05/02/2015    Allergies:  Allergies  Allergen Reactions   Penicillins Rash   Medications:  Current Outpatient Medications:    etonogestrel-ethinyl estradiol (NUVARING) 0.12-0.015 MG/24HR vaginal ring, Insert vaginally and leave in place for 3 consecutive weeks, then remove for 1 week. (Patient not taking: Reported on  05/09/2021), Disp: 1 each, Rfl: 12   venlafaxine XR (EFFEXOR-XR) 75 MG 24 hr capsule, TAKE 1 CAPSULE BY MOUTH EVERY DAY WITH BREAKFAST, Disp: 90 capsule, Rfl: 1  Observations/Objective: Patient is well-developed, well-nourished in no acute distress.  Resting comfortably  at home.  Head is normocephalic, atraumatic.  No labored breathing.  Speech is clear and coherent with logical content.  Patient is alert and oriented at baseline.  Cough present Nasal tone and hoarseness   Assessment and  Plan: 1. Acute non-recurrent pansinusitis  S&S consistent with 7-10 days post virus cold- sinus infection setting in. Will treat with Aug- discussed PCN allergy- reports no issue taking Aug in past.   OTC measures discussed and on AVS  - amoxicillin-clavulanate (AUGMENTIN) 875-125 MG tablet; Take 1 tablet by mouth 2 (two) times daily for 7 days.  Dispense: 14 tablet; Refill: 0   Reviewed side effects, risks and benefits of medication.    Patient acknowledged agreement and understanding of the plan.   I discussed the assessment and treatment plan with the patient. The patient was provided an opportunity to ask questions and all were answered. The patient agreed with the plan and demonstrated an understanding of the instructions.   The patient was advised to call back or seek an in-person evaluation if the symptoms worsen or if the condition fails to improve as anticipated.   The above assessment and management plan was discussed with the patient. The patient verbalized understanding of and has agreed to the management plan. Patient is aware to call the clinic if symptoms persist or worsen. Patient is aware when to return to the clinic for a follow-up visit. Patient educated on when it is appropriate to go to the emergency department.    Follow Up Instructions: I discussed the assessment and treatment plan with the patient. The patient was provided an opportunity to ask questions and all were answered. The patient agreed with the plan and demonstrated an understanding of the instructions.  A copy of instructions were sent to the patient via MyChart unless otherwise noted below.     The patient was advised to call back or seek an in-person evaluation if the symptoms worsen or if the condition fails to improve as anticipated.  Time:  I spent 10 minutes with the patient via telehealth technology discussing the above problems/concerns.    [name], NP

## 2021-05-29 ENCOUNTER — Encounter: Payer: Self-pay | Admitting: Family Medicine

## 2021-05-29 ENCOUNTER — Telehealth: Payer: Commercial Managed Care - PPO

## 2021-05-29 NOTE — Telephone Encounter (Signed)
appt

## 2021-05-30 ENCOUNTER — Telehealth (INDEPENDENT_AMBULATORY_CARE_PROVIDER_SITE_OTHER): Payer: Commercial Managed Care - PPO | Admitting: Nurse Practitioner

## 2021-05-30 ENCOUNTER — Encounter: Payer: Self-pay | Admitting: Nurse Practitioner

## 2021-05-30 DIAGNOSIS — T50905A Adverse effect of unspecified drugs, medicaments and biological substances, initial encounter: Secondary | ICD-10-CM

## 2021-05-30 DIAGNOSIS — N898 Other specified noninflammatory disorders of vagina: Secondary | ICD-10-CM | POA: Diagnosis not present

## 2021-05-30 DIAGNOSIS — J019 Acute sinusitis, unspecified: Secondary | ICD-10-CM

## 2021-05-30 MED ORDER — FLUCONAZOLE 150 MG PO TABS: 150.0000 mg | ORAL_TABLET | Freq: Once | ORAL | 0 refills | Status: AC

## 2021-05-30 MED ORDER — DOXYCYCLINE HYCLATE 100 MG PO TABS: 100.0000 mg | ORAL_TABLET | Freq: Two times a day (BID) | ORAL | 0 refills | Status: DC

## 2021-05-30 MED ORDER — PREDNISONE 10 MG PO TABS: ORAL_TABLET | ORAL | 0 refills | Status: DC

## 2021-05-30 NOTE — Telephone Encounter (Signed)
Mirena rcvd/charged 12/09/2019

## 2021-05-30 NOTE — Progress Notes (Signed)
Acute Office Visit  Subjective:    Patient ID: Gabrielle Jackson, female    DOB: 02-01-1991, 30 y.o.   MRN: [id number]  Chief Complaint  Patient presents with   Rash    FU    HPI Patient is in today for last a rash that started last Tuesday after being prescribed augmentin for a sinus and ear infection. Stopped the antibiotic on Saturday. She has been taking benadryl. Endorses the rash is all over her back, arms, legs, stomach, and face, and is very itchy. Denies shortness of breath, tongue and/or throat swelling.   She states that her sinuses have cleared up mostly, but she is still having right ear pain. She also has started having vaginal irritation and itching.    Past Medical History:  Diagnosis Date   Anxiety disorder    Breast mass 05/02/2010   lt breast pain   Car sickness    Depression    Dysmenorrhea    Ovarian cyst    Velamentous insertion of umbilical cord     Past Surgical History:  Procedure Laterality Date   TYMPANOPLASTY Right 03/21/2018   Procedure: TYMPANOPLASTY WITH GRAFT;  Surgeon: Beverly Gust, MD;  Location: [facility];  Service: ENT;  Laterality: Right;    Family History  Problem Relation Age of Onset   Depression Mother    Leukemia Mother    Hypertension Father    Depression Maternal Grandmother    COPD Maternal Grandfather    COPD Paternal Grandmother    Lung cancer Paternal Grandmother     Social History   Socioeconomic History   Marital status: Legally Separated    Spouse name: Not on file   Number of children: 2   Years of education: Not on file   Highest education level: Not on file  Occupational History   Not on file  Tobacco Use   Smoking status: Never   Smokeless tobacco: Never  Vaping Use   Vaping Use: Never used  Substance and Sexual Activity   Alcohol use: Yes    Alcohol/week: 0.0 standard drinks    Comment: On occasion   Drug use: No   Sexual activity: Yes    Birth control/protection: None, Inserts   Other Topics Concern   Not on file  Social History Narrative   Not on file   Social Determinants of Health   Financial Resource Strain: Not on file  Food Insecurity: Not on file  Transportation Needs: Not on file  Physical Activity: Not on file  Stress: Not on file  Social Connections: Not on file  Intimate Partner Violence: Not on file    Outpatient Medications Prior to Visit  Medication Sig Dispense Refill   venlafaxine XR (EFFEXOR-XR) 75 MG 24 hr capsule TAKE 1 CAPSULE BY MOUTH EVERY DAY WITH BREAKFAST 90 capsule 1   etonogestrel-ethinyl estradiol (NUVARING) 0.12-0.015 MG/24HR vaginal ring Insert vaginally and leave in place for 3 consecutive weeks, then remove for 1 week. 1 each 12   No facility-administered medications prior to visit.    Allergies  Allergen Reactions   Penicillins Rash    Review of Systems  Constitutional:  Positive for fatigue. Negative for fever.  HENT:  Positive for congestion and ear pain. Negative for sinus pressure and sinus pain.   Respiratory: Negative.    Cardiovascular: Negative.   Skin:  Positive for rash.  Neurological: Negative.       Objective:    Physical Exam Vitals and nursing note reviewed.  Constitutional:      General: She is not in acute distress.    Appearance: Normal appearance.  HENT:     Head: Normocephalic.  Eyes:     Conjunctiva/sclera: Conjunctivae normal.  Pulmonary:     Effort: Pulmonary effort is normal.     Comments: Able to talk in complete sentences Neurological:     Mental Status: She is alert and oriented to person, place, and time.  Psychiatric:        Mood and Affect: Mood normal.        Behavior: Behavior normal.        Thought Content: Thought content normal.        Judgment: Judgment normal.    There were no vitals taken for this visit. Wt Readings from Last 3 Encounters:  11/10/20 148 lb (67.1 kg)  11/01/20 148 lb (67.1 kg)  07/08/20 155 lb 6.4 oz (70.5 kg)    Health Maintenance Due   Topic Date Due   COVID-19 Vaccine (1) Never done   Pneumococcal Vaccine 42-28 Years old (1 - PCV) Never done    There are no preventive care reminders to display for this patient.   Lab Results  Component Value Date   TSH 1.190 11/10/2020   Lab Results  Component Value Date   WBC 6.7 11/01/2020   HGB 13.9 11/01/2020   HCT 41.2 11/01/2020   MCV 95 11/01/2020   PLT 369 04/25/2020   Lab Results  Component Value Date   NA 140 11/10/2020   K 4.2 11/10/2020   CO2 20 11/10/2020   GLUCOSE 81 11/10/2020   BUN 16 11/10/2020   CREATININE 0.99 11/10/2020   BILITOT 0.2 11/10/2020   ALKPHOS 95 11/10/2020   AST 15 11/10/2020   ALT 16 11/10/2020   PROT 7.5 11/10/2020   ALBUMIN 4.4 11/10/2020   CALCIUM 9.4 11/10/2020   ANIONGAP 9 04/25/2020   EGFR 79 11/10/2020   Lab Results  Component Value Date   CHOL 106 03/22/2020   Lab Results  Component Value Date   HDL 50 03/22/2020   Lab Results  Component Value Date   LDLCALC 40 03/22/2020   Lab Results  Component Value Date   TRIG 77 03/22/2020   No results found for: CHOLHDL No results found for: HGBA1C     Assessment & Plan:   Problem List Items Addressed This Visit   None Visit Diagnoses     Adverse effect of drug, initial encounter    -  Primary   She has already stopped antibiotic, will give prednisone taper to help with rash and itching. Can continue benadryl as needed   Acute non-recurrent sinusitis, unspecified location       With ongoing ear pain, will treat sinus infection and ear infection with doxycyline. Can take ibuprofen or tylenol as needed for pain   Relevant Medications   doxycycline (VIBRA-TABS) 100 MG tablet   predniSONE (DELTASONE) 10 MG tablet   fluconazole (DIFLUCAN) 150 MG tablet   Vaginal itching       Most likely yeast infection with recent antibiotics. Will give 1 dose diflucan today and then 1 after she completes the antibiotics in 1 week. F/U if not better        Meds ordered this  encounter  Medications   doxycycline (VIBRA-TABS) 100 MG tablet    Sig: Take 1 tablet (100 mg total) by mouth 2 (two) times daily.    Dispense:  14 tablet    Refill:  0   predniSONE (DELTASONE) 10 MG tablet    Sig: Take 6 tablets today, then 5 tablets tomorrow, then decrease by 1 tablet every day until gone    Dispense:  21 tablet    Refill:  0   fluconazole (DIFLUCAN) 150 MG tablet    Sig: Take 1 tablet (150 mg total) by mouth once for 1 dose. Take 1 tablet today and 1 tablet in 7 days when completed antibiotics    Dispense:  2 tablet    Refill:  0    This visit was completed via MyChart due to the restrictions of the COVID-19 pandemic. All issues as above were discussed and addressed. Physical exam was done as above through visual confirmation on MyChart. If it was felt that the patient should be evaluated in the office, they were directed there. The patient verbally consented to this visit. Location of the patient: home Location of the provider: work Those involved with this call:  Provider: Vance Peper, NP CMA:  Donato Schultz, Student CMA Front Desk/Registration: [employer]  Time spent on call:  10 minutes with patient face to face via video conference. More than 50% of this time was spent in counseling and coordination of care. 10 minutes total spent in review of patient's record and preparation of their chart.   Charyl Dancer, NP

## 2021-05-30 NOTE — Telephone Encounter (Signed)
Appt scheduled

## 2021-06-16 ENCOUNTER — Encounter: Payer: Self-pay | Admitting: Family Medicine

## 2021-06-16 NOTE — Telephone Encounter (Signed)
appt

## 2021-06-19 NOTE — Telephone Encounter (Signed)
Pt scheduled  

## 2021-06-22 ENCOUNTER — Ambulatory Visit: Payer: Commercial Managed Care - PPO | Admitting: Family Medicine

## 2021-07-04 ENCOUNTER — Ambulatory Visit: Payer: Commercial Managed Care - PPO | Admitting: Family Medicine

## 2021-07-27 ENCOUNTER — Telehealth: Payer: Commercial Managed Care - PPO | Admitting: Family Medicine

## 2021-07-27 DIAGNOSIS — H6691 Otitis media, unspecified, right ear: Secondary | ICD-10-CM | POA: Diagnosis not present

## 2021-07-27 MED ORDER — DOXYCYCLINE HYCLATE 100 MG PO TABS: 100.0000 mg | ORAL_TABLET | Freq: Two times a day (BID) | ORAL | 0 refills | Status: DC

## 2021-07-27 NOTE — Progress Notes (Deleted)
Virtual Visit Consent   Gabrielle Jackson, you are scheduled for a virtual visit with a [facility] provider today.     Just as with appointments in the office, your consent must be obtained to participate.  Your consent will be active for this visit and any virtual visit you may have with one of our providers in the next 365 days.     If you have a MyChart account, a copy of this consent can be sent to you electronically.  All virtual visits are billed to your insurance company just like a traditional visit in the office.    As this is a virtual visit, video technology does not allow for your provider to perform a traditional examination.  This may limit your provider's ability to fully assess your condition.  If your provider identifies any concerns that need to be evaluated in person or the need to arrange testing (such as labs, EKG, etc.), we will make arrangements to do so.     Although advances in technology are sophisticated, we cannot ensure that it will always work on either your end or our end.  If the connection with a video visit is poor, the visit may have to be switched to a telephone visit.  With either a video or telephone visit, we are not always able to ensure that we have a secure connection.     I need to obtain your verbal consent now.   Are you willing to proceed with your visit today?    Gabrielle Jackson has provided verbal consent on 07/27/2021 for a virtual visit (video or telephone).   Gabrielle Mayo, NP   Date: 07/27/2021 11:23 AM   Virtual Visit via Video Note   I, Gabrielle Jackson, connected with  Gabrielle Jackson  ([id number], December 05, 1990) on 07/27/21 at 12:15 PM EST by a video-enabled telemedicine application and verified that I am speaking with the correct person using two identifiers.  Location: Patient: Virtual Visit Location Patient: Home Provider: Virtual Visit Location Provider: Home Office   I discussed the limitations of evaluation and management  by telemedicine and the availability of in person appointments. The patient expressed understanding and agreed to proceed.    History of Present Illness: Gabrielle Jackson is a 31 y.o. who identifies as a female who was assigned female at birth, and is being seen today for sinus infection symptoms    HPI: Sinusitis   Problems:  Patient Active Problem List   Diagnosis Date Noted   Cellulitis of finger of right hand 02/16/2020   MDD (major depressive disorder), single episode, in partial remission ([place]) 05/12/2019   Social anxiety disorder 04/01/2019   Bereavement 03/13/2019   Depressive disorder 02/13/2019   GAD (generalized anxiety disorder) 09/05/2015   Partial hamstring tear 05/02/2015    Allergies:  Allergies  Allergen Reactions   Penicillins Rash   Medications:  Current Outpatient Medications:    doxycycline (VIBRA-TABS) 100 MG tablet, Take 1 tablet (100 mg total) by mouth 2 (two) times daily., Disp: 14 tablet, Rfl: 0   etonogestrel-ethinyl estradiol (NUVARING) 0.12-0.015 MG/24HR vaginal ring, Insert vaginally and leave in place for 3 consecutive weeks, then remove for 1 week., Disp: 1 each, Rfl: 12   predniSONE (DELTASONE) 10 MG tablet, Take 6 tablets today, then 5 tablets tomorrow, then decrease by 1 tablet every day until gone, Disp: 21 tablet, Rfl: 0   venlafaxine XR (EFFEXOR-XR) 75 MG 24 hr capsule, TAKE 1 CAPSULE BY MOUTH  EVERY DAY WITH BREAKFAST, Disp: 90 capsule, Rfl: 1  Observations/Objective: Patient is well-developed, well-nourished in no acute distress.  Resting comfortably  at home.  Head is normocephalic, atraumatic.  No labored breathing.  Speech is clear and coherent with logical content.  Patient is alert and oriented at baseline.      Follow Up Instructions: I discussed the assessment and treatment plan with the patient. The patient was provided an opportunity to ask questions and all were answered. The patient agreed with the plan and demonstrated  an understanding of the instructions.  A copy of instructions were sent to the patient via MyChart unless otherwise noted below.     The patient was advised to call back or seek an in-person evaluation if the symptoms worsen or if the condition fails to improve as anticipated.  Time:  I spent 10 minutes with the patient via telehealth technology discussing the above problems/concerns.    Gabrielle Mayo, NP

## 2021-07-27 NOTE — Patient Instructions (Signed)

## 2021-07-27 NOTE — Progress Notes (Signed)
Virtual Visit Consent   Gabrielle Jackson, you are scheduled for a virtual visit with a [facility] provider today.     Just as with appointments in the office, your consent must be obtained to participate.  Your consent will be active for this visit and any virtual visit you may have with one of our providers in the next 365 days.     If you have a MyChart account, a copy of this consent can be sent to you electronically.  All virtual visits are billed to your insurance company just like a traditional visit in the office.    As this is a virtual visit, video technology does not allow for your provider to perform a traditional examination.  This may limit your provider's ability to fully assess your condition.  If your provider identifies any concerns that need to be evaluated in person or the need to arrange testing (such as labs, EKG, etc.), we will make arrangements to do so.     Although advances in technology are sophisticated, we cannot ensure that it will always work on either your end or our end.  If the connection with a video visit is poor, the visit may have to be switched to a telephone visit.  With either a video or telephone visit, we are not always able to ensure that we have a secure connection.     I need to obtain your verbal consent now.   Are you willing to proceed with your visit today?    [name] has provided verbal consent on 07/27/2021 for a virtual visit (video or telephone).   [name], NP   Date: 07/27/2021 1:40 PM   Virtual Visit via Video Note   I, [name], connected with  TEXAS OBORN  ([id number], [birth year]) on 07/27/21 at  1:30 PM EST by a video-enabled telemedicine application and verified that I am speaking with the correct person using two identifiers.  Location: Patient: Virtual Visit Location Patient: Home Provider: Virtual Visit Location Provider: Home Office   I discussed the limitations of evaluation and management  by telemedicine and the availability of in person appointments. The patient expressed understanding and agreed to proceed.    History of Present Illness: Gabrielle Jackson is a 31 y.o. who identifies as a female who was assigned female at birth, and is being seen today for ear pain.    Otalgia  There is pain in the right ear. This is a new problem. The current episode started in the past 7 days. The problem occurs constantly. The problem has been gradually worsening. There has been no fever. The pain is at a severity of 6/10. The pain is moderate. Associated symptoms include coughing, headaches, rhinorrhea and a sore throat. Pertinent negatives include no ear discharge, neck pain, rash or vomiting. Associated symptoms comments: Recent sinus symptoms with cold over the last week. She has tried NSAIDs for the symptoms. The treatment provided no relief.    Problems:  Patient Active Problem List   Diagnosis Date Noted   Cellulitis of finger of right hand 02/16/2020   MDD (major depressive disorder), single episode, in partial remission ([place]) 05/12/2019   Social anxiety disorder 04/01/2019   Bereavement 03/13/2019   Depressive disorder 02/13/2019   GAD (generalized anxiety disorder) 09/05/2015   Partial hamstring tear 05/02/2015    Allergies:  Allergies  Allergen Reactions   Penicillins Rash   Medications:  Current Outpatient Medications:    doxycycline (  VIBRA-TABS) 100 MG tablet, Take 1 tablet (100 mg total) by mouth 2 (two) times daily., Disp: 14 tablet, Rfl: 0   etonogestrel-ethinyl estradiol (NUVARING) 0.12-0.015 MG/24HR vaginal ring, Insert vaginally and leave in place for 3 consecutive weeks, then remove for 1 week., Disp: 1 each, Rfl: 12   predniSONE (DELTASONE) 10 MG tablet, Take 6 tablets today, then 5 tablets tomorrow, then decrease by 1 tablet every day until gone, Disp: 21 tablet, Rfl: 0   venlafaxine XR (EFFEXOR-XR) 75 MG 24 hr capsule, TAKE 1 CAPSULE BY MOUTH EVERY DAY WITH  BREAKFAST, Disp: 90 capsule, Rfl: 1  Observations/Objective: Patient is well-developed, well-nourished in no acute distress.  Resting comfortably  at home.  Head is normocephalic, atraumatic.  No labored breathing.  Speech is clear and coherent with logical content.  Patient is alert and oriented at baseline.  Nasal tone and cough present  Assessment and Plan:  1. Acute infection of right ear Secondary ear infection after cold symptoms a week back. Will treat w Dox due to PCN allergy  OTC measures reviewed    Reviewed side effects, risks and benefits of medication.    Patient acknowledged agreement and understanding of the plan.     - doxycycline (VIBRA-TABS) 100 MG tablet; Take 1 tablet (100 mg total) by mouth 2 (two) times daily for 7 days.  Dispense: 14 tablet; Refill: 0   Follow Up Instructions: I discussed the assessment and treatment plan with the patient. The patient was provided an opportunity to ask questions and all were answered. The patient agreed with the plan and demonstrated an understanding of the instructions.  A copy of instructions were sent to the patient via MyChart unless otherwise noted below.     The patient was advised to call back or seek an in-person evaluation if the symptoms worsen or if the condition fails to improve as anticipated.  Time:  I spent 10 minutes with the patient via telehealth technology discussing the above problems/concerns.    [name], NP

## 2021-07-27 NOTE — Progress Notes (Signed)
Cancelled at last min.

## 2021-08-01 ENCOUNTER — Ambulatory Visit: Payer: Commercial Managed Care - PPO | Admitting: Family Medicine

## 2021-08-02 ENCOUNTER — Other Ambulatory Visit: Payer: Self-pay

## 2021-08-02 ENCOUNTER — Ambulatory Visit: Payer: Self-pay

## 2021-08-02 ENCOUNTER — Encounter: Payer: Self-pay | Admitting: Nurse Practitioner

## 2021-08-02 ENCOUNTER — Encounter: Payer: Self-pay | Admitting: Family Medicine

## 2021-08-02 ENCOUNTER — Ambulatory Visit (INDEPENDENT_AMBULATORY_CARE_PROVIDER_SITE_OTHER): Payer: Commercial Managed Care - PPO | Admitting: Nurse Practitioner

## 2021-08-02 VITALS — BP 116/80 | HR 111 | Temp 98.0°F | Wt 178.2 lb

## 2021-08-02 DIAGNOSIS — R1011 Right upper quadrant pain: Secondary | ICD-10-CM | POA: Diagnosis not present

## 2021-08-02 NOTE — Telephone Encounter (Signed)
°  Chief Complaint: abdominal pain Symptoms: RUQ pain 7/10, tender to touch, nausea Frequency: yesterday but today gotten worse Pertinent Negatives: NA Disposition: [] ED /[] Urgent Care (no appt availability in office) / [x] Appointment(In office/virtual)/ []  [facility]/ [] Home Care/ [] Refused Recommended Disposition /[] [facility]/ []  Follow-up with PCP Additional Notes: Pt has taken Aleve for pain but hasn't helped.    Reason for Disposition  [1] MILD-MODERATE pain AND [2] constant AND [3] present > 2 hours  Answer Assessment - Initial Assessment Questions 1. LOCATION: "Where does it hurt?"      RUQ 2. RADIATION: "Does the pain shoot anywhere else?" (e.g., chest, back)     back 3. ONSET: "When did the pain begin?" (e.g., minutes, hours or days ago)      yesterday 4. SUDDEN: "Gradual or sudden onset?"     Gradual  5. PATTERN "Does the pain come and go, or is it constant?"    - If constant: "Is it getting better, staying the same, or worsening?"      (Note: Constant means the pain never goes away completely; most serious pain is constant and it progresses)     - If intermittent: "How long does it last?" "Do you have pain now?"     (Note: Intermittent means the pain goes away completely between bouts)     constant 6. SEVERITY: "How bad is the pain?"  (e.g., Scale 1-10; mild, moderate, or severe)   - MILD (1-3): doesn't interfere with normal activities, abdomen soft and not tender to touch    - MODERATE (4-7): interferes with normal activities or awakens from sleep, abdomen tender to touch    - SEVERE (8-10): excruciating pain, doubled over, unable to do any normal activities      7 10. OTHER SYMPTOMS: "Do you have any other symptoms?" (e.g., back pain, diarrhea, fever, urination pain, vomiting)       Nausea,  11. PREGNANCY: "Is there any chance you are pregnant?" "When was your last menstrual period?"       NO  Protocols used: Abdominal Pain - [facility]

## 2021-08-02 NOTE — Progress Notes (Signed)
BP 116/80    Pulse (!) 111    Temp 98 F (36.7 C) (Oral)    Wt 178 lb 3.2 oz (80.8 kg)    LMP 07/04/2021 (Approximate)    SpO2 100%    BMI 27.91 kg/m    Subjective:    Patient ID: Gabrielle Jackson, female    DOB: 1990/10/27, 31 y.o.   MRN: [id number]  HPI: Gabrielle Jackson is a 31 y.o. female  Chief Complaint  Patient presents with   Pain    Pt states she has been having pain in the upper R abdomen area since yesterday   ABDOMINAL PAIN  Duration:days- yesterday Onset: sudden Severity: 7/10 Quality: sharp Location:  RUQ  Episode duration: constant Radiation: yes to back Frequency: constant Alleviating factors: nothing Aggravating factors: Status: worse Treatments attempted: none Fever: no Nausea: yes Vomiting: no Weight loss: no Decreased appetite: no Diarrhea: no Constipation: no Blood in stool: no Heartburn: yes Jaundice: no Rash: no Dysuria/urinary frequency: no Hematuria: no History of sexually transmitted disease: no Recurrent NSAID use: no  Relevant past medical, surgical, family and social history reviewed and updated as indicated. Interim medical history since our last visit reviewed. Allergies and medications reviewed and updated.  Review of Systems  Gastrointestinal:  Positive for abdominal pain and nausea. Negative for blood in stool, constipation, diarrhea and vomiting.   Per HPI unless specifically indicated above     Objective:    BP 116/80    Pulse (!) 111    Temp 98 F (36.7 C) (Oral)    Wt 178 lb 3.2 oz (80.8 kg)    LMP 07/04/2021 (Approximate)    SpO2 100%    BMI 27.91 kg/m   Wt Readings from Last 3 Encounters:  08/02/21 178 lb 3.2 oz (80.8 kg)  11/10/20 148 lb (67.1 kg)  11/01/20 148 lb (67.1 kg)    Physical Exam Vitals and nursing note reviewed.  Constitutional:      General: She is not in acute distress.    Appearance: Normal appearance. She is normal weight. She is not ill-appearing, toxic-appearing or diaphoretic.   HENT:     Head: Normocephalic.     Right Ear: External ear normal.     Left Ear: External ear normal.     Nose: Nose normal.     Mouth/Throat:     Mouth: Mucous membranes are moist.     Pharynx: Oropharynx is clear.  Eyes:     General:        Right eye: No discharge.        Left eye: No discharge.     Extraocular Movements: Extraocular movements intact.     Conjunctiva/sclera: Conjunctivae normal.     Pupils: Pupils are equal, round, and reactive to light.  Cardiovascular:     Rate and Rhythm: Normal rate and regular rhythm.     Heart sounds: No murmur heard. Pulmonary:     Effort: Pulmonary effort is normal. No respiratory distress.     Breath sounds: Normal breath sounds. No wheezing or rales.  Abdominal:     General: Abdomen is flat. Bowel sounds are normal.     Palpations: Abdomen is soft.     Tenderness: There is abdominal tenderness. There is guarding. There is no right CVA tenderness or left CVA tenderness.  Musculoskeletal:     Cervical back: Normal range of motion and neck supple.  Skin:    General: Skin is warm and dry.  Capillary Refill: Capillary refill takes less than 2 seconds.  Neurological:     General: No focal deficit present.     Mental Status: She is alert and oriented to person, place, and time. Mental status is at baseline.  Psychiatric:        Mood and Affect: Mood normal.        Behavior: Behavior normal.        Thought Content: Thought content normal.        Judgment: Judgment normal.    Results for orders placed or performed in visit on 11/10/20  Von Willebrand panel  Result Value Ref Range   Factor VIII Activity 129 56 - 140 %   Von Willebrand Ag 102 50 - 200 %   Von Willebrand Factor 72 50 - 200 %  Coag Studies Interp Report  Result Value Ref Range   Interpretation Note   Specimen status report  Result Value Ref Range   specimen status report Comment       Assessment & Plan:   Problem List Items Addressed This Visit    None Visit Diagnoses     RUQ abdominal pain    -  Primary   STAT CT ordered to rule out cholecystitis. Labs orderd in office. Will make recommendations based on imaging results.   Relevant Orders   CBC w/Diff   Comp Met (CMET)   Amylase   Lipase   CT Abdomen Pelvis W Contrast        Follow up plan: Return if symptoms worsen or fail to improve.

## 2021-08-03 ENCOUNTER — Encounter: Payer: Self-pay | Admitting: Nurse Practitioner

## 2021-08-03 ENCOUNTER — Ambulatory Visit
Admission: RE | Admit: 2021-08-03 | Discharge: 2021-08-03 | Disposition: A | Discharge status: Home / Self Care | Payer: Commercial Managed Care - PPO | Source: Ambulatory Visit | Attending: Nurse Practitioner | Admitting: Nurse Practitioner

## 2021-08-03 DIAGNOSIS — R1011 Right upper quadrant pain: Secondary | ICD-10-CM | POA: Insufficient documentation

## 2021-08-03 LAB — COMPREHENSIVE METABOLIC PANEL
ALT: 20 IU/L (ref 0–32)
AST: 23 IU/L (ref 0–40)
Albumin/Globulin Ratio: 2.1 (ref 1.2–2.2)
Albumin: 5.1 g/dL — ABNORMAL HIGH (ref 3.9–5.0)
Alkaline Phosphatase: 109 IU/L (ref 44–121)
BUN/Creatinine Ratio: 17 (ref 9–23)
BUN: 13 mg/dL (ref 6–20)
Bilirubin Total: 0.3 mg/dL (ref 0.0–1.2)
CO2: 22 mmol/L (ref 20–29)
Calcium: 10 mg/dL (ref 8.7–10.2)
Chloride: 103 mmol/L (ref 96–106)
Creatinine, Ser: 0.78 mg/dL (ref 0.57–1.00)
Globulin, Total: 2.4 g/dL (ref 1.5–4.5)
Glucose: 92 mg/dL (ref 70–99)
Potassium: 4 mmol/L (ref 3.5–5.2)
Sodium: 145 mmol/L — ABNORMAL HIGH (ref 134–144)
Total Protein: 7.5 g/dL (ref 6.0–8.5)
eGFR: 105 mL/min/{1.73_m2} (ref >59)

## 2021-08-03 LAB — CBC WITH DIFFERENTIAL/PLATELET
Basophils Absolute: 0.1 10*3/uL (ref 0.0–0.2)
Basos: 1 %
EOS (ABSOLUTE): 0.1 10*3/uL (ref 0.0–0.4)
Eos: 1 %
Hematocrit: 44 % (ref 34.0–46.6)
Hemoglobin: 14.8 g/dL (ref 11.1–15.9)
Immature Grans (Abs): 0 10*3/uL (ref 0.0–0.1)
Immature Granulocytes: 0 %
Lymphocytes Absolute: 3.1 10*3/uL (ref 0.7–3.1)
Lymphs: 25 %
MCH: 31.6 pg (ref 26.6–33.0)
MCHC: 33.6 g/dL (ref 31.5–35.7)
MCV: 94 fL (ref 79–97)
Monocytes Absolute: 1.1 10*3/uL — ABNORMAL HIGH (ref 0.1–0.9)
Monocytes: 9 %
Neutrophils Absolute: 8 10*3/uL — ABNORMAL HIGH (ref 1.4–7.0)
Neutrophils: 64 %
Platelets: 404 10*3/uL (ref 150–450)
RBC: 4.68 x10E6/uL (ref 3.77–5.28)
RDW: 11.7 % (ref 11.7–15.4)
WBC: 12.4 10*3/uL — ABNORMAL HIGH (ref 3.4–10.8)

## 2021-08-03 LAB — AMYLASE: Amylase: 67 U/L (ref 31–110)

## 2021-08-03 LAB — LIPASE: Lipase: 30 U/L (ref 14–72)

## 2021-08-03 MED ORDER — IOHEXOL 300 MG/ML  SOLN
100.0000 mL | Freq: Once | INTRAMUSCULAR | Status: AC | PRN
  Administered 2021-08-03: 100 mL via INTRAVENOUS

## 2021-08-03 NOTE — Progress Notes (Signed)
Please let patient know that her white blood cell count is elevated.  This can indicate an infection.  Make sure she keeps the appt for the CT this morning.  I will reach out to her once the results have returned.

## 2021-08-03 NOTE — Progress Notes (Signed)
Please let patient know that her CT was negative.  However, her since he white blood cell count was elevated I'd like to do an ultrasound to look at the gallbladder further.

## 2021-08-03 NOTE — Addendum Note (Signed)
Addended by: Jon Billings on: 08/03/2021 01:03 PM   Modules accepted: Orders

## 2021-08-04 ENCOUNTER — Other Ambulatory Visit: Payer: Self-pay

## 2021-08-04 ENCOUNTER — Ambulatory Visit: Payer: Commercial Managed Care - PPO | Admitting: Family Medicine

## 2021-08-04 ENCOUNTER — Ambulatory Visit: Payer: Commercial Managed Care - PPO

## 2021-08-04 ENCOUNTER — Encounter: Payer: Self-pay | Admitting: Family Medicine

## 2021-08-04 VITALS — BP 103/68 | HR 77 | Temp 98.2°F | Wt 175.0 lb

## 2021-08-04 DIAGNOSIS — R030 Elevated blood-pressure reading, without diagnosis of hypertension: Secondary | ICD-10-CM | POA: Diagnosis not present

## 2021-08-04 DIAGNOSIS — R1011 Right upper quadrant pain: Secondary | ICD-10-CM | POA: Diagnosis not present

## 2021-08-04 MED ORDER — NAPROXEN 500 MG PO TABS: 500.0000 mg | ORAL_TABLET | Freq: Two times a day (BID) | ORAL | 0 refills | Status: DC

## 2021-08-04 MED ORDER — CYCLOBENZAPRINE HCL 10 MG PO TABS: 10.0000 mg | ORAL_TABLET | Freq: Every day | ORAL | 0 refills | Status: DC

## 2021-08-04 MED ORDER — OMEPRAZOLE 20 MG PO CPDR: 20.0000 mg | DELAYED_RELEASE_CAPSULE | Freq: Every day | ORAL | 3 refills | Status: DC

## 2021-08-04 MED ORDER — KETOROLAC TROMETHAMINE 60 MG/2ML IM SOLN
60.0000 mg | Freq: Once | INTRAMUSCULAR | Status: AC
  Administered 2021-08-04: 60 mg via INTRAMUSCULAR

## 2021-08-04 NOTE — Progress Notes (Signed)
BP 103/68    Pulse 77    Temp 98.2 F (36.8 C)    Wt 175 lb (79.4 kg)    SpO2 97%    BMI 27.41 kg/m    Subjective:    Patient ID: Gabrielle Jackson, female    DOB: 12-23-1990, 31 y.o.   MRN: [id number]  HPI: Gabrielle Jackson is a 31 y.o. female  Chief Complaint  Patient presents with   RUQ abdominal pain    Patient continues to have abdominal pain and nausea   blood pressure concern    Patient states her blood pressure has been elevated for the past 2 weeks at home, she feels her face gets flushed    ELEVATED BLOOD PRESSURE Duration of elevated BP: unknown BP monitoring frequency: a few times a month Previous BP meds: none Recent stressors: no Family history of hypertension: yes Recurrent headaches: no Visual changes: no Palpitations: no  Dyspnea: no Chest pain: no Lower extremity edema: no Dizzy/lightheaded: no Transient ischemic attacks: no  ABDOMINAL PAIN  Duration:2-3 days Onset: sudden Severity: severe Quality: sharp Location:  RUQ radiating around the ribs  Episode duration: constant Radiation: yes Frequency: constant Status: stable Treatments attempted: aleve Fever: no Nausea: yes Vomiting: no Weight loss: no Decreased appetite: yes Diarrhea: no Constipation: no Blood in stool: no Heartburn: yes Jaundice: no Rash: no Dysuria/urinary frequency: no Hematuria: no History of sexually transmitted disease: no Recurrent NSAID use: no  Relevant past medical, surgical, family and social history reviewed and updated as indicated. Interim medical history since our last visit reviewed. Allergies and medications reviewed and updated.  Review of Systems  Constitutional: Negative.   Respiratory: Negative.    Cardiovascular: Negative.   Gastrointestinal:  Positive for abdominal pain. Negative for abdominal distention, anal bleeding, blood in stool, constipation, diarrhea, nausea, rectal pain and vomiting.  Musculoskeletal: Negative.   Neurological:  Negative.   Psychiatric/Behavioral: Negative.     Per HPI unless specifically indicated above     Objective:    BP 103/68    Pulse 77    Temp 98.2 F (36.8 C)    Wt 175 lb (79.4 kg)    SpO2 97%    BMI 27.41 kg/m   Wt Readings from Last 3 Encounters:  08/04/21 175 lb (79.4 kg)  08/02/21 178 lb 3.2 oz (80.8 kg)  11/10/20 148 lb (67.1 kg)    Physical Exam Vitals and nursing note reviewed.  Constitutional:      General: She is not in acute distress.    Appearance: Normal appearance. She is not ill-appearing, toxic-appearing or diaphoretic.  HENT:     Head: Normocephalic and atraumatic.     Right Ear: External ear normal.     Left Ear: External ear normal.     Nose: Nose normal.     Mouth/Throat:     Mouth: Mucous membranes are moist.     Pharynx: Oropharynx is clear.  Eyes:     General: No scleral icterus.       Right eye: No discharge.        Left eye: No discharge.     Extraocular Movements: Extraocular movements intact.     Conjunctiva/sclera: Conjunctivae normal.     Pupils: Pupils are equal, round, and reactive to light.  Cardiovascular:     Rate and Rhythm: Normal rate and regular rhythm.     Pulses: Normal pulses.     Heart sounds: Normal heart sounds. No murmur heard.   No friction  rub. No gallop.  Pulmonary:     Effort: Pulmonary effort is normal. No respiratory distress.     Breath sounds: Normal breath sounds. No stridor. No wheezing, rhonchi or rales.  Chest:     Chest wall: No tenderness.  Abdominal:     General: Abdomen is flat. Bowel sounds are normal.     Palpations: Abdomen is soft.     Tenderness: There is abdominal tenderness.    Musculoskeletal:        General: Normal range of motion.     Cervical back: Normal range of motion and neck supple.  Skin:    General: Skin is warm and dry.     Capillary Refill: Capillary refill takes less than 2 seconds.     Coloration: Skin is not jaundiced or pale.     Findings: No bruising, erythema, lesion or  rash.  Neurological:     General: No focal deficit present.     Mental Status: She is alert and oriented to person, place, and time. Mental status is at baseline.  Psychiatric:        Mood and Affect: Mood normal.        Behavior: Behavior normal.        Thought Content: Thought content normal.        Judgment: Judgment normal.    Results for orders placed or performed in visit on 08/02/21  CBC w/Diff  Result Value Ref Range   WBC 12.4 (H) 3.4 - 10.8 x10E3/uL   RBC 4.68 3.77 - 5.28 x10E6/uL   Hemoglobin 14.8 11.1 - 15.9 g/dL   Hematocrit 44.0 34.0 - 46.6 %   MCV 94 79 - 97 fL   MCH 31.6 26.6 - 33.0 pg   MCHC 33.6 31.5 - 35.7 g/dL   RDW 11.7 11.7 - 15.4 %   Platelets 404 150 - 450 x10E3/uL   Neutrophils 64 Not Estab. %   Lymphs 25 Not Estab. %   Monocytes 9 Not Estab. %   Eos 1 Not Estab. %   Basos 1 Not Estab. %   Neutrophils Absolute 8.0 (H) 1.4 - 7.0 x10E3/uL   Lymphocytes Absolute 3.1 0.7 - 3.1 x10E3/uL   Monocytes Absolute 1.1 (H) 0.1 - 0.9 x10E3/uL   EOS (ABSOLUTE) 0.1 0.0 - 0.4 x10E3/uL   Basophils Absolute 0.1 0.0 - 0.2 x10E3/uL   Immature Granulocytes 0 Not Estab. %   Immature Grans (Abs) 0.0 0.0 - 0.1 x10E3/uL  Comp Met (CMET)  Result Value Ref Range   Glucose 92 70 - 99 mg/dL   BUN 13 6 - 20 mg/dL   Creatinine, Ser 0.78 0.57 - 1.00 mg/dL   eGFR 105 >59 mL/min/1.73   BUN/Creatinine Ratio 17 9 - 23   Sodium 145 (H) 134 - 144 mmol/L   Potassium 4.0 3.5 - 5.2 mmol/L   Chloride 103 96 - 106 mmol/L   CO2 22 20 - 29 mmol/L   Calcium 10.0 8.7 - 10.2 mg/dL   Total Protein 7.5 6.0 - 8.5 g/dL   Albumin 5.1 (H) 3.9 - 5.0 g/dL   Globulin, Total 2.4 1.5 - 4.5 g/dL   Albumin/Globulin Ratio 2.1 1.2 - 2.2   Bilirubin Total 0.3 0.0 - 1.2 mg/dL   Alkaline Phosphatase 109 44 - 121 IU/L   AST 23 0 - 40 IU/L   ALT 20 0 - 32 IU/L  Amylase  Result Value Ref Range   Amylase 67 31 - 110 U/L  Lipase  Result Value  Ref Range   Lipase 30 14 - 72 U/L      Assessment & Plan:    Problem List Items Addressed This Visit   None Visit Diagnoses     RUQ abdominal pain    -  Primary   Likely muscular. Imaging normal. Will treat with toradol, flexeril, naproxen and heat. Call if not getting better.    Relevant Medications   ketorolac (TORADOL) injection 60 mg   Elevated BP without diagnosis of hypertension       Normal/Low here. Likely inaccurate cuff. Will have her bring hers in to compare.         Follow up plan: Return As scheduled for April unless not getting better.

## 2021-08-04 NOTE — Progress Notes (Signed)
Patient noticed via mychart.  Has an appt today with PCP.

## 2021-08-31 ENCOUNTER — Other Ambulatory Visit: Payer: Self-pay | Admitting: Family Medicine

## 2021-08-31 NOTE — Telephone Encounter (Signed)
Requested Prescriptions  ?Pending Prescriptions Disp Refills  ?? naproxen (NAPROSYN) 500 MG tablet [Pharmacy Med Name: NAPROXEN $RemoveBefore'500MG'[id number]$  TABLETS] 60 tablet 0  ?  Sig: TAKE 1 TABLET(500 MG) BY MOUTH TWICE DAILY WITH A MEAL  ?  ? Analgesics:  NSAIDS Failed - 08/31/2021  3:31 AM  ?  ?  Failed - Manual Review: Labs are only required if the patient has taken medication for more than 8 weeks.  ?  ?  Passed - Cr in normal range and within 360 days  ?  Creatinine  ?Date Value Ref Range Status  ?10/06/2013 0.66 0.60 - 1.30 mg/dL Final  ? ?Creatinine, Ser  ?Date Value Ref Range Status  ?08/02/2021 0.78 0.57 - 1.00 mg/dL Final  ?   ?  ?  Passed - HGB in normal range and within 360 days  ?  Hemoglobin  ?Date Value Ref Range Status  ?08/02/2021 14.8 11.1 - 15.9 g/dL Final  ?   ?  ?  Passed - PLT in normal range and within 360 days  ?  Platelets  ?Date Value Ref Range Status  ?08/02/2021 404 150 - 450 x10E3/uL Final  ?   ?  ?  Passed - HCT in normal range and within 360 days  ?  Hematocrit  ?Date Value Ref Range Status  ?08/02/2021 44.0 34.0 - 46.6 % Final  ?   ?  ?  Passed - eGFR is 30 or above and within 360 days  ?  EGFR (African American)  ?Date Value Ref Range Status  ?10/06/2013 >60  Final  ? ?GFR calc Af Wyvonnia Lora  ?Date Value Ref Range Status  ?03/22/2020 78 >59 mL/min/1.73 Final  ?  Comment:  ?  **[facility] currently reports eGFR in compliance with the current** ?  recommendations of the [employer]. [facility] will ?  update reporting as new guidelines are published from the NKF-ASN ?  Task force. ?  ? ?EGFR (Non-African Amer.)  ?Date Value Ref Range Status  ?10/06/2013 >60  Final  ?  Comment:  ?  eGFR values <31mL/min/1.73 m2 may be an indication of chronic ?kidney disease (CKD). ?Calculated eGFR is useful in patients with stable renal function. ?The eGFR calculation will not be reliable in acutely ill patients ?when serum creatinine is changing rapidly. It is not useful in  ?patients on dialysis. The eGFR calculation  may not be applicable ?to patients at the low and high extremes of body sizes, pregnant ?women, and vegetarians. ?POTASSIUM - Slight hemolysis, interpret results with ? - caution. ?  ? ?GFR, Estimated  ?Date Value Ref Range Status  ?04/25/2020 >60 >60 mL/min Final  ?  Comment:  ?  (NOTE) ?Calculated using the CKD-EPI Creatinine Equation (2021) ?  ? ?eGFR  ?Date Value Ref Range Status  ?08/02/2021 105 >59 mL/min/1.73 Final  ?   ?  ?  Passed - Patient is not pregnant  ?  ?  Passed - Valid encounter within last 12 months  ?  Recent Outpatient Visits   ?      ? 3 weeks ago RUQ abdominal pain  ? [place], DO  ? 4 weeks ago RUQ abdominal pain  ? [place], NP  ? 3 months ago Adverse effect of drug, initial encounter  ? [place], NP  ? 3 months ago GAD (generalized anxiety disorder)  ? [place], DO  ? 6 months ago  Encounter for initial prescription of vaginal ring hormonal contraceptive  ? [place], [place], DO  ?  ?  ? ?  ?  ?  ? ?

## 2021-10-01 ENCOUNTER — Other Ambulatory Visit: Payer: Self-pay | Admitting: Family Medicine

## 2021-10-03 NOTE — Telephone Encounter (Signed)
Requested Prescriptions  ?Pending Prescriptions Disp Refills  ?? naproxen (NAPROSYN) 500 MG tablet [Pharmacy Med Name: NAPROXEN $RemoveBefore'500MG'[id number]$  TABLETS] 60 tablet 0  ?  Sig: TAKE 1 TABLET(500 MG) BY MOUTH TWICE DAILY WITH A MEAL  ?  ? Analgesics:  NSAIDS Failed - 10/01/2021  3:30 AM  ?  ?  Failed - Manual Review: Labs are only required if the patient has taken medication for more than 8 weeks.  ?  ?  Passed - Cr in normal range and within 360 days  ?  Creatinine  ?Date Value Ref Range Status  ?10/06/2013 0.66 0.60 - 1.30 mg/dL Final  ? ?Creatinine, Ser  ?Date Value Ref Range Status  ?08/02/2021 0.78 0.57 - 1.00 mg/dL Final  ?   ?  ?  Passed - HGB in normal range and within 360 days  ?  Hemoglobin  ?Date Value Ref Range Status  ?08/02/2021 14.8 11.1 - 15.9 g/dL Final  ?   ?  ?  Passed - PLT in normal range and within 360 days  ?  Platelets  ?Date Value Ref Range Status  ?08/02/2021 404 150 - 450 x10E3/uL Final  ?   ?  ?  Passed - HCT in normal range and within 360 days  ?  Hematocrit  ?Date Value Ref Range Status  ?08/02/2021 44.0 34.0 - 46.6 % Final  ?   ?  ?  Passed - eGFR is 30 or above and within 360 days  ?  EGFR (African American)  ?Date Value Ref Range Status  ?10/06/2013 >60  Final  ? ?GFR calc Af Wyvonnia Lora  ?Date Value Ref Range Status  ?03/22/2020 78 >59 mL/min/1.73 Final  ?  Comment:  ?  **[facility] currently reports eGFR in compliance with the current** ?  recommendations of the [employer]. [facility] will ?  update reporting as new guidelines are published from the NKF-ASN ?  Task force. ?  ? ?EGFR (Non-African Amer.)  ?Date Value Ref Range Status  ?10/06/2013 >60  Final  ?  Comment:  ?  eGFR values <85mL/min/1.73 m2 may be an indication of chronic ?kidney disease (CKD). ?Calculated eGFR is useful in patients with stable renal function. ?The eGFR calculation will not be reliable in acutely ill patients ?when serum creatinine is changing rapidly. It is not useful in  ?patients on dialysis. The eGFR calculation  may not be applicable ?to patients at the low and high extremes of body sizes, pregnant ?women, and vegetarians. ?POTASSIUM - Slight hemolysis, interpret results with ? - caution. ?  ? ?GFR, Estimated  ?Date Value Ref Range Status  ?04/25/2020 >60 >60 mL/min Final  ?  Comment:  ?  (NOTE) ?Calculated using the CKD-EPI Creatinine Equation (2021) ?  ? ?eGFR  ?Date Value Ref Range Status  ?08/02/2021 105 >59 mL/min/1.73 Final  ?   ?  ?  Passed - Patient is not pregnant  ?  ?  Passed - Valid encounter within last 12 months  ?  Recent Outpatient Visits   ?      ? 2 months ago RUQ abdominal pain  ? [place] P, DO  ? 2 months ago RUQ abdominal pain  ? [place], NP  ? 4 months ago Adverse effect of drug, initial encounter  ? [employer], NP  ? 4 months ago GAD (generalized anxiety disorder)  ? [place], DO  ? 7 months ago  Encounter for initial prescription of vaginal ring hormonal contraceptive  ? [place], [place] P, DO  ?  ?  ? ?  ?  ?  ? ?

## 2021-10-31 ENCOUNTER — Encounter: Payer: Self-pay | Admitting: Family Medicine

## 2021-10-31 ENCOUNTER — Other Ambulatory Visit: Payer: Self-pay | Admitting: Family Medicine

## 2021-10-31 DIAGNOSIS — F411 Generalized anxiety disorder: Secondary | ICD-10-CM

## 2021-10-31 DIAGNOSIS — F324 Major depressive disorder, single episode, in partial remission: Secondary | ICD-10-CM

## 2021-10-31 DIAGNOSIS — F4010 Social phobia, unspecified: Secondary | ICD-10-CM

## 2021-10-31 MED ORDER — VENLAFAXINE HCL ER 75 MG PO CP24: ORAL_CAPSULE | ORAL | 0 refills | Status: DC

## 2021-12-27 ENCOUNTER — Other Ambulatory Visit: Payer: Self-pay | Admitting: Family Medicine

## 2021-12-27 NOTE — Telephone Encounter (Signed)
Requested Prescriptions  Pending Prescriptions Disp Refills  . naproxen (NAPROSYN) 500 MG tablet [Pharmacy Med Name: NAPROXEN 500MG TABLETS] 60 tablet 0    Sig: TAKE 1 TABLET(500 MG) BY MOUTH TWICE DAILY WITH A MEAL     Analgesics:  NSAIDS Failed - 12/27/2021  3:31 AM      Failed - Manual Review: Labs are only required if the patient has taken medication for more than 8 weeks.      Passed - Cr in normal range and within 360 days    Creatinine  Date Value Ref Range Status  10/06/2013 0.66 0.60 - 1.30 mg/dL Final   Creatinine, Ser  Date Value Ref Range Status  08/02/2021 0.78 0.57 - 1.00 mg/dL Final         Passed - HGB in normal range and within 360 days    Hemoglobin  Date Value Ref Range Status  08/02/2021 14.8 11.1 - 15.9 g/dL Final         Passed - PLT in normal range and within 360 days    Platelets  Date Value Ref Range Status  08/02/2021 404 150 - 450 x10E3/uL Final         Passed - HCT in normal range and within 360 days    Hematocrit  Date Value Ref Range Status  08/02/2021 44.0 34.0 - 46.6 % Final         Passed - eGFR is 30 or above and within 360 days    EGFR (African American)  Date Value Ref Range Status  10/06/2013 >60  Final   GFR calc Af Amer  Date Value Ref Range Status  03/22/2020 78 >59 mL/min/1.73 Final    Comment:    **[facility] currently reports eGFR in compliance with the current**   recommendations of the [employer]. [facility] will   update reporting as new guidelines are published from the NKF-ASN   Task force.    EGFR (Non-African Amer.)  Date Value Ref Range Status  10/06/2013 >60  Final    Comment:    eGFR values <62m/min/1.73 m2 may be an indication of chronic kidney disease (CKD). Calculated eGFR is useful in patients with stable renal function. The eGFR calculation will not be reliable in acutely ill patients when serum creatinine is changing rapidly. It is not useful in  patients on dialysis. The eGFR calculation  may not be applicable to patients at the low and high extremes of body sizes, pregnant women, and vegetarians. POTASSIUM - Slight hemolysis, interpret results with  - caution.    GFR, Estimated  Date Value Ref Range Status  04/25/2020 >60 >60 mL/min Final    Comment:    (NOTE) Calculated using the CKD-EPI Creatinine Equation (2021)    eGFR  Date Value Ref Range Status  08/02/2021 105 >59 mL/min/1.73 Final         Passed - Patient is not pregnant      Passed - Valid encounter within last 12 months    Recent Outpatient Visits          4 months ago RUQ abdominal pain   [place], [place]   4 months ago RUQ abdominal pain   [place]   7 months ago Adverse effect of drug, initial encounter   [place]   7 months ago GAD (generalized anxiety disorder)   [place], [place]   10 months ago  Encounter for initial prescription of vaginal ring hormonal contraceptive   [place], [place]

## 2022-01-01 ENCOUNTER — Telehealth: Payer: Commercial Managed Care - PPO | Admitting: Physician Assistant

## 2022-01-01 DIAGNOSIS — T3695XA Adverse effect of unspecified systemic antibiotic, initial encounter: Secondary | ICD-10-CM | POA: Diagnosis not present

## 2022-01-01 DIAGNOSIS — B379 Candidiasis, unspecified: Secondary | ICD-10-CM

## 2022-01-01 DIAGNOSIS — H66001 Acute suppurative otitis media without spontaneous rupture of ear drum, right ear: Secondary | ICD-10-CM | POA: Diagnosis not present

## 2022-01-01 MED ORDER — NEOMYCIN-POLYMYXIN-HC 3.5-10000-1 OT SOLN: 3.0000 [drp] | Freq: Four times a day (QID) | OTIC | 0 refills | Status: DC

## 2022-01-01 MED ORDER — FLUCONAZOLE 150 MG PO TABS: 150.0000 mg | ORAL_TABLET | ORAL | 0 refills | Status: DC | PRN

## 2022-01-01 MED ORDER — DOXYCYCLINE HYCLATE 100 MG PO TABS
100.0000 mg | ORAL_TABLET | Freq: Two times a day (BID) | ORAL | 0 refills | Status: DC
Start: 2022-01-01 — End: 2022-03-22

## 2022-01-01 MED ORDER — BENZONATATE 100 MG PO CAPS
100.0000 mg | ORAL_CAPSULE | Freq: Three times a day (TID) | ORAL | 0 refills | Status: DC | PRN
Start: 2022-01-01 — End: 2022-03-22

## 2022-01-01 NOTE — Patient Instructions (Signed)
Gabrielle Jackson, thank you for joining Mar Daring, PA-C for today's virtual visit.  While this provider is not your primary care provider (PCP), if your PCP is located in our provider database this encounter information will be shared with them immediately following your visit.  Consent: (Patient) Gabrielle Jackson provided verbal consent for this virtual visit at the beginning of the encounter.  Current Medications:  Current Outpatient Medications:    benzonatate (TESSALON) 100 MG capsule, Take 1 capsule (100 mg total) by mouth 3 (three) times daily as needed., Disp: 30 capsule, Rfl: 0   doxycycline (VIBRA-TABS) 100 MG tablet, Take 1 tablet (100 mg total) by mouth 2 (two) times daily., Disp: 20 tablet, Rfl: 0   fluconazole (DIFLUCAN) 150 MG tablet, Take 1 tablet (150 mg total) by mouth every 3 (three) days as needed., Disp: 2 tablet, Rfl: 0   neomycin-polymyxin-hydrocortisone (CORTISPORIN) OTIC solution, Place 3 drops into the right ear 4 (four) times daily. X 7 days, Disp: 10 mL, Rfl: 0   naproxen (NAPROSYN) 500 MG tablet, TAKE 1 TABLET(500 MG) BY MOUTH TWICE DAILY WITH A MEAL, Disp: 60 tablet, Rfl: 0   venlafaxine XR (EFFEXOR-XR) 75 MG 24 hr capsule, TAKE 1 CAPSULE BY MOUTH EVERY DAY WITH BREAKFAST, Disp: 90 capsule, Rfl: 0   Medications ordered in this encounter:  Meds ordered this encounter  Medications   doxycycline (VIBRA-TABS) 100 MG tablet    Sig: Take 1 tablet (100 mg total) by mouth 2 (two) times daily.    Dispense:  20 tablet    Refill:  0    Order Specific Question:   Supervising Provider    Answer:   MILLER, BRIAN [[id number]]   neomycin-polymyxin-hydrocortisone (CORTISPORIN) OTIC solution    Sig: Place 3 drops into the right ear 4 (four) times daily. X 7 days    Dispense:  10 mL    Refill:  0    Order Specific Question:   Supervising Provider    Answer:   MILLER, BRIAN [[id number]]   benzonatate (TESSALON) 100 MG capsule    Sig: Take 1 capsule (100 mg total) by  mouth 3 (three) times daily as needed.    Dispense:  30 capsule    Refill:  0    Order Specific Question:   Supervising Provider    Answer:   MILLER, BRIAN [[id number]]   fluconazole (DIFLUCAN) 150 MG tablet    Sig: Take 1 tablet (150 mg total) by mouth every 3 (three) days as needed.    Dispense:  2 tablet    Refill:  0    Order Specific Question:   Supervising Provider    Answer:   Sabra Heck, BRIAN [[id number]]     *If you need refills on other medications prior to your next appointment, please contact your pharmacy*  Follow-Up: Call back or seek an in-person evaluation if the symptoms worsen or if the condition fails to improve as anticipated.  Other Instructions Otitis Media, Adult  Otitis media occurs when there is inflammation and fluid in the middle ear with signs and symptoms of an acute infection. The middle ear is a part of the ear that contains bones for hearing as well as air that helps send sounds to the brain. When infected fluid builds up in this space, it causes pressure and can lead to an ear infection. The eustachian tube connects the middle ear to the back of the nose (nasopharynx) and normally allows air into the middle ear. If the eustachian  tube becomes blocked, fluid can build up and become infected. What are the causes? This condition is caused by a blockage in the eustachian tube. This can be caused by mucus or by swelling of the tube. Problems that can cause a blockage include: A cold or other upper respiratory infection. Allergies. An irritant, such as tobacco smoke. Enlarged adenoids. The adenoids are areas of soft tissue located high in the back of the throat, behind the nose and the roof of the mouth. They are part of the body's defense system (immune system). A mass in the nasopharynx. Damage to the ear caused by pressure changes (barotrauma). What increases the risk? You are more likely to develop this condition if you: Smoke or are exposed to tobacco smoke. Have an  opening in the roof of your mouth (cleft palate). Have gastroesophageal reflux. Have an immune system disorder. What are the signs or symptoms? Symptoms of this condition include: Ear pain. Fever. Decreased hearing. Tiredness (lethargy). Fluid leaking from the ear, if the eardrum is ruptured or has burst. Ringing in the ear. How is this diagnosed?  This condition is diagnosed with a physical exam. During the exam, your health care provider will use an instrument called an otoscope to look in your ear and check for redness, swelling, and fluid. He or she will also ask about your symptoms. Your health care provider may also order tests, such as: A pneumatic otoscopy. This is a test to check the movement of the eardrum. It is done by squeezing a small amount of air into the ear. A tympanogram. This is a test that shows how well the eardrum moves in response to air pressure in the ear canal. It provides a graph for your health care provider to review. How is this treated? This condition can go away on its own within 3-5 days. But if the condition is caused by a bacterial infection and does not go away on its own, or if it keeps coming back, your health care provider may: Prescribe antibiotic medicine to treat the infection. Prescribe or recommend medicines to control pain. Follow these instructions at home: Take over-the-counter and prescription medicines only as told by your health care provider. If you were prescribed an antibiotic medicine, take it as told by your health care provider. Do not stop taking the antibiotic even if you start to feel better. Keep all follow-up visits. This is important. Contact a health care provider if: You have bleeding from your nose. There is a lump on your neck. You are not feeling better in 5 days. You feel worse instead of better. Get help right away if: You have severe pain that is not controlled with medicine. You have swelling, redness, or pain  around your ear. You have stiffness in your neck. A part of your face is not moving (paralyzed). The bone behind your ear (mastoid bone) is tender when you touch it. You develop a severe headache. Summary Otitis media is redness, soreness, and swelling of the middle ear, usually resulting in pain and decreased hearing. This condition can go away on its own within 3-5 days. If the problem does not go away in 3-5 days, your health care provider may give you medicines to treat the infection. If you were prescribed an antibiotic medicine, take it as told by your health care provider. Follow all instructions that were given to you by your health care provider. This information is not intended to replace advice given to you by your health  care provider. Make sure you discuss any questions you have with your health care provider. Document Revised: 09/26/2020 Document Reviewed: 09/26/2020 Elsevier Patient Education  [place].    If you have been instructed to have an in-person evaluation today at a local Urgent Care facility, please use the link below. It will take you to a list of all of our available [facility], including address, phone number and hours of operation. Please do not delay care.  [facility]  If you or a family member do not have a primary care provider, use the link below to schedule a visit and establish care. When you choose a [facility] primary care physician or advanced practice provider, you gain a long-term partner in health. Find a Primary Care Provider  Learn more about [facility] in-office and virtual care options: [facility]

## 2022-01-01 NOTE — Progress Notes (Signed)
Virtual Visit Consent   Gabrielle Jackson, you are scheduled for a virtual visit with a [facility] provider today. Just as with appointments in the office, your consent must be obtained to participate. Your consent will be active for this visit and any virtual visit you may have with one of our providers in the next 365 days. If you have a MyChart account, a copy of this consent can be sent to you electronically.  As this is a virtual visit, video technology does not allow for your provider to perform a traditional examination. This may limit your provider's ability to fully assess your condition. If your provider identifies any concerns that need to be evaluated in person or the need to arrange testing (such as labs, EKG, etc.), we will make arrangements to do so. Although advances in technology are sophisticated, we cannot ensure that it will always work on either your end or our end. If the connection with a video visit is poor, the visit may have to be switched to a telephone visit. With either a video or telephone visit, we are not always able to ensure that we have a secure connection.  By engaging in this virtual visit, you consent to the provision of healthcare and authorize for your insurance to be billed (if applicable) for the services provided during this visit. Depending on your insurance coverage, you may receive a charge related to this service.  I need to obtain your verbal consent now. Are you willing to proceed with your visit today? Gabrielle Jackson has provided verbal consent on 01/01/2022 for a virtual visit (video or telephone). Mar Daring, PA-C  Date: 01/01/2022 9:39 AM  Virtual Visit via Video Note   I, Mar Daring, connected with  Gabrielle Jackson  ([id number], 1991-04-05) on 01/01/22 at  9:30 AM EDT by a video-enabled telemedicine application and verified that I am speaking with the correct person using two identifiers.  Location: Patient: Virtual Visit  Location Patient: Home Provider: Virtual Visit Location Provider: Home Office   I discussed the limitations of evaluation and management by telemedicine and the availability of in person appointments. The patient expressed understanding and agreed to proceed.    History of Present Illness: Gabrielle Jackson is a 31 y.o. who identifies as a female who was assigned female at birth, and is being seen today for sore throat.  HPI: Sore Throat  This is a new problem. The current episode started 1 to 4 weeks ago. The problem has been gradually worsening. There has been no fever. Associated symptoms include coughing, ear pain (right only), headaches and a plugged ear sensation. Pertinent negatives include no ear discharge, hoarse voice, neck pain, shortness of breath, swollen glands or trouble swallowing. She has had no exposure to strep or mono. She has tried NSAIDs (aleve, claritin) for the symptoms. The treatment provided no relief.      Problems:  Patient Active Problem List   Diagnosis Date Noted   Cellulitis of finger of right hand 02/16/2020   MDD (major depressive disorder), single episode, in partial remission ([place]) 05/12/2019   Social anxiety disorder 04/01/2019   Bereavement 03/13/2019   Depressive disorder 02/13/2019   GAD (generalized anxiety disorder) 09/05/2015   Partial hamstring tear 05/02/2015    Allergies:  Allergies  Allergen Reactions   Penicillins Rash   Medications:  Current Outpatient Medications:    benzonatate (TESSALON) 100 MG capsule, Take 1 capsule (100 mg total) by mouth 3 (three) times  daily as needed., Disp: 30 capsule, Rfl: 0   doxycycline (VIBRA-TABS) 100 MG tablet, Take 1 tablet (100 mg total) by mouth 2 (two) times daily., Disp: 20 tablet, Rfl: 0   fluconazole (DIFLUCAN) 150 MG tablet, Take 1 tablet (150 mg total) by mouth every 3 (three) days as needed., Disp: 2 tablet, Rfl: 0   neomycin-polymyxin-hydrocortisone (CORTISPORIN) OTIC solution, Place 3  drops into the right ear 4 (four) times daily. X 7 days, Disp: 10 mL, Rfl: 0   naproxen (NAPROSYN) 500 MG tablet, TAKE 1 TABLET(500 MG) BY MOUTH TWICE DAILY WITH A MEAL, Disp: 60 tablet, Rfl: 0   venlafaxine XR (EFFEXOR-XR) 75 MG 24 hr capsule, TAKE 1 CAPSULE BY MOUTH EVERY DAY WITH BREAKFAST, Disp: 90 capsule, Rfl: 0  Observations/Objective: Patient is well-developed, well-nourished in no acute distress.  Resting comfortably at home.  Head is normocephalic, atraumatic.  No labored breathing.  Speech is clear and coherent with logical content.  Patient is alert and oriented at baseline.    Assessment and Plan: 1. Non-recurrent acute suppurative otitis media of right ear without spontaneous rupture of tympanic membrane - doxycycline (VIBRA-TABS) 100 MG tablet; Take 1 tablet (100 mg total) by mouth 2 (two) times daily.  Dispense: 20 tablet; Refill: 0 - neomycin-polymyxin-hydrocortisone (CORTISPORIN) OTIC solution; Place 3 drops into the right ear 4 (four) times daily. X 7 days  Dispense: 10 mL; Refill: 0 - benzonatate (TESSALON) 100 MG capsule; Take 1 capsule (100 mg total) by mouth 3 (three) times daily as needed.  Dispense: 30 capsule; Refill: 0  2. Antibiotic-induced yeast infection - fluconazole (DIFLUCAN) 150 MG tablet; Take 1 tablet (150 mg total) by mouth every 3 (three) days as needed.  Dispense: 2 tablet; Refill: 0  - Worsening symptoms that have not responded to OTC medications.  - Will give Doxycycline and Cortisporin drops - Continue allergy medications.  - Steam and humidifier can help - Stay well hydrated and get plenty of rest.  - Diflucan given as prophylaxis as patient tends to get vaginal yeast infections with antibiotic use - Seek in person evaluation if no symptom improvement or if symptoms worsen   Follow Up Instructions: I discussed the assessment and treatment plan with the patient. The patient was provided an opportunity to ask questions and all were answered.  The patient agreed with the plan and demonstrated an understanding of the instructions.  A copy of instructions were sent to the patient via MyChart unless otherwise noted below.   The patient was advised to call back or seek an in-person evaluation if the symptoms worsen or if the condition fails to improve as anticipated.  Time:  I spent 10 minutes with the patient via telehealth technology discussing the above problems/concerns.    Mar Daring, PA-C

## 2022-01-23 ENCOUNTER — Encounter: Payer: Self-pay | Admitting: Family Medicine

## 2022-01-28 ENCOUNTER — Other Ambulatory Visit: Payer: Self-pay | Admitting: Family Medicine

## 2022-01-28 DIAGNOSIS — F324 Major depressive disorder, single episode, in partial remission: Secondary | ICD-10-CM

## 2022-01-28 DIAGNOSIS — F4010 Social phobia, unspecified: Secondary | ICD-10-CM

## 2022-01-28 DIAGNOSIS — F411 Generalized anxiety disorder: Secondary | ICD-10-CM

## 2022-01-29 NOTE — Telephone Encounter (Signed)
Requested Prescriptions  Pending Prescriptions Disp Refills  . venlafaxine XR (EFFEXOR-XR) 75 MG 24 hr capsule [Pharmacy Med Name: VENLAFAXINE ER 75MG CAPSULES] 90 capsule 0    Sig: TAKE 1 CAPSULE BY MOUTH EVERY DAY WITH BREAKFAST     Psychiatry: Antidepressants - SNRI - desvenlafaxine & venlafaxine Failed - 01/28/2022  3:30 AM      Failed - Lipid Panel in normal range within the last 12 months    Cholesterol, Total  Date Value Ref Range Status  03/22/2020 106 100 - 199 mg/dL Final   LDL Chol Calc (NIH)  Date Value Ref Range Status  03/22/2020 40 0 - 99 mg/dL Final   HDL  Date Value Ref Range Status  03/22/2020 50 >39 mg/dL Final   Triglycerides  Date Value Ref Range Status  03/22/2020 77 0 - 149 mg/dL Final         Passed - Cr in normal range and within 360 days    Creatinine  Date Value Ref Range Status  10/06/2013 0.66 0.60 - 1.30 mg/dL Final   Creatinine, Ser  Date Value Ref Range Status  08/02/2021 0.78 0.57 - 1.00 mg/dL Final         Passed - Completed PHQ-2 or PHQ-9 in the last 360 days      Passed - Last BP in normal range    BP Readings from Last 1 Encounters:  08/04/21 103/68         Passed - Valid encounter within last 6 months    Recent Outpatient Visits          5 months ago RUQ abdominal pain   [place], Megan P, DO   6 months ago RUQ abdominal pain   [place], Santiago Glad, NP   8 months ago Adverse effect of drug, initial encounter   [place], Lauren A, NP   8 months ago GAD (generalized anxiety disorder)   [facility] Johnson, Megan P, DO   11 months ago Encounter for initial prescription of vaginal ring hormonal contraceptive   [employer], Megan P, DO             . naproxen (NAPROSYN) 500 MG tablet [Pharmacy Med Name: NAPROXEN 500MG TABLETS] 60 tablet 0    Sig: TAKE 1 TABLET(500 MG) BY MOUTH TWICE DAILY WITH A MEAL     Analgesics:   NSAIDS Failed - 01/28/2022  3:30 AM      Failed - Manual Review: Labs are only required if the patient has taken medication for more than 8 weeks.      Passed - Cr in normal range and within 360 days    Creatinine  Date Value Ref Range Status  10/06/2013 0.66 0.60 - 1.30 mg/dL Final   Creatinine, Ser  Date Value Ref Range Status  08/02/2021 0.78 0.57 - 1.00 mg/dL Final         Passed - HGB in normal range and within 360 days    Hemoglobin  Date Value Ref Range Status  08/02/2021 14.8 11.1 - 15.9 g/dL Final         Passed - PLT in normal range and within 360 days    Platelets  Date Value Ref Range Status  08/02/2021 404 150 - 450 x10E3/uL Final         Passed - HCT in normal range and within 360 days    Hematocrit  Date Value Ref Range Status  08/02/2021 44.0  34.0 - 46.6 % Final         Passed - eGFR is 30 or above and within 360 days    EGFR (African American)  Date Value Ref Range Status  10/06/2013 >60  Final   GFR calc Af Amer  Date Value Ref Range Status  03/22/2020 78 >59 mL/min/1.73 Final    Comment:    **[facility] currently reports eGFR in compliance with the current**   recommendations of the [employer]. [facility] will   update reporting as new guidelines are published from the NKF-ASN   Task force.    EGFR (Non-African Amer.)  Date Value Ref Range Status  10/06/2013 >60  Final    Comment:    eGFR values <74m/min/1.73 m2 may be an indication of chronic kidney disease (CKD). Calculated eGFR is useful in patients with stable renal function. The eGFR calculation will not be reliable in acutely ill patients when serum creatinine is changing rapidly. It is not useful in  patients on dialysis. The eGFR calculation may not be applicable to patients at the low and high extremes of body sizes, pregnant women, and vegetarians. POTASSIUM - Slight hemolysis, interpret results with  - caution.    GFR, Estimated  Date Value Ref Range Status   04/25/2020 >60 >60 mL/min Final    Comment:    (NOTE) Calculated using the CKD-EPI Creatinine Equation (2021)    eGFR  Date Value Ref Range Status  08/02/2021 105 >59 mL/min/1.73 Final         Passed - Patient is not pregnant      Passed - Valid encounter within last 12 months    Recent Outpatient Visits          5 months ago RUQ abdominal pain   [facility], [place]   6 months ago RUQ abdominal pain   [facility]   8 months ago Adverse effect of drug, initial encounter   [place] Lauren A, NP   8 months ago GAD (generalized anxiety disorder)   [place] Megan [place], DO   11 months ago Encounter for initial prescription of vaginal ring hormonal contraceptive   [place] DO

## 2022-01-29 NOTE — Telephone Encounter (Signed)
Requested medication (s) are due for refill today: yes  Requested medication (s) are on the active medication list: yes  Last refill:  10/31/21 #90 with 0 RF  Future visit scheduled: no, seen 08/04/21 in office, [facility] virtual 01/01/22  Notes to clinic:  Failed protocol of labs within 12 months, lipids last checked 2021, no upcoming appt, was seen in Feb, and virtual in July, please assess.       Requested Prescriptions  Pending Prescriptions Disp Refills   venlafaxine XR (EFFEXOR-XR) 75 MG 24 hr capsule [Pharmacy Med Name: VENLAFAXINE ER 75MG CAPSULES] 90 capsule 0    Sig: TAKE 1 CAPSULE BY MOUTH EVERY DAY WITH BREAKFAST     Psychiatry: Antidepressants - SNRI - desvenlafaxine & venlafaxine Failed - 01/28/2022  3:30 AM      Failed - Lipid Panel in normal range within the last 12 months    Cholesterol, Total  Date Value Ref Range Status  03/22/2020 106 100 - 199 mg/dL Final   LDL Chol Calc (NIH)  Date Value Ref Range Status  03/22/2020 40 0 - 99 mg/dL Final   HDL  Date Value Ref Range Status  03/22/2020 50 >39 mg/dL Final   Triglycerides  Date Value Ref Range Status  03/22/2020 77 0 - 149 mg/dL Final         Passed - Cr in normal range and within 360 days    Creatinine  Date Value Ref Range Status  10/06/2013 0.66 0.60 - 1.30 mg/dL Final   Creatinine, Ser  Date Value Ref Range Status  08/02/2021 0.78 0.57 - 1.00 mg/dL Final         Passed - Completed PHQ-2 or PHQ-9 in the last 360 days      Passed - Last BP in normal range    BP Readings from Last 1 Encounters:  08/04/21 103/68         Passed - Valid encounter within last 6 months    Recent Outpatient Visits           5 months ago RUQ abdominal pain   [place], Megan P, DO   6 months ago RUQ abdominal pain   [place], Santiago Glad, NP   8 months ago Adverse effect of drug, initial encounter   [place], Lauren A, NP   8 months ago GAD  (generalized anxiety disorder)   [facility] Johnson, Megan P, DO   11 months ago Encounter for initial prescription of vaginal ring hormonal contraceptive   [employer], [place], DO              Signed Prescriptions Disp Refills   naproxen (NAPROSYN) 500 MG tablet 60 tablet 0    Sig: TAKE 1 TABLET(500 MG) BY MOUTH TWICE DAILY WITH A MEAL     Analgesics:  NSAIDS Failed - 01/28/2022  3:30 AM      Failed - Manual Review: Labs are only required if the patient has taken medication for more than 8 weeks.      Passed - Cr in normal range and within 360 days    Creatinine  Date Value Ref Range Status  10/06/2013 0.66 0.60 - 1.30 mg/dL Final   Creatinine, Ser  Date Value Ref Range Status  08/02/2021 0.78 0.57 - 1.00 mg/dL Final         Passed - HGB in normal range and within 360 days    Hemoglobin  Date Value Ref  Range Status  08/02/2021 14.8 11.1 - 15.9 g/dL Final         Passed - PLT in normal range and within 360 days    Platelets  Date Value Ref Range Status  08/02/2021 404 150 - 450 x10E3/uL Final         Passed - HCT in normal range and within 360 days    Hematocrit  Date Value Ref Range Status  08/02/2021 44.0 34.0 - 46.6 % Final         Passed - eGFR is 30 or above and within 360 days    EGFR (African American)  Date Value Ref Range Status  10/06/2013 >60  Final   GFR calc Af Amer  Date Value Ref Range Status  03/22/2020 78 >59 mL/min/1.73 Final    Comment:    **[facility] currently reports eGFR in compliance with the current**   recommendations of the [employer]. [facility] will   update reporting as new guidelines are published from the NKF-ASN   Task force.    EGFR (Non-African Amer.)  Date Value Ref Range Status  10/06/2013 >60  Final    Comment:    eGFR values <12m/min/1.73 m2 may be an indication of chronic kidney disease (CKD). Calculated eGFR is useful in patients with stable renal function. The  eGFR calculation will not be reliable in acutely ill patients when serum creatinine is changing rapidly. It is not useful in  patients on dialysis. The eGFR calculation may not be applicable to patients at the low and high extremes of body sizes, pregnant women, and vegetarians. POTASSIUM - Slight hemolysis, interpret results with  - caution.    GFR, Estimated  Date Value Ref Range Status  04/25/2020 >60 >60 mL/min Final    Comment:    (NOTE) Calculated using the CKD-EPI Creatinine Equation (2021)    eGFR  Date Value Ref Range Status  08/02/2021 105 >59 mL/min/1.73 Final         Passed - Patient is not pregnant      Passed - Valid encounter within last 12 months    Recent Outpatient Visits           5 months ago RUQ abdominal pain   [place] Megan P, DO   6 months ago RUQ abdominal pain   [facility]   8 months ago Adverse effect of drug, initial encounter   [place] Lauren A, NP   8 months ago GAD (generalized anxiety disorder)   [facility], DO   11 months ago Encounter for initial prescription of vaginal ring hormonal contraceptive   [place] DO

## 2022-01-30 NOTE — Telephone Encounter (Signed)
Patient states she will call back to schedule an appointment

## 2022-01-30 NOTE — Telephone Encounter (Signed)
Needs appt

## 2022-02-27 ENCOUNTER — Other Ambulatory Visit: Payer: Self-pay | Admitting: Family Medicine

## 2022-02-27 DIAGNOSIS — F411 Generalized anxiety disorder: Secondary | ICD-10-CM

## 2022-02-27 DIAGNOSIS — F324 Major depressive disorder, single episode, in partial remission: Secondary | ICD-10-CM

## 2022-02-27 DIAGNOSIS — F4010 Social phobia, unspecified: Secondary | ICD-10-CM

## 2022-02-27 NOTE — Telephone Encounter (Signed)
Requested medication (s) are due for refill today: yes  Requested medication (s) are on the active medication list: yes  Last refill:  Naprosyn 01/29/22 #60 with 0 RF, Effexor 01/30/22 #30 with 0 RF  Future visit scheduled: no, last seen 08/04/21  Notes to clinic:  Has already had a curtesy refill and there is no upcoming appointment scheduled, Labs are also out of date range of protocol, please assess.    Requested Prescriptions  Pending Prescriptions Disp Refills   venlafaxine XR (EFFEXOR-XR) 75 MG 24 hr capsule [Pharmacy Med Name: VENLAFAXINE ER 75MG CAPSULES] 30 capsule 0    Sig: TAKE 1 CAPSULE BY MOUTH EVERY DAY WITH BREAKFAST     Psychiatry: Antidepressants - SNRI - desvenlafaxine & venlafaxine Failed - 02/27/2022  3:30 AM      Failed - Valid encounter within last 6 months    Recent Outpatient Visits           6 months ago RUQ abdominal pain   [place], Megan P, DO   6 months ago RUQ abdominal pain   [facility] Jon Billings, NP   9 months ago Adverse effect of drug, initial encounter   [place], Lauren A, NP   9 months ago GAD (generalized anxiety disorder)   [place], Megan P, DO   1 year ago Encounter for initial prescription of vaginal ring hormonal contraceptive   [facility] [place], Megan P, DO              Failed - Lipid Panel in normal range within the last 12 months    Cholesterol, Total  Date Value Ref Range Status  03/22/2020 106 100 - 199 mg/dL Final   LDL Chol Calc (NIH)  Date Value Ref Range Status  03/22/2020 40 0 - 99 mg/dL Final   HDL  Date Value Ref Range Status  03/22/2020 50 >39 mg/dL Final   Triglycerides  Date Value Ref Range Status  03/22/2020 77 0 - 149 mg/dL Final         Passed - Cr in normal range and within 360 days    Creatinine  Date Value Ref Range Status  10/06/2013 0.66 0.60 - 1.30 mg/dL Final   Creatinine, Ser  Date  Value Ref Range Status  08/02/2021 0.78 0.57 - 1.00 mg/dL Final         Passed - Completed PHQ-2 or PHQ-9 in the last 360 days      Passed - Last BP in normal range    BP Readings from Last 1 Encounters:  08/04/21 103/68          naproxen (NAPROSYN) 500 MG tablet [Pharmacy Med Name: NAPROXEN 500MG TABLETS] 60 tablet 0    Sig: TAKE 1 TABLET(500 MG) BY MOUTH TWICE DAILY WITH A MEAL     Analgesics:  NSAIDS Failed - 02/27/2022  3:30 AM      Failed - Manual Review: Labs are only required if the patient has taken medication for more than 8 weeks.      Passed - Cr in normal range and within 360 days    Creatinine  Date Value Ref Range Status  10/06/2013 0.66 0.60 - 1.30 mg/dL Final   Creatinine, Ser  Date Value Ref Range Status  08/02/2021 0.78 0.57 - 1.00 mg/dL Final         Passed - HGB in normal range and within 360 days    Hemoglobin  Date Value  Ref Range Status  08/02/2021 14.8 11.1 - 15.9 g/dL Final         Passed - PLT in normal range and within 360 days    Platelets  Date Value Ref Range Status  08/02/2021 404 150 - 450 x10E3/uL Final         Passed - HCT in normal range and within 360 days    Hematocrit  Date Value Ref Range Status  08/02/2021 44.0 34.0 - 46.6 % Final         Passed - eGFR is 30 or above and within 360 days    EGFR (African American)  Date Value Ref Range Status  10/06/2013 >60  Final   GFR calc Af Amer  Date Value Ref Range Status  03/22/2020 78 >59 mL/min/1.73 Final    Comment:    **[facility] currently reports eGFR in compliance with the current**   recommendations of the [employer]. [facility] will   update reporting as new guidelines are published from the NKF-ASN   Task force.    EGFR (Non-African Amer.)  Date Value Ref Range Status  10/06/2013 >60  Final    Comment:    eGFR values <92m/min/1.73 m2 may be an indication of chronic kidney disease (CKD). Calculated eGFR is useful in patients with stable renal  function. The eGFR calculation will not be reliable in acutely ill patients when serum creatinine is changing rapidly. It is not useful in  patients on dialysis. The eGFR calculation may not be applicable to patients at the low and high extremes of body sizes, pregnant women, and vegetarians. POTASSIUM - Slight hemolysis, interpret results with  - caution.    GFR, Estimated  Date Value Ref Range Status  04/25/2020 >60 >60 mL/min Final    Comment:    (NOTE) Calculated using the CKD-EPI Creatinine Equation (2021)    eGFR  Date Value Ref Range Status  08/02/2021 105 >59 mL/min/1.73 Final         Passed - Patient is not pregnant      Passed - Valid encounter within last 12 months    Recent Outpatient Visits           6 months ago RUQ abdominal pain   [place] Megan P, DO   6 months ago RUQ abdominal pain   [facility]   9 months ago Adverse effect of drug, initial encounter   [place] A, NP   9 months ago GAD (generalized anxiety disorder)   [place] P, DO   1 year ago Encounter for initial prescription of vaginal ring hormonal contraceptive   [place] [address] DO

## 2022-03-05 NOTE — Telephone Encounter (Signed)
appt

## 2022-03-06 NOTE — Telephone Encounter (Signed)
LVM asking patient to call back to schedule an appointment 

## 2022-03-22 ENCOUNTER — Emergency Department: Payer: Commercial Managed Care - PPO

## 2022-03-22 ENCOUNTER — Emergency Department
Admission: EM | Admit: 2022-03-22 | Discharge: 2022-03-23 | Disposition: A | Discharge status: Home / Self Care | Payer: Commercial Managed Care - PPO | Attending: Emergency Medicine | Admitting: Emergency Medicine

## 2022-03-22 ENCOUNTER — Other Ambulatory Visit: Payer: Self-pay

## 2022-03-22 DIAGNOSIS — R109 Unspecified abdominal pain: Secondary | ICD-10-CM | POA: Diagnosis present

## 2022-03-22 DIAGNOSIS — Z87442 Personal history of urinary calculi: Secondary | ICD-10-CM | POA: Diagnosis not present

## 2022-03-22 LAB — URINALYSIS, ROUTINE W REFLEX MICROSCOPIC
Bacteria, UA: NONE SEEN
Bilirubin Urine: NEGATIVE
Glucose, UA: NEGATIVE mg/dL
Ketones, ur: 40 mg/dL — AB
Leukocytes,Ua: NEGATIVE
Nitrite: NEGATIVE
Protein, ur: NEGATIVE mg/dL
Specific Gravity, Urine: 1.015 (ref 1.005–1.030)
pH: 7 (ref 5.0–8.0)

## 2022-03-22 LAB — CBC
HCT: 41.7 % (ref 36.0–46.0)
Hemoglobin: 14.6 g/dL (ref 12.0–15.0)
MCH: 31.9 pg (ref 26.0–34.0)
MCHC: 35 g/dL (ref 30.0–36.0)
MCV: 91.2 fL (ref 80.0–100.0)
Platelets: 347 10*3/uL (ref 150–400)
RBC: 4.57 MIL/uL (ref 3.87–5.11)
RDW: 11.5 % (ref 11.5–15.5)
WBC: 12.4 10*3/uL — ABNORMAL HIGH (ref 4.0–10.5)
nRBC: 0 % (ref 0.0–0.2)

## 2022-03-22 LAB — BASIC METABOLIC PANEL
Anion gap: 11 (ref 5–15)
BUN: 13 mg/dL (ref 6–20)
CO2: 19 mmol/L — ABNORMAL LOW (ref 22–32)
Calcium: 9.6 mg/dL (ref 8.9–10.3)
Chloride: 107 mmol/L (ref 98–111)
Creatinine, Ser: 0.94 mg/dL (ref 0.44–1.00)
GFR, Estimated: >60 mL/min (ref >60)
Glucose, Bld: 90 mg/dL (ref 70–99)
Potassium: 3.3 mmol/L — ABNORMAL LOW (ref 3.5–5.1)
Sodium: 137 mmol/L (ref 135–145)

## 2022-03-22 LAB — PREGNANCY, URINE: Preg Test, Ur: NEGATIVE

## 2022-03-22 MED ORDER — HYDROMORPHONE HCL 1 MG/ML IJ SOLN
0.5000 mg | INTRAMUSCULAR | Status: AC
  Administered 2022-03-22: 0.5 mg via INTRAVENOUS
  Filled 2022-03-22: qty 0.5

## 2022-03-22 MED ORDER — ONDANSETRON HCL 4 MG/2ML IJ SOLN
4.0000 mg | Freq: Once | INTRAMUSCULAR | Status: AC
  Administered 2022-03-22: 4 mg via INTRAVENOUS
  Filled 2022-03-22: qty 2

## 2022-03-22 MED ORDER — FENTANYL CITRATE PF 50 MCG/ML IJ SOSY
50.0000 ug | PREFILLED_SYRINGE | INTRAMUSCULAR | Status: DC | PRN
  Administered 2022-03-22: 50 ug via INTRAVENOUS
  Filled 2022-03-22: qty 1

## 2022-03-22 MED ORDER — OXYCODONE HCL 5 MG PO TABS
5.0000 mg | ORAL_TABLET | Freq: Once | ORAL | Status: AC
  Administered 2022-03-22: 5 mg via ORAL
  Filled 2022-03-22: qty 1

## 2022-03-22 MED ORDER — HYDROCODONE-ACETAMINOPHEN 5-325 MG PO TABS: 1.0000 | ORAL_TABLET | Freq: Four times a day (QID) | ORAL | 0 refills | Status: AC | PRN

## 2022-03-22 MED ORDER — MORPHINE SULFATE (PF) 4 MG/ML IV SOLN
4.0000 mg | Freq: Once | INTRAVENOUS | Status: AC
  Administered 2022-03-22: 4 mg via INTRAVENOUS
  Filled 2022-03-22: qty 1

## 2022-03-22 NOTE — ED Provider Notes (Signed)
[facility] Provider Note    Event Date/Time   First MD Initiated Contact with Patient 03/22/22 1920     (approximate)   History   Flank Pain   HPI  Gabrielle Jackson is a 31 y.o. female with history of kidney stone, dysmenorrhea, anxiety reports to the emergency department for treatment and evaluation of right flank pain that radiates into the right lower quadrant.  Symptoms started yesterday with a slight pain in the flank and today progressed to sharp pain in the back that radiates into the right side of the abdomen.  Pain consistent with previous kidney stone.  She also reports history of gallstones without cholecystectomy.  She is feels nauseated but has not experienced vomiting.  No known fever.  No dysuria.  No relief with cranberry juice and over-the-counter meds.     Physical Exam   Triage Vital Signs: ED Triage Vitals  Enc Vitals Group     BP 03/22/22 1803 (!) 137/103     Pulse Rate 03/22/22 1803 (!) 123     Resp 03/22/22 1803 18     Temp 03/22/22 1803 98 F (36.7 C)     Temp Source 03/22/22 1803 Oral     SpO2 03/22/22 1803 100 %     Weight 03/22/22 1807 180 lb (81.6 kg)     Height 03/22/22 1807 '5\' 7"'$  (1.702 m)     Head Circumference --      Peak Flow --      Pain Score 03/22/22 1806 8     Pain Loc --      Pain Edu? --      Excl. in [place]? --     Most recent vital signs: Vitals:   03/22/22 1803 03/22/22 2108  BP: (!) 137/103 117/82  Pulse: (!) 123 (!) 105  Resp: 18 18  Temp: 98 F (36.7 C)   SpO2: 100% 99%     General: Awake, no distress.  CV:  Good peripheral perfusion.  Resp:  Normal effort.  Abd:  No distention.  Other:  No rebound tenderness of the right lower quadrant.  Tenderness to palpation over the right upper quadrant.  CVA tenderness of the right flank.   ED Results / Procedures / Treatments   Labs (all labs ordered are listed, but only abnormal results are displayed) Labs Reviewed  URINALYSIS, ROUTINE W  REFLEX MICROSCOPIC - Abnormal; Notable for the following components:      Result Value   Color, Urine YELLOW (*)    APPearance CLEAR (*)    Hgb urine dipstick TRACE (*)    Ketones, ur 40 (*)    All other components within normal limits  BASIC METABOLIC PANEL - Abnormal; Notable for the following components:   Potassium 3.3 (*)    CO2 19 (*)    All other components within normal limits  CBC - Abnormal; Notable for the following components:   WBC 12.4 (*)    All other components within normal limits  URINE CULTURE  PREGNANCY, URINE     EKG  Not indicated   RADIOLOGY CT renal stone study reviewed and interpreted by me: negative for hydronephrosis or obvious obstruction. Radiology report of the CT renal stone study negative for acute concerns.  Ultrasound of the right upper quadrant reviewed and interpreted by me: No obvious gallstones or wall thickening. Radiology review report of the ultrasound of the right upper quadrant negative for acute findings.  PROCEDURES:  Critical Care performed: No  Procedures   MEDICATIONS ORDERED IN ED: Medications  fentaNYL (SUBLIMAZE) injection 50 mcg (50 mcg Intravenous Given 03/22/22 1816)  ondansetron (ZOFRAN) injection 4 mg (4 mg Intravenous Given 03/22/22 1816)  morphine (PF) 4 MG/ML injection 4 mg (4 mg Intravenous Given 03/22/22 1939)  HYDROmorphone (DILAUDID) injection 0.5 mg (0.5 mg Intravenous Given 03/22/22 2106)  ondansetron (ZOFRAN) injection 4 mg (4 mg Intravenous Given 03/22/22 2105)  oxyCODONE (Oxy IR/ROXICODONE) immediate release tablet 5 mg (5 mg Oral Given 03/22/22 2342)     IMPRESSION / MDM / ASSESSMENT AND PLAN / ED COURSE  I reviewed the triage vital signs and the nursing notes.                              Differential diagnosis includes, but is not limited to, kidney stone, pyelonephritis, gallstones, acute cholecystitis, ovarian cyst  Patient's presentation is most consistent with acute illness / injury with  system symptoms.  31 year old female presenting to the emergency department for treatment and evaluation of right flank pain.  See HPI for further details.  While here, CT for renal stone study performed and negative.  Ultrasound of the right upper quadrant also performed and is negative for acute concerns.  Lab studies are overall reassuring.  Urinalysis shows trace amount of hemoglobin but is otherwise reassuring.  Pain now controlled.  Pain likely secondary to recently passed kidney stone since the pain was similar and there is a trace amount of hemoglobin on the urinalysis.  She was offered observation admission and pain control however chooses to be discharged home with medication in case the pain returns and intends to follow-up with primary care.  ER return precautions discussed.     FINAL CLINICAL IMPRESSION(S) / ED DIAGNOSES   Final diagnoses:  Right flank pain  Right-sided abdominal pain of unknown cause     Rx / DC Orders   ED Discharge Orders          Ordered    HYDROcodone-acetaminophen (NORCO/VICODIN) 5-325 MG tablet  Every 6 hours PRN        03/22/22 2240             Note:  This document was prepared using Dragon voice recognition software and may include unintentional dictation errors.   Victorino Dike, FNP 03/23/22 0009    Harvest Dark, MD 03/24/22 [phone]

## 2022-03-22 NOTE — ED Notes (Signed)
Pt back from imaging

## 2022-03-22 NOTE — ED Notes (Signed)
Pt in with extreme R flank pain; pt curled in ball on stretcher leaning against L stretcher rail when this RN entered room, pt taking deep breaths, pt reports R abd, flank and back pain. Reports tender at back. Denies changes to her urination currently. Provider notified in person that pt reports pain unchanged since fentanyl dose given.

## 2022-03-22 NOTE — ED Triage Notes (Signed)
Via POV sent from [facility] reporting severe right flank pain since this morning, rapidly intensifying through the afternoon. Hx prior kidney stones and states this feels the same. Pt is acutely uncomfortable in triage.

## 2022-03-22 NOTE — ED Provider Notes (Incomplete)
[facility] Provider Note    Event Date/Time   First MD Initiated Contact with Patient 03/22/22 1920     (approximate)   History   Flank Pain   HPI  PERSIA LINTNER is a 31 y.o. female with history of kidney stone, dysmenorrhea, anxiety reports to the emergency department for treatment and evaluation of right flank pain that radiates into the right lower quadrant.  Symptoms started yesterday with a slight pain in the flank and today progressed to sharp pain in the back that radiates into the right side of the abdomen.  Pain consistent with previous kidney stone.  She also reports history of gallstones without cholecystectomy.  She is feels nauseated but has not experienced vomiting.  No known fever.  No dysuria.  No relief with cranberry juice and over-the-counter meds.     Physical Exam   Triage Vital Signs: ED Triage Vitals  Enc Vitals Group     BP 03/22/22 1803 (!) 137/103     Pulse Rate 03/22/22 1803 (!) 123     Resp 03/22/22 1803 18     Temp 03/22/22 1803 98 F (36.7 C)     Temp Source 03/22/22 1803 Oral     SpO2 03/22/22 1803 100 %     Weight 03/22/22 1807 180 lb (81.6 kg)     Height 03/22/22 1807 '5\' 7"'$  (1.702 m)     Head Circumference --      Peak Flow --      Pain Score 03/22/22 1806 8     Pain Loc --      Pain Edu? --      Excl. in [place]? --     Most recent vital signs: Vitals:   03/22/22 1803  BP: (!) 137/103  Pulse: (!) 123  Resp: 18  Temp: 98 F (36.7 C)  SpO2: 100%     General: Awake, no distress.  CV:  Good peripheral perfusion.  Resp:  Normal effort.  Abd:  No distention.  Other:  No rebound tenderness of the right lower quadrant.  Tenderness to palpation over the right upper quadrant.  CVA tenderness of the right flank.   ED Results / Procedures / Treatments   Labs (all labs ordered are listed, but only abnormal results are displayed) Labs Reviewed  URINALYSIS, ROUTINE W REFLEX MICROSCOPIC - Abnormal; Notable  for the following components:      Result Value   Color, Urine YELLOW (*)    APPearance CLEAR (*)    Hgb urine dipstick TRACE (*)    Ketones, ur 40 (*)    All other components within normal limits  BASIC METABOLIC PANEL - Abnormal; Notable for the following components:   Potassium 3.3 (*)    CO2 19 (*)    All other components within normal limits  CBC - Abnormal; Notable for the following components:   WBC 12.4 (*)    All other components within normal limits  URINE CULTURE  PREGNANCY, URINE     EKG  Not indicated   RADIOLOGY CT renal stone study reviewed and interpreted by me: negative for hydronephrosis or obvious obstruction. Radiology report of the CT renal stone study negative for acute concerns.  Ultrasound of the right upper quadrant reviewed and interpreted by me: No obvious gallstones or wall thickening. Radiology review report of the ultrasound of the right upper quadrant negative for acute findings.  PROCEDURES:  Critical Care performed: No  Procedures   MEDICATIONS ORDERED IN ED:  Medications  fentaNYL (SUBLIMAZE) injection 50 mcg (50 mcg Intravenous Given 03/22/22 1816)  ondansetron (ZOFRAN) injection 4 mg (4 mg Intravenous Given 03/22/22 1816)     IMPRESSION / MDM / ASSESSMENT AND PLAN / ED COURSE  I reviewed the triage vital signs and the nursing notes.                              Differential diagnosis includes, but is not limited to, kidney stone, pyelonephritis, gallstones, acute cholecystitis, ovarian cyst  Patient's presentation is most consistent with acute illness / injury with system symptoms.  31 year old female presenting to the emergency department for treatment and evaluation of right flank pain.  See HPI for further details.  While here, CT for renal stone study performed and negative.  Ultrasound of the right upper quadrant also performed and is negative for acute concerns.     FINAL CLINICAL IMPRESSION(S) / ED DIAGNOSES   Final  diagnoses:  None     Rx / DC Orders   ED Discharge Orders     None        Note:  This document was prepared using Dragon voice recognition software and may include unintentional dictation errors.

## 2022-03-22 NOTE — ED Notes (Signed)
First Nurse Note: Patient sent to ED from [facility] for right sided flank pain. Pain 8/10. Hx of kidney stones.

## 2022-03-23 LAB — URINE CULTURE: Culture: NO GROWTH

## 2022-03-23 NOTE — ED Notes (Signed)
E signature pad not working. Pt educated on discharge instructions and verbalized understanding.  

## 2022-05-08 ENCOUNTER — Ambulatory Visit: Payer: Commercial Managed Care - PPO | Admitting: Obstetrics and Gynecology

## 2022-05-08 ENCOUNTER — Other Ambulatory Visit: Payer: Self-pay | Admitting: Family Medicine

## 2022-05-08 DIAGNOSIS — F324 Major depressive disorder, single episode, in partial remission: Secondary | ICD-10-CM

## 2022-05-08 DIAGNOSIS — Z01419 Encounter for gynecological examination (general) (routine) without abnormal findings: Secondary | ICD-10-CM

## 2022-05-08 DIAGNOSIS — F411 Generalized anxiety disorder: Secondary | ICD-10-CM

## 2022-05-08 DIAGNOSIS — F4010 Social phobia, unspecified: Secondary | ICD-10-CM

## 2022-05-08 NOTE — Telephone Encounter (Signed)
Requested medication (s) are due for refill today:   No  Requested medication (s) are on the active medication list:   No  Future visit scheduled:   No   Last ordered: Discontinued 10/31/2021  Returned because it came up for refill but it's been discontinued.      Requested Prescriptions  Pending Prescriptions Disp Refills   venlafaxine XR (EFFEXOR-XR) 75 MG 24 hr capsule [Pharmacy Med Name: VENLAFAXINE ER '75MG'$  CAPSULES] 90 capsule 1    Sig: TAKE 1 CAPSULE BY MOUTH EVERY DAY WITH BREAKFAST     Psychiatry: Antidepressants - SNRI - desvenlafaxine & venlafaxine Failed - 05/08/2022  3:29 AM      Failed - Valid encounter within last 6 months    Recent Outpatient Visits           9 months ago RUQ abdominal pain   [place], Megan P, DO   9 months ago RUQ abdominal pain   [facility] Jon Billings, NP   11 months ago Adverse effect of drug, initial encounter   [place], Lauren A, NP   12 months ago GAD (generalized anxiety disorder)   [facility] [place], Megan P, DO   1 year ago Encounter for initial prescription of vaginal ring hormonal contraceptive   [facility] [place], Megan P, DO              Failed - Lipid Panel in normal range within the last 12 months    Cholesterol, Total  Date Value Ref Range Status  03/22/2020 106 100 - 199 mg/dL Final   LDL Chol Calc (NIH)  Date Value Ref Range Status  03/22/2020 40 0 - 99 mg/dL Final   HDL  Date Value Ref Range Status  03/22/2020 50 >39 mg/dL Final   Triglycerides  Date Value Ref Range Status  03/22/2020 77 0 - 149 mg/dL Final         Passed - Cr in normal range and within 360 days    Creatinine  Date Value Ref Range Status  10/06/2013 0.66 0.60 - 1.30 mg/dL Final   Creatinine, Ser  Date Value Ref Range Status  03/22/2022 0.94 0.44 - 1.00 mg/dL Final         Passed - Completed PHQ-2 or PHQ-9 in the last 360 days       Passed - Last BP in normal range    BP Readings from Last 1 Encounters:  03/23/22 121/80

## 2022-07-03 ENCOUNTER — Telehealth: Payer: Self-pay | Admitting: Family Medicine

## 2022-07-03 ENCOUNTER — Ambulatory Visit
Admission: EM | Admit: 2022-07-03 | Discharge: 2022-07-03 | Discharge status: Against Medical Advice | Payer: Commercial Managed Care - PPO

## 2022-07-03 DIAGNOSIS — H65193 Other acute nonsuppurative otitis media, bilateral: Secondary | ICD-10-CM

## 2022-07-03 MED ORDER — NEOMYCIN-POLYMYXIN-HC 3.5-10000-1 OT SOLN: 3.0000 [drp] | Freq: Four times a day (QID) | OTIC | 0 refills | Status: DC

## 2022-07-03 NOTE — Progress Notes (Signed)
Virtual Visit Consent   Gabrielle Jackson, you are scheduled for a virtual visit with a [facility] provider today. Just as with appointments in the office, your consent must be obtained to participate. Your consent will be active for this visit and any virtual visit you may have with one of our providers in the next 365 days. If you have a MyChart account, a copy of this consent can be sent to you electronically.  As this is a virtual visit, video technology does not allow for your provider to perform a traditional examination. This may limit your provider's ability to fully assess your condition. If your provider identifies any concerns that need to be evaluated in person or the need to arrange testing (such as labs, EKG, etc.), we will make arrangements to do so. Although advances in technology are sophisticated, we cannot ensure that it will always work on either your end or our end. If the connection with a video visit is poor, the visit may have to be switched to a telephone visit. With either a video or telephone visit, we are not always able to ensure that we have a secure connection.  By engaging in this virtual visit, you consent to the provision of healthcare and authorize for your insurance to be billed (if applicable) for the services provided during this visit. Depending on your insurance coverage, you may receive a charge related to this service.  I need to obtain your verbal consent now. Are you willing to proceed with your visit today? Gabrielle Jackson has provided verbal consent on 07/03/2022 for a virtual visit (video or telephone). [name], NP  Date: 07/03/2022 3:17 PM  Virtual Visit via Video Note   I, [name], connected with  Gabrielle Jackson  ([id number], 02-11-1991) on 07/03/22 at  3:15 PM EST by a video-enabled telemedicine application and verified that I am speaking with the correct person using two identifiers.  Location: Patient: Virtual Visit Location  Patient: Home Provider: Virtual Visit Location Provider: Home Office   I discussed the limitations of evaluation and management by telemedicine and the availability of in person appointments. The patient expressed understanding and agreed to proceed.    History of Present Illness: Gabrielle Jackson is a 32 y.o. who identifies as a female who was assigned female at birth, and is being seen today for bilateral ear pain with some sinus congestion, going on 3 days. Pain is mostly behind the ear and down the back of throat. Denies fevers, chills, chest pain, shortness of breath.  No known sick contacts  Took some advil for pain- does not help much. No recent illness or URI prior to symptoms.   Problems:  Patient Active Problem List   Diagnosis Date Noted   Cellulitis of finger of right hand 02/16/2020   MDD (major depressive disorder), single episode, in partial remission ([place]) 05/12/2019   Social anxiety disorder 04/01/2019   Bereavement 03/13/2019   Depressive disorder 02/13/2019   GAD (generalized anxiety disorder) 09/05/2015   Partial hamstring tear 05/02/2015    Allergies:  Allergies  Allergen Reactions   Penicillins Rash   Medications:  Current Outpatient Medications:    amphetamine-dextroamphetamine (ADDERALL) 20 MG tablet, Take 20 mg by mouth 2 (two) times daily., Disp: , Rfl:    venlafaxine XR (EFFEXOR-XR) 37.5 MG 24 hr capsule, Take 37.5 mg by mouth daily., Disp: , Rfl:   Observations/Objective: Patient is well-developed, well-nourished in no acute distress.  Resting comfortably  at home.  Head is normocephalic, atraumatic.  No labored breathing.  Speech is clear and coherent with logical content.  Patient is alert and oriented at baseline.    Assessment and Plan:  1. Other non-recurrent acute nonsuppurative otitis media of both ears  - neomycin-polymyxin-hydrocortisone (CORTISPORIN) OTIC solution; Place 3 drops into both ears 4 (four) times daily. X 7 days   Dispense: 10 mL; Refill: 0  -just starting with ear pain- most likely viral related- but given history with frequency of ear infections will start drops. -encouraged flonase use as well    Reviewed side effects, risks and benefits of medication.    Patient acknowledged agreement and understanding of the plan.   Past Medical, Surgical, Social History, Allergies, and Medications have been Reviewed.   Follow Up Instructions: I discussed the assessment and treatment plan with the patient. The patient was provided an opportunity to ask questions and all were answered. The patient agreed with the plan and demonstrated an understanding of the instructions.  A copy of instructions were sent to the patient via MyChart unless otherwise noted below.     The patient was advised to call back or seek an in-person evaluation if the symptoms worsen or if the condition fails to improve as anticipated.  Time:  I spent 8 minutes with the patient via telehealth technology discussing the above problems/concerns.    [name], NP

## 2022-07-03 NOTE — Patient Instructions (Signed)
  Gabrielle Jackson, thank you for joining [name], NP for today's virtual visit.  While this provider is not your primary care provider (PCP), if your PCP is located in our provider database this encounter information will be shared with them immediately following your visit.   [place] account gives you access to today's visit and all your visits, tests, and labs performed at [facility] " click here if you don't have a [facility] account or go to [email or web address]  Consent: (Patient) Gabrielle Jackson provided verbal consent for this virtual visit at the beginning of the encounter.  Current Medications:  Current Outpatient Medications:    neomycin-polymyxin-hydrocortisone (CORTISPORIN) OTIC solution, Place 3 drops into both ears 4 (four) times daily. X 7 days, Disp: 10 mL, Rfl: 0   amphetamine-dextroamphetamine (ADDERALL) 20 MG tablet, Take 20 mg by mouth 2 (two) times daily., Disp: , Rfl:    venlafaxine XR (EFFEXOR-XR) 37.5 MG 24 hr capsule, Take 37.5 mg by mouth daily., Disp: , Rfl:    Medications ordered in this encounter:  Meds ordered this encounter  Medications   neomycin-polymyxin-hydrocortisone (CORTISPORIN) OTIC solution    Sig: Place 3 drops into both ears 4 (four) times daily. X 7 days    Dispense:  10 mL    Refill:  0    Order Specific Question:   Supervising Provider    Answer:   Chase Picket [[id number]]     *If you need refills on other medications prior to your next appointment, please contact your pharmacy*  Follow-Up: Call back or seek an in-person evaluation if the symptoms worsen or if the condition fails to improve as anticipated.  [place] [phone]  Other Instructions   -use Cortisporin drops - Continue allergy medications.  - Steam and humidifier can help - Stay well hydrated and get plenty of rest.   - Seek in person evaluation if no symptom improvement or if symptoms  worsen      If you have been instructed to have an in-person evaluation today at a local Urgent Care facility, please use the link below. It will take you to a list of all of our available [facility], including address, phone number and hours of operation. Please do not delay care.  [facility]  If you or a family member do not have a primary care provider, use the link below to schedule a visit and establish care. When you choose a [facility] primary care physician or advanced practice provider, you gain a long-term partner in health. Find a Primary Care Provider  Learn more about [facility]'s in-office and virtual care options: [facility]

## 2022-07-14 ENCOUNTER — Telehealth: Payer: Self-pay

## 2022-07-14 ENCOUNTER — Encounter: Payer: Self-pay | Admitting: Physician Assistant

## 2022-07-14 ENCOUNTER — Telehealth: Payer: Commercial Managed Care - PPO | Admitting: Physician Assistant

## 2022-07-14 DIAGNOSIS — H9209 Otalgia, unspecified ear: Secondary | ICD-10-CM

## 2022-07-14 MED ORDER — AZITHROMYCIN 250 MG PO TABS: ORAL_TABLET | ORAL | 0 refills | Status: AC

## 2022-07-14 MED ORDER — DICLOFENAC SODIUM 75 MG PO TBEC: 75.0000 mg | DELAYED_RELEASE_TABLET | Freq: Two times a day (BID) | ORAL | 0 refills | Status: DC

## 2022-07-14 NOTE — Progress Notes (Signed)
Virtual Visit Consent   [name], you are scheduled for a virtual visit with a [facility] provider today. Just as with appointments in the office, your consent must be obtained to participate. Your consent will be active for this visit and any virtual visit you may have with one of our providers in the next 365 days. If you have a MyChart account, a copy of this consent can be sent to you electronically.  As this is a virtual visit, video technology does not allow for your provider to perform a traditional examination. This may limit your provider's ability to fully assess your condition. If your provider identifies any concerns that need to be evaluated in person or the need to arrange testing (such as labs, EKG, etc.), we will make arrangements to do so. Although advances in technology are sophisticated, we cannot ensure that it will always work on either your end or our end. If the connection with a video visit is poor, the visit may have to be switched to a telephone visit. With either a video or telephone visit, we are not always able to ensure that we have a secure connection.  By engaging in this virtual visit, you consent to the provision of healthcare and authorize for your insurance to be billed (if applicable) for the services provided during this visit. Depending on your insurance coverage, you may receive a charge related to this service.  I need to obtain your verbal consent now. Are you willing to proceed with your visit today? Gabrielle Jackson has provided verbal consent on 07/14/2022 for a virtual visit (video or telephone). Inda Coke, [place]  Date: 07/14/2022 5:09 PM  Virtual Visit via Video Note   I, Inda Coke, connected with  Gabrielle Jackson  ([id number], [birth year]) on 07/14/22 at  6:30 PM EST by a video-enabled telemedicine application and verified that I am speaking with the correct person using two identifiers.  Location: Patient: Virtual Visit  Location Patient: Home Provider: Virtual Visit Location Provider: Home Office   I discussed the limitations of evaluation and management by telemedicine and the availability of in person appointments. The patient expressed understanding and agreed to proceed.    History of Present Illness: Gabrielle Jackson is a 32 y.o. who identifies as a female who was assigned female at birth, and is being seen today for ear pain.  She was seen 07/03/22 virtually with [place] and was given neomycin-polymyxin-HC drops to use in both ears. She has used this as well as OTC advil around the clock without relief of symptoms.   She has a broken tooth on the right side that she has a dentist appointment for in 6 days.  She has been also using warm compress without relief. Denies recent sore throat, but she has had some sinus congestion as well.  Denies: fevers, chills, chest pain, SOB, inability to swallow, neck stiffness  HPI: HPI  Problems:  Patient Active Problem List   Diagnosis Date Noted   Cellulitis of finger of right hand 02/16/2020   MDD (major depressive disorder), single episode, in partial remission ([place]) 05/12/2019   Social anxiety disorder 04/01/2019   Bereavement 03/13/2019   Depressive disorder 02/13/2019   GAD (generalized anxiety disorder) 09/05/2015   Partial hamstring tear 05/02/2015    Allergies:  Allergies  Allergen Reactions   Penicillins Rash   Medications:  Current Outpatient Medications:    azithromycin (ZITHROMAX) 250 MG tablet, Take 2 tablets on day 1, then 1  tablet daily on days 2 through 5, Disp: 6 tablet, Rfl: 0   diclofenac (VOLTAREN) 75 MG EC tablet, Take 1 tablet (75 mg total) by mouth 2 (two) times daily., Disp: 30 tablet, Rfl: 0   amphetamine-dextroamphetamine (ADDERALL) 20 MG tablet, Take 20 mg by mouth 2 (two) times daily., Disp: , Rfl:    venlafaxine XR (EFFEXOR-XR) 37.5 MG 24 hr capsule, Take 37.5 mg by mouth daily., Disp: , Rfl:    Observations/Objective: Patient is well-developed, well-nourished in no acute distress.  Resting comfortably  at home.  Head is normocephalic, atraumatic.  No labored breathing.  Speech is clear and coherent with logical content.  Patient is alert and oriented at baseline.   Assessment and Plan: 1. Otalgia, unspecified laterality No red flags Will treat for AOM/sinusitis with azithromcyin (PCN allergic) I have also sent in oral diclofenac for her to take to replace her advil Recommend OTC pepcid or nexium to protect stomach given prolonged use of NSAIDs If any new/worsening sx, needs IN-PERSON eval  Follow Up Instructions: I discussed the assessment and treatment plan with the patient. The patient was provided an opportunity to ask questions and all were answered. The patient agreed with the plan and demonstrated an understanding of the instructions.  A copy of instructions were sent to the patient via MyChart unless otherwise noted below.    The patient was advised to call back or seek an in-person evaluation if the symptoms worsen or if the condition fails to improve as anticipated.  Time:  I spent 5-10 minutes with the patient via telehealth technology discussing the above problems/concerns.    Inda Coke, [place]

## 2022-07-14 NOTE — Patient Instructions (Signed)
It was great to see you!  Start azithromycin antibiotic  Start oral prescription diclofenac for pain relief  Do not take any additional ibuprofen/motrin/aleve while taking this  I recommend starting an over the counter antacid such as pepcid or nexium to protect you stomach while taking anti-inflammatories for this duration  If any new/worsening symptoms, please plan to be seen in-person for further evaluation  Take care,  Inda Coke PA-C

## 2022-12-24 ENCOUNTER — Other Ambulatory Visit: Payer: Self-pay

## 2022-12-24 ENCOUNTER — Encounter: Payer: Self-pay | Admitting: Emergency Medicine

## 2022-12-24 ENCOUNTER — Emergency Department: Payer: BC Managed Care – PPO

## 2022-12-24 DIAGNOSIS — R002 Palpitations: Secondary | ICD-10-CM | POA: Diagnosis not present

## 2022-12-24 DIAGNOSIS — R202 Paresthesia of skin: Secondary | ICD-10-CM | POA: Insufficient documentation

## 2022-12-24 DIAGNOSIS — R55 Syncope and collapse: Secondary | ICD-10-CM | POA: Diagnosis not present

## 2022-12-24 DIAGNOSIS — E876 Hypokalemia: Secondary | ICD-10-CM | POA: Insufficient documentation

## 2022-12-24 LAB — URINALYSIS, ROUTINE W REFLEX MICROSCOPIC
Bacteria, UA: NONE SEEN
Bilirubin Urine: NEGATIVE
Glucose, UA: NEGATIVE mg/dL
Ketones, ur: NEGATIVE mg/dL
Leukocytes,Ua: NEGATIVE
Nitrite: NEGATIVE
Protein, ur: NEGATIVE mg/dL
Specific Gravity, Urine: 1.005 (ref 1.005–1.030)
pH: 6 (ref 5.0–8.0)

## 2022-12-24 LAB — COMPREHENSIVE METABOLIC PANEL
ALT: 24 U/L (ref 0–44)
AST: 25 U/L (ref 15–41)
Albumin: 4.3 g/dL (ref 3.5–5.0)
Alkaline Phosphatase: 86 U/L (ref 38–126)
Anion gap: 10 (ref 5–15)
BUN: 14 mg/dL (ref 6–20)
CO2: 21 mmol/L — ABNORMAL LOW (ref 22–32)
Calcium: 9.3 mg/dL (ref 8.9–10.3)
Chloride: 108 mmol/L (ref 98–111)
Creatinine, Ser: 0.96 mg/dL (ref 0.44–1.00)
GFR, Estimated: >60 mL/min (ref >60)
Glucose, Bld: 106 mg/dL — ABNORMAL HIGH (ref 70–99)
Potassium: 2.8 mmol/L — ABNORMAL LOW (ref 3.5–5.1)
Sodium: 139 mmol/L (ref 135–145)
Total Bilirubin: 0.9 mg/dL (ref 0.3–1.2)
Total Protein: 7.7 g/dL (ref 6.5–8.1)

## 2022-12-24 LAB — CBC WITH DIFFERENTIAL/PLATELET
Abs Immature Granulocytes: 0.03 10*3/uL (ref 0.00–0.07)
Basophils Absolute: 0.1 10*3/uL (ref 0.0–0.1)
Basophils Relative: 1 %
Eosinophils Absolute: 0.2 10*3/uL (ref 0.0–0.5)
Eosinophils Relative: 1 %
HCT: 39.3 % (ref 36.0–46.0)
Hemoglobin: 13.4 g/dL (ref 12.0–15.0)
Immature Granulocytes: 0 %
Lymphocytes Relative: 24 %
Lymphs Abs: 2.8 10*3/uL (ref 0.7–4.0)
MCH: 31 pg (ref 26.0–34.0)
MCHC: 34.1 g/dL (ref 30.0–36.0)
MCV: 91 fL (ref 80.0–100.0)
Monocytes Absolute: 1 10*3/uL (ref 0.1–1.0)
Monocytes Relative: 8 %
Neutro Abs: 7.5 10*3/uL (ref 1.7–7.7)
Neutrophils Relative %: 66 %
Platelets: 385 10*3/uL (ref 150–400)
RBC: 4.32 MIL/uL (ref 3.87–5.11)
RDW: 12.1 % (ref 11.5–15.5)
WBC: 11.5 10*3/uL — ABNORMAL HIGH (ref 4.0–10.5)
nRBC: 0 % (ref 0.0–0.2)

## 2022-12-24 LAB — TROPONIN I (HIGH SENSITIVITY): Troponin I (High Sensitivity): 4 ng/L (ref <18)

## 2022-12-24 LAB — POC URINE PREG, ED: Preg Test, Ur: NEGATIVE

## 2022-12-24 NOTE — ED Triage Notes (Signed)
Pt presents ambulatory to triage via POV with complaints of dizziness, generalized body numbness/tingling, with associated chest heaviness that started around ~ 1 hour ago. Pt notes bathing her dogs when the sx began. No falls or injury.  A&Ox4 at this time. Denies CP or SOB.

## 2022-12-25 ENCOUNTER — Other Ambulatory Visit: Payer: Self-pay

## 2022-12-25 ENCOUNTER — Emergency Department
Admission: EM | Admit: 2022-12-25 | Discharge: 2022-12-25 | Disposition: A | Discharge status: Home / Self Care | Payer: BC Managed Care – PPO | Attending: Emergency Medicine | Admitting: Emergency Medicine

## 2022-12-25 DIAGNOSIS — E876 Hypokalemia: Secondary | ICD-10-CM

## 2022-12-25 DIAGNOSIS — R55 Syncope and collapse: Secondary | ICD-10-CM

## 2022-12-25 DIAGNOSIS — R202 Paresthesia of skin: Secondary | ICD-10-CM

## 2022-12-25 DIAGNOSIS — R002 Palpitations: Secondary | ICD-10-CM

## 2022-12-25 MED ORDER — POTASSIUM CHLORIDE ER 10 MEQ PO TBCR: 20.0000 meq | EXTENDED_RELEASE_TABLET | Freq: Every day | ORAL | 0 refills | Status: DC

## 2022-12-25 MED ORDER — POTASSIUM CHLORIDE CRYS ER 20 MEQ PO TBCR
40.0000 meq | EXTENDED_RELEASE_TABLET | Freq: Once | ORAL | Status: AC
  Administered 2022-12-25: 40 meq via ORAL
  Filled 2022-12-25: qty 2

## 2022-12-25 NOTE — ED Provider Notes (Signed)
[facility] Provider Note    Event Date/Time   First MD Initiated Contact with Patient 12/25/22 [phone]     (approximate)   History   Palpitations and Numbness   HPI  Gabrielle Jackson is a 32 y.o. female history of generalized anxiety disorder, ADHD  Patient was at home today in her normal health.  She lifted her 70 pound dog up to the bathtub to give her a wash.  Immediately after doing so she felt lightheaded.  Shortly thereafter she started to notice it felt like she was tingling in her hands and feet and her mouth felt aching and cramping in her calves.  Symptoms all went away when she got to the waiting area.  She is very pleasant.  She reports that is never happened before.  She felt briefly like she was going to pass out after lifting the      Physical Exam   Triage Vital Signs: ED Triage Vitals  Enc Vitals Group     BP 12/24/22 2236 (!) 133/94     Pulse Rate 12/24/22 2236 97     Resp 12/24/22 2236 (!) 22     Temp 12/24/22 2236 98.1 F (36.7 C)     Temp Source 12/24/22 2236 Oral     SpO2 12/24/22 2236 100 %     Weight 12/24/22 2231 180 lb 12.4 oz (82 kg)     Height 12/24/22 2231 5\' 7"  (1.702 m)     Head Circumference --      Peak Flow --      Pain Score 12/24/22 2231 0     Pain Loc --      Pain Edu? --      Excl. in GC? --     Most recent vital signs: Vitals:   12/25/22 0430 12/25/22 0445  BP: 132/87   Pulse: 72 77  Resp: (!) 22 17  Temp:    SpO2: 100% 100%     General: Awake, no distress.  CV:  Good peripheral perfusion.  Normal tones and rate Resp:  Normal effort.  There are lungs bilaterally Abd:  No distention.  Soft nontender nondistended Other:  No lower extremity edema venous cords or congestion   ED Results / Procedures / Treatments   Labs (all labs ordered are listed, but only abnormal results are displayed) Labs Reviewed  URINALYSIS, ROUTINE W REFLEX MICROSCOPIC - Abnormal; Notable for the following  components:      Result Value   Color, Urine STRAW (*)    APPearance CLEAR (*)    Hgb urine dipstick SMALL (*)    All other components within normal limits  COMPREHENSIVE METABOLIC PANEL - Abnormal; Notable for the following components:   Potassium 2.8 (*)    CO2 21 (*)    Glucose, Bld 106 (*)    All other components within normal limits  CBC WITH DIFFERENTIAL/PLATELET - Abnormal; Notable for the following components:   WBC 11.5 (*)    All other components within normal limits  POC URINE PREG, ED  TROPONIN I (HIGH SENSITIVITY)  TROPONIN I (HIGH SENSITIVITY)     EKG  ED ECG REPORT I, Sharyn Creamer, the attending physician, personally viewed and interpreted this ECG.  Date: 12/25/2022 EKG Time: 3:30 AM Rate: 80 Rhythm: normal sinus rhythm QRS Axis: normal Intervals: normal ST/T Wave abnormalities: normal Narrative Interpretation: no evidence of acute ischemia  No prolongation of the QT interval.  RADIOLOGY  Chest x-ray interpreted by  me as normal   PROCEDURES:  Critical Care performed: No  Procedures   MEDICATIONS ORDERED IN ED: Medications  potassium chloride SA (KLOR-CON M) CR tablet 40 mEq (40 mEq Oral Given 12/25/22 0352)     IMPRESSION / MDM / ASSESSMENT AND PLAN / ED COURSE  I reviewed the triage vital signs and the nursing notes.                              Differential diagnosis includes, but is not limited to, vasovagal episode, paresthesia, hyperventilation especially given the fact that she reported paresthesias that followed shortly after the episode of now resolved, electrolyte imbalance, hypokalemia as noted in her labs, etc.  She does not have any ongoing symptoms she is awake alert hemodynamics are normal.  Very reassuring ECG and clinical history.  No symptoms that would be suggestive of ACS, dissection, pneumothorax, PE, acute blood loss anemia, neurologic emergency etc.  She does have hypokalemia but her EKG shows no impact of this.  Will  replete orally and discussed with patient recommended prescription short-term potassium and she can follow-up in about 1 week with her primary care.  She is alert well-oriented pleasant without distress very reassuring clinical exam at this time labs also reassuring with exception of hypokalemia which is repleted  Patient's presentation is most consistent with acute complicated illness / injury requiring diagnostic workup.   Return precautions and treatment recommendations and follow-up discussed with the patient who is agreeable with the plan.        FINAL CLINICAL IMPRESSION(S) / ED DIAGNOSES   Final diagnoses:  Palpitations  Hypokalemia  Paresthesia  Near syncope     Rx / DC Orders   ED Discharge Orders          Ordered    potassium chloride (KLOR-CON) 10 MEQ tablet  Daily        12/25/22 0343             Note:  This document was prepared using Dragon voice recognition software and may include unintentional dictation errors.   Sharyn Creamer, MD 12/25/22 [phone]

## 2023-01-05 ENCOUNTER — Telehealth: Payer: BC Managed Care – PPO | Admitting: Family Medicine

## 2023-01-05 DIAGNOSIS — J019 Acute sinusitis, unspecified: Secondary | ICD-10-CM | POA: Diagnosis not present

## 2023-01-05 MED ORDER — AZITHROMYCIN 250 MG PO TABS: ORAL_TABLET | ORAL | 0 refills | Status: AC

## 2023-01-05 NOTE — Progress Notes (Signed)
Virtual Visit Consent   Gabrielle Jackson, you are scheduled for a virtual visit with a [facility] provider today. Just as with appointments in the office, your consent must be obtained to participate. Your consent will be active for this visit and any virtual visit you may have with one of our providers in the next 365 days. If you have a MyChart account, a copy of this consent can be sent to you electronically.  As this is a virtual visit, video technology does not allow for your provider to perform a traditional examination. This may limit your provider's ability to fully assess your condition. If your provider identifies any concerns that need to be evaluated in person or the need to arrange testing (such as labs, EKG, etc.), we will make arrangements to do so. Although advances in technology are sophisticated, we cannot ensure that it will always work on either your end or our end. If the connection with a video visit is poor, the visit may have to be switched to a telephone visit. With either a video or telephone visit, we are not always able to ensure that we have a secure connection.  By engaging in this virtual visit, you consent to the provision of healthcare and authorize for your insurance to be billed (if applicable) for the services provided during this visit. Depending on your insurance coverage, you may receive a charge related to this service.  I need to obtain your verbal consent now. Are you willing to proceed with your visit today? Gabrielle Jackson has provided verbal consent on 01/05/2023 for a virtual visit (video or telephone). Reed Pandy, [place]  Date: 01/05/2023 12:39 PM  Virtual Visit via Video Note   I, Reed Pandy, connected with  Gabrielle Jackson  ([id number], 10-11-1990) on 01/05/23 at 12:45 PM EDT by a video-enabled telemedicine application and verified that I am speaking with the correct person using two identifiers.  Location: Patient: Virtual Visit Location  Patient: Home Provider: Virtual Visit Location Provider: Home Office   I discussed the limitations of evaluation and management by telemedicine and the availability of in person appointments. The patient expressed understanding and agreed to proceed.    History of Present Illness: Gabrielle Jackson is a 32 y.o. who identifies as a female who was assigned female at birth, and is being seen today for c/o I think I have a sinus infection.  Pt c/o lots of mucus, facial pressure, and headache.  Pt denies fever.  Pt states symptoms on going for a week. Pt states taking nasal decongestant, aleve and sleeping. As well as flonase, but states it has not worked. Pt states has had sinus infections on the past.   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   Social anxiety disorder 04/01/2019   Bereavement 03/13/2019   Depressive disorder 02/13/2019   GAD (generalized anxiety disorder) 09/05/2015   Partial hamstring tear 05/02/2015    Allergies:  Allergies  Allergen Reactions   Penicillins Rash   Medications:  Current Outpatient Medications:    azithromycin (ZITHROMAX) 250 MG tablet, Take 2 tablets on day 1, then 1 tablet daily on days 2 through 5, Disp: 6 tablet, Rfl: 0   amphetamine-dextroamphetamine (ADDERALL) 20 MG tablet, Take 20 mg by mouth 2 (two) times daily., Disp: , Rfl:    diclofenac (  VOLTAREN) 75 MG EC tablet, Take 1 tablet (75 mg total) by mouth 2 (two) times daily., Disp: 30 tablet, Rfl: 0   potassium chloride (KLOR-CON) 10 MEQ tablet, Take 2 tablets (20 mEq total) by mouth daily., Disp: 6 tablet, Rfl: 0   venlafaxine XR (EFFEXOR-XR) 37.5 MG 24 hr capsule, Take 37.5 mg by mouth daily., Disp: , Rfl:   Observations/Objective: Patient is well-developed, well-nourished in no acute distress.  Resting comfortably at home.   Head is normocephalic, atraumatic.  No labored breathing.  Speech is clear and coherent with logical content.  Patient is alert and oriented at baseline.    Assessment and Plan: 1. Acute sinusitis, recurrence not specified, unspecified location - azithromycin (ZITHROMAX) 250 MG tablet; Take 2 tablets on day 1, then 1 tablet daily on days 2 through 5  Dispense: 6 tablet; Refill: 0  -Advised Pt if symptoms persist to F/U with PCP or urgent care -Pt verbalized understanding.   Follow Up Instructions: I discussed the assessment and treatment plan with the patient. The patient was provided an opportunity to ask questions and all were answered. The patient agreed with the plan and demonstrated an understanding of the instructions.  A copy of instructions were sent to the patient via MyChart unless otherwise noted below.     The patient was advised to call back or seek an in-person evaluation if the symptoms worsen or if the condition fails to improve as anticipated.  Time:  I spent 15 minutes with the patient via telehealth technology discussing the above problems/concerns.    Reed Pandy, PA-C

## 2023-01-05 NOTE — Patient Instructions (Signed)
Gabrielle Jackson, thank you for joining Reed Pandy, PA-C for today's virtual visit.  While this provider is not your primary care provider (PCP), if your PCP is located in our provider database this encounter information will be shared with them immediately following your visit.   A [facility] MyChart account gives you access to today's visit and all your visits, tests, and labs performed at [facility] " click here if you don't have a [facility] MyChart account or go to [email or web address]  Consent: (Patient) Gabrielle Jackson provided verbal consent for this virtual visit at the beginning of the encounter.  Current Medications:  Current Outpatient Medications:    azithromycin (ZITHROMAX) 250 MG tablet, Take 2 tablets on day 1, then 1 tablet daily on days 2 through 5, Disp: 6 tablet, Rfl: 0   amphetamine-dextroamphetamine (ADDERALL) 20 MG tablet, Take 20 mg by mouth 2 (two) times daily., Disp: , Rfl:    diclofenac (VOLTAREN) 75 MG EC tablet, Take 1 tablet (75 mg total) by mouth 2 (two) times daily., Disp: 30 tablet, Rfl: 0   potassium chloride (KLOR-CON) 10 MEQ tablet, Take 2 tablets (20 mEq total) by mouth daily., Disp: 6 tablet, Rfl: 0   venlafaxine XR (EFFEXOR-XR) 37.5 MG 24 hr capsule, Take 37.5 mg by mouth daily., Disp: , Rfl:    Medications ordered in this encounter:  Meds ordered this encounter  Medications   azithromycin (ZITHROMAX) 250 MG tablet    Sig: Take 2 tablets on day 1, then 1 tablet daily on days 2 through 5    Dispense:  6 tablet    Refill:  0     *If you need refills on other medications prior to your next appointment, please contact your pharmacy*  Follow-Up: Call back or seek an in-person evaluation if the symptoms worsen or if the condition fails to improve as anticipated.  [facility] [phone]  Other Instructions Sinus Infection, Adult A sinus infection, also called sinusitis, is inflammation of your sinuses.  Sinuses are hollow spaces in the bones around your face. Your sinuses are located: Around your eyes. In the middle of your forehead. Behind your nose. In your cheekbones. Mucus normally drains out of your sinuses. When your nasal tissues become inflamed or swollen, mucus can become trapped or blocked. This allows bacteria, viruses, and fungi to grow, which leads to infection. Most infections of the sinuses are caused by a virus. A sinus infection can develop quickly. It can last for up to 4 weeks (acute) or for more than 12 weeks (chronic). A sinus infection often develops after a cold. What are the causes? This condition is caused by anything that creates swelling in the sinuses or stops mucus from draining. This includes: Allergies. Asthma. Infection from bacteria or viruses. Deformities or blockages in your nose or sinuses. Abnormal growths in the nose (nasal polyps). Pollutants, such as chemicals or irritants in the air. Infection from fungi. This is rare. What increases the risk? You are more likely to develop this condition if you: Have a weak body defense system (immune system). Do a lot of swimming or diving. Overuse nasal sprays. Smoke. What are the signs or symptoms? The main symptoms of this condition are pain and a feeling of pressure around the affected sinuses. Other symptoms include: Stuffy nose or congestion that makes it difficult to breathe through your nose. Thick yellow or greenish drainage from your nose. Tenderness, swelling, and warmth over the affected sinuses. A cough that  may get worse at night. Decreased sense of smell and taste. Extra mucus that collects in the throat or the back of the nose (postnasal drip) causing a sore throat or bad breath. Tiredness (fatigue). Fever. How is this diagnosed? This condition is diagnosed based on: Your symptoms. Your medical history. A physical exam. Tests to find out if your condition is acute or chronic. This may  include: Checking your nose for nasal polyps. Viewing your sinuses using a device that has a light (endoscope). Testing for allergies or bacteria. Imaging tests, such as an MRI or CT scan. In rare cases, a bone biopsy may be done to rule out more serious types of fungal sinus disease. How is this treated? Treatment for a sinus infection depends on the cause and whether your condition is chronic or acute. If caused by a virus, your symptoms should go away on their own within 10 days. You may be given medicines to relieve symptoms. They include: Medicines that shrink swollen nasal passages (decongestants). A spray that eases inflammation of the nostrils (topical intranasal corticosteroids). Rinses that help get rid of thick mucus in your nose (nasal saline washes). Medicines that treat allergies (antihistamines). Over-the-counter pain relievers. If caused by bacteria, your health care provider may recommend waiting to see if your symptoms improve. Most bacterial infections will get better without antibiotic medicine. You may be given antibiotics if you have: A severe infection. A weak immune system. If caused by narrow nasal passages or nasal polyps, surgery may be needed. Follow these instructions at home: Medicines Take, use, or apply over-the-counter and prescription medicines only as told by your health care provider. These may include nasal sprays. If you were prescribed an antibiotic medicine, take it as told by your health care provider. Do not stop taking the antibiotic even if you start to feel better. Hydrate and humidify  Drink enough fluid to keep your urine pale yellow. Staying hydrated will help to thin your mucus. Use a cool mist humidifier to keep the humidity level in your home above 50%. Inhale steam for 10-15 minutes, 3-4 times a day, or as told by your health care provider. You can do this in the bathroom while a hot shower is running. Limit your exposure to cool or dry  air. Rest Rest as much as possible. Sleep with your head raised (elevated). Make sure you get enough sleep each night. General instructions  Apply a warm, moist washcloth to your face 3-4 times a day or as told by your health care provider. This will help with discomfort. Use nasal saline washes as often as told by your health care provider. Wash your hands often with soap and water to reduce your exposure to germs. If soap and water are not available, use hand sanitizer. Do not smoke. Avoid being around people who are smoking (secondhand smoke). Keep all follow-up visits. This is important. Contact a health care provider if: You have a fever. Your symptoms get worse. Your symptoms do not improve within 10 days. Get help right away if: You have a severe headache. You have persistent vomiting. You have severe pain or swelling around your face or eyes. You have vision problems. You develop confusion. Your neck is stiff. You have trouble breathing. These symptoms may be an emergency. Get help right away. Call 911. Do not wait to see if the symptoms will go away. Do not drive yourself to the hospital. Summary A sinus infection is soreness and inflammation of your sinuses. Sinuses are  hollow spaces in the bones around your face. This condition is caused by nasal tissues that become inflamed or swollen. The swelling traps or blocks the flow of mucus. This allows bacteria, viruses, and fungi to grow, which leads to infection. If you were prescribed an antibiotic medicine, take it as told by your health care provider. Do not stop taking the antibiotic even if you start to feel better. Keep all follow-up visits. This is important. This information is not intended to replace advice given to you by your health care provider. Make sure you discuss any questions you have with your health care provider. Document Revised: 05/23/2021 Document Reviewed: 05/23/2021 Elsevier Patient Education  2024  [employer]    If you have been instructed to have an in-person evaluation today at a local Urgent Care facility, please use the link below. It will take you to a list of all of our available [facility], including address, phone number and hours of operation. Please do not delay care.  [facility]  If you or a family member do not have a primary care provider, use the link below to schedule a visit and establish care. When you choose a [facility] primary care physician or advanced practice provider, you gain a long-term partner in health. Find a Primary Care Provider  Learn more about [facility]'s in-office and virtual care options: [facility] - Get Care Now

## 2023-03-21 ENCOUNTER — Telehealth: Payer: BC Managed Care – PPO | Admitting: Physician Assistant

## 2023-03-21 DIAGNOSIS — R3989 Other symptoms and signs involving the genitourinary system: Secondary | ICD-10-CM | POA: Diagnosis not present

## 2023-03-21 MED ORDER — SULFAMETHOXAZOLE-TRIMETHOPRIM 800-160 MG PO TABS: 1.0000 | ORAL_TABLET | Freq: Two times a day (BID) | ORAL | 0 refills | Status: DC

## 2023-03-21 NOTE — Patient Instructions (Signed)
Gabrielle Jackson, thank you for joining [name], PA-C for today's virtual visit.  While this provider is not your primary care provider (PCP), if your PCP is located in our provider database this encounter information will be shared with them immediately following your visit.   A [facility] MyChart account gives you access to today's visit and all your visits, tests, and labs performed at [facility] " click here if you don't have a [facility] MyChart account or go to [email or web address]  Consent: (Patient) Gabrielle Jackson provided verbal consent for this virtual visit at the beginning of the encounter.  Current Medications:  Current Outpatient Medications:    sulfamethoxazole-trimethoprim (BACTRIM DS) 800-160 MG tablet, Take 1 tablet by mouth 2 (two) times daily., Disp: 10 tablet, Rfl: 0   amphetamine-dextroamphetamine (ADDERALL) 20 MG tablet, Take 20 mg by mouth 2 (two) times daily., Disp: , Rfl:    diclofenac (VOLTAREN) 75 MG EC tablet, Take 1 tablet (75 mg total) by mouth 2 (two) times daily., Disp: 30 tablet, Rfl: 0   potassium chloride (KLOR-CON) 10 MEQ tablet, Take 2 tablets (20 mEq total) by mouth daily., Disp: 6 tablet, Rfl: 0   venlafaxine XR (EFFEXOR-XR) 37.5 MG 24 hr capsule, Take 37.5 mg by mouth daily., Disp: , Rfl:    Medications ordered in this encounter:  Meds ordered this encounter  Medications   sulfamethoxazole-trimethoprim (BACTRIM DS) 800-160 MG tablet    Sig: Take 1 tablet by mouth 2 (two) times daily.    Dispense:  10 tablet    Refill:  0    Order Specific Question:   Supervising Provider    Answer:   Merrilee Jansky [id number]     *If you need refills on other medications prior to your next appointment, please contact your pharmacy*  Follow-Up: Call back or seek an in-person evaluation if the symptoms worsen or if the condition fails to improve as anticipated.  [facility] [phone]  Other  Instructions Urinary Tract Infection, Adult  A urinary tract infection (UTI) is an infection of any part of the urinary tract. The urinary tract includes the kidneys, ureters, bladder, and urethra. These organs make, store, and get rid of urine in the body. An upper UTI affects the ureters and kidneys. A lower UTI affects the bladder and urethra. What are the causes? Most urinary tract infections are caused by bacteria in your genital area around your urethra, where urine leaves your body. These bacteria grow and cause inflammation of your urinary tract. What increases the risk? You are more likely to develop this condition if: You have a urinary catheter that stays in place. You are not able to control when you urinate or have a bowel movement (incontinence). You are female and you: Use a spermicide or diaphragm for birth control. Have low estrogen levels. Are pregnant. You have certain genes that increase your risk. You are sexually active. You take antibiotic medicines. You have a condition that causes your flow of urine to slow down, such as: An enlarged prostate, if you are female. Blockage in your urethra. A kidney stone. A nerve condition that affects your bladder control (neurogenic bladder). Not getting enough to drink, or not urinating often. You have certain medical conditions, such as: Diabetes. A weak disease-fighting system (immunesystem). Sickle cell disease. Gout. Spinal cord injury. What are the signs or symptoms? Symptoms of this condition include: Needing to urinate right away (urgency). Frequent urination. This may include small amounts  of urine each time you urinate. Pain or burning with urination. Blood in the urine. Urine that smells bad or unusual. Trouble urinating. Cloudy urine. Vaginal discharge, if you are female. Pain in the abdomen or the lower back. You may also have: Vomiting or a decreased appetite. Confusion. Irritability or tiredness. A  fever or chills. Diarrhea. The first symptom in older adults may be confusion. In some cases, they may not have any symptoms until the infection has worsened. How is this diagnosed? This condition is diagnosed based on your medical history and a physical exam. You may also have other tests, including: Urine tests. Blood tests. Tests for STIs (sexually transmitted infections). If you have had more than one UTI, a cystoscopy or imaging studies may be done to determine the cause of the infections. How is this treated? Treatment for this condition includes: Antibiotic medicine. Over-the-counter medicines to treat discomfort. Drinking enough water to stay hydrated. If you have frequent infections or have other conditions such as a kidney stone, you may need to see a health care provider who specializes in the urinary tract (urologist). In rare cases, urinary tract infections can cause sepsis. Sepsis is a life-threatening condition that occurs when the body responds to an infection. Sepsis is treated in the hospital with IV antibiotics, fluids, and other medicines. Follow these instructions at home:  Medicines Take over-the-counter and prescription medicines only as told by your health care provider. If you were prescribed an antibiotic medicine, take it as told by your health care provider. Do not stop using the antibiotic even if you start to feel better. General instructions Make sure you: Empty your bladder often and completely. Do not hold urine for long periods of time. Empty your bladder after sex. Wipe from front to back after urinating or having a bowel movement if you are female. Use each tissue only one time when you wipe. Drink enough fluid to keep your urine pale yellow. Keep all follow-up visits. This is important. Contact a health care provider if: Your symptoms do not get better after 1-2 days. Your symptoms go away and then return. Get help right away if: You have severe  pain in your back or your lower abdomen. You have a fever or chills. You have nausea or vomiting. Summary A urinary tract infection (UTI) is an infection of any part of the urinary tract, which includes the kidneys, ureters, bladder, and urethra. Most urinary tract infections are caused by bacteria in your genital area. Treatment for this condition often includes antibiotic medicines. If you were prescribed an antibiotic medicine, take it as told by your health care provider. Do not stop using the antibiotic even if you start to feel better. Keep all follow-up visits. This is important. This information is not intended to replace advice given to you by your health care provider. Make sure you discuss any questions you have with your health care provider. Document Revised: 01/24/2020 Document Reviewed: 01/29/2020 Elsevier Patient Education  2024 Elsevier Inc.    If you have been instructed to have an in-person evaluation today at a local Urgent Care facility, please use the link below. It will take you to a list of all of our available [facility], including address, phone number and hours of operation. Please do not delay care.  [facility]  If you or a family member do not have a primary care provider, use the link below to schedule a visit and establish care. When you choose  a [facility] primary care physician or advanced practice provider, you gain a long-term partner in health. Find a Primary Care Provider  Learn more about [facility]'s in-office and virtual care options: [facility] - Get Care Now

## 2023-03-21 NOTE — Progress Notes (Signed)
Virtual Visit Consent   [name], you are scheduled for a virtual visit with a [facility] provider today. Just as with appointments in the office, your consent must be obtained to participate. Your consent will be active for this visit and any virtual visit you may have with one of our providers in the next 365 days. If you have a MyChart account, a copy of this consent can be sent to you electronically.  As this is a virtual visit, video technology does not allow for your provider to perform a traditional examination. This may limit your provider's ability to fully assess your condition. If your provider identifies any concerns that need to be evaluated in person or the need to arrange testing (such as labs, EKG, etc.), we will make arrangements to do so. Although advances in technology are sophisticated, we cannot ensure that it will always work on either your end or our end. If the connection with a video visit is poor, the visit may have to be switched to a telephone visit. With either a video or telephone visit, we are not always able to ensure that we have a secure connection.  By engaging in this virtual visit, you consent to the provision of healthcare and authorize for your insurance to be billed (if applicable) for the services provided during this visit. Depending on your insurance coverage, you may receive a charge related to this service.  I need to obtain your verbal consent now. Are you willing to proceed with your visit today? Gabrielle Jackson has provided verbal consent on 03/21/2023 for a virtual visit (video or telephone). Gabrielle Loveless, PA-C  Date: 03/21/2023 9:11 AM  Virtual Visit via Video Note   I, Gabrielle Jackson, connected with  Gabrielle Jackson  ([id number], 14-Apr-1991) on 03/21/23 at  9:00 AM EDT by a video-enabled telemedicine application and verified that I am speaking with the correct person using two identifiers.  Location: Patient: Virtual  Visit Location Patient: Home Provider: Virtual Visit Location Provider: Home Office   I discussed the limitations of evaluation and management by telemedicine and the availability of in person appointments. The patient expressed understanding and agreed to proceed.    History of Present Illness: Gabrielle Jackson is a 33 y.o. who identifies as a female who was assigned female at birth, and is being seen today for possible UTI.  HPI: Urinary Tract Infection  This is a new problem. The current episode started in the past 7 days (3 days). The problem occurs every urination. The problem has been gradually worsening. The quality of the pain is described as aching. The pain is at a severity of 3/10. There has been no fever. Associated symptoms include flank pain (dull, right side low back pain), frequency, hesitancy and urgency. Pertinent negatives include no chills, discharge, hematuria, nausea, possible pregnancy, sweats or vomiting. Associated symptoms comments: Bladder pressure. She has tried increased fluids (cranberry juice, water, aleve) for the symptoms. The treatment provided no relief.     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   Social anxiety disorder 04/01/2019   Bereavement 03/13/2019   Depressive disorder 02/13/2019   GAD (generalized anxiety disorder) 09/05/2015   Partial hamstring tear 05/02/2015    Allergies:  Allergies  Allergen Reactions   Penicillins Rash   Medications:  Current Outpatient Medications:  sulfamethoxazole-trimethoprim (BACTRIM DS) 800-160 MG tablet, Take 1 tablet by mouth 2 (two) times daily., Disp: 10 tablet, Rfl: 0   amphetamine-dextroamphetamine (ADDERALL) 20 MG tablet, Take 20 mg by mouth 2 (two) times daily., Disp: , Rfl:    diclofenac (VOLTAREN) 75 MG EC tablet, Take 1 tablet (75 mg  total) by mouth 2 (two) times daily., Disp: 30 tablet, Rfl: 0   potassium chloride (KLOR-CON) 10 MEQ tablet, Take 2 tablets (20 mEq total) by mouth daily., Disp: 6 tablet, Rfl: 0   venlafaxine XR (EFFEXOR-XR) 37.5 MG 24 hr capsule, Take 37.5 mg by mouth daily., Disp: , Rfl:   Observations/Objective: Patient is well-developed, well-nourished in no acute distress.  Resting comfortably at home.  Head is normocephalic, atraumatic.  No labored breathing.  Speech is clear and coherent with logical content.  Patient is alert and oriented at baseline.    Assessment and Plan: 1. Suspected UTI - sulfamethoxazole-trimethoprim (BACTRIM DS) 800-160 MG tablet; Take 1 tablet by mouth 2 (two) times daily.  Dispense: 10 tablet; Refill: 0  - Worsening symptoms.  - Will treat empirically with Bactrim - May use AZO for bladder spasms - Continue to push fluids.  - Seek in person evaluation for urine culture if symptoms do not improve or if they worsen.    Follow Up Instructions: I discussed the assessment and treatment plan with the patient. The patient was provided an opportunity to ask questions and all were answered. The patient agreed with the plan and demonstrated an understanding of the instructions.  A copy of instructions were sent to the patient via MyChart unless otherwise noted below.    The patient was advised to call back or seek an in-person evaluation if the symptoms worsen or if the condition fails to improve as anticipated.  Time:  I spent 8 minutes with the patient via telehealth technology discussing the above problems/concerns.    Gabrielle Loveless, PA-C

## 2023-05-03 ENCOUNTER — Telehealth: Payer: BC Managed Care – PPO | Admitting: Physician Assistant

## 2023-05-03 DIAGNOSIS — B9689 Other specified bacterial agents as the cause of diseases classified elsewhere: Secondary | ICD-10-CM

## 2023-05-03 DIAGNOSIS — J208 Acute bronchitis due to other specified organisms: Secondary | ICD-10-CM

## 2023-05-03 MED ORDER — PSEUDOEPH-BROMPHEN-DM 30-2-10 MG/5ML PO SYRP: 5.0000 mL | ORAL_SOLUTION | Freq: Four times a day (QID) | ORAL | 0 refills | Status: DC | PRN

## 2023-05-03 MED ORDER — AZITHROMYCIN 250 MG PO TABS: ORAL_TABLET | ORAL | 0 refills | Status: AC

## 2023-05-03 MED ORDER — ALBUTEROL SULFATE HFA 108 (90 BASE) MCG/ACT IN AERS: 1.0000 | INHALATION_SPRAY | Freq: Four times a day (QID) | RESPIRATORY_TRACT | 0 refills | Status: AC | PRN

## 2023-05-03 NOTE — Progress Notes (Signed)
Virtual Visit Consent   Gabrielle Jackson, you are scheduled for a virtual visit with a [facility] provider today. Just as with appointments in the office, your consent must be obtained to participate. Your consent will be active for this visit and any virtual visit you may have with one of our providers in the next 365 days. If you have a MyChart account, a copy of this consent can be sent to you electronically.  As this is a virtual visit, video technology does not allow for your provider to perform a traditional examination. This may limit your provider's ability to fully assess your condition. If your provider identifies any concerns that need to be evaluated in person or the need to arrange testing (such as labs, EKG, etc.), we will make arrangements to do so. Although advances in technology are sophisticated, we cannot ensure that it will always work on either your end or our end. If the connection with a video visit is poor, the visit may have to be switched to a telephone visit. With either a video or telephone visit, we are not always able to ensure that we have a secure connection.  By engaging in this virtual visit, you consent to the provision of healthcare and authorize for your insurance to be billed (if applicable) for the services provided during this visit. Depending on your insurance coverage, you may receive a charge related to this service.  I need to obtain your verbal consent now. Are you willing to proceed with your visit today? Gabrielle Jackson has provided verbal consent on 05/03/2023 for a virtual visit (video or telephone). Gabrielle Loveless, PA-C  Date: 05/03/2023 4:26 PM  Virtual Visit via Video Note   I, Gabrielle Jackson, connected with  Gabrielle Jackson  ([id number], 01-05-1991) on 05/03/23 at  4:15 PM EDT by a video-enabled telemedicine application and verified that I am speaking with the correct person using two identifiers.  Location: Patient: Virtual  Visit Location Patient: Home Provider: Virtual Visit Location Provider: Home Office   I discussed the limitations of evaluation and management by telemedicine and the availability of in person appointments. The patient expressed understanding and agreed to proceed.    History of Present Illness: Gabrielle Jackson is a 32 y.o. who identifies as a female who was assigned female at birth, and is being seen today for cough and congestion.  HPI: Cough This is a new problem. The current episode started 1 to 4 weeks ago (started over a week ago and has worsened over last two days). The problem has been gradually worsening. The problem occurs every few minutes. The cough is Productive of sputum and productive of purulent sputum. Associated symptoms include chest pain (burning), chills, ear pain (right), a fever (low grades), headaches, nasal congestion, postnasal drip, rhinorrhea, a sore throat and wheezing (unknown). Pertinent negatives include no ear congestion, myalgias, shortness of breath or sweats. The symptoms are aggravated by lying down. Treatments tried: alka seltzer cold and cough, aleve, robitussin. The treatment provided no relief. Her past medical history is significant for bronchitis and pneumonia. There is no history of environmental allergies.   2 kids have both been diagnosed with walking pneumonia  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   Social anxiety disorder 04/01/2019   Bereavement 03/13/2019   Depressive disorder 02/13/2019  GAD (generalized anxiety disorder) 09/05/2015   Partial hamstring tear 05/02/2015    Allergies:  Allergies  Allergen Reactions   Penicillins Rash   Medications:  Current Outpatient Medications:    albuterol (VENTOLIN HFA) 108 (90 Base) MCG/ACT inhaler, Inhale 1-2 puffs into the lungs every  6 (six) hours as needed., Disp: 8 g, Rfl: 0   azithromycin (ZITHROMAX) 250 MG tablet, Take 2 tablets on day 1, then 1 tablet daily on days 2 through 5, Disp: 6 tablet, Rfl: 0   brompheniramine-pseudoephedrine-DM 30-2-10 MG/5ML syrup, Take 5 mLs by mouth 4 (four) times daily as needed., Disp: 120 mL, Rfl: 0   amphetamine-dextroamphetamine (ADDERALL) 20 MG tablet, Take 20 mg by mouth 2 (two) times daily., Disp: , Rfl:    diclofenac (VOLTAREN) 75 MG EC tablet, Take 1 tablet (75 mg total) by mouth 2 (two) times daily., Disp: 30 tablet, Rfl: 0   potassium chloride (KLOR-CON) 10 MEQ tablet, Take 2 tablets (20 mEq total) by mouth daily., Disp: 6 tablet, Rfl: 0   sulfamethoxazole-trimethoprim (BACTRIM DS) 800-160 MG tablet, Take 1 tablet by mouth 2 (two) times daily., Disp: 10 tablet, Rfl: 0   venlafaxine XR (EFFEXOR-XR) 37.5 MG 24 hr capsule, Take 37.5 mg by mouth daily., Disp: , Rfl:   Observations/Objective: Patient is well-developed, well-nourished in no acute distress.  Resting comfortably at home.  Head is normocephalic, atraumatic.  No labored breathing.  Speech is clear and coherent with logical content.  Patient is alert and oriented at baseline.    Assessment and Plan: 1. Acute bacterial bronchitis - azithromycin (ZITHROMAX) 250 MG tablet; Take 2 tablets on day 1, then 1 tablet daily on days 2 through 5  Dispense: 6 tablet; Refill: 0 - albuterol (VENTOLIN HFA) 108 (90 Base) MCG/ACT inhaler; Inhale 1-2 puffs into the lungs every 6 (six) hours as needed.  Dispense: 8 g; Refill: 0 - brompheniramine-pseudoephedrine-DM 30-2-10 MG/5ML syrup; Take 5 mLs by mouth 4 (four) times daily as needed.  Dispense: 120 mL; Refill: 0  - Worsening over a week despite OTC medications - Will treat with Z-pack, Albuterol, and Bromfed DM - Can continue Mucinex  - Push fluids.  - Rest.  - Steam and humidifier can help - Seek in person evaluation if worsening or symptoms fail to improve    Follow Up  Instructions: I discussed the assessment and treatment plan with the patient. The patient was provided an opportunity to ask questions and all were answered. The patient agreed with the plan and demonstrated an understanding of the instructions.  A copy of instructions were sent to the patient via MyChart unless otherwise noted below.    The patient was advised to call back or seek an in-person evaluation if the symptoms worsen or if the condition fails to improve as anticipated.    Gabrielle Loveless, PA-C

## 2023-05-03 NOTE — Patient Instructions (Signed)
Gabrielle Jackson, thank you for joining [name], PA-C for today's virtual visit.  While this provider is not your primary care provider (PCP), if your PCP is located in our provider database this encounter information will be shared with them immediately following your visit.   A [facility] MyChart account gives you access to today's visit and all your visits, tests, and labs performed at [facility] " click here if you don't have a [facility] MyChart account or go to [email or web address]  Consent: (Patient) Gabrielle Jackson provided verbal consent for this virtual visit at the beginning of the encounter.  Current Medications:  Current Outpatient Medications:    albuterol (VENTOLIN HFA) 108 (90 Base) MCG/ACT inhaler, Inhale 1-2 puffs into the lungs every 6 (six) hours as needed., Disp: 8 g, Rfl: 0   azithromycin (ZITHROMAX) 250 MG tablet, Take 2 tablets on day 1, then 1 tablet daily on days 2 through 5, Disp: 6 tablet, Rfl: 0   brompheniramine-pseudoephedrine-DM 30-2-10 MG/5ML syrup, Take 5 mLs by mouth 4 (four) times daily as needed., Disp: 120 mL, Rfl: 0   amphetamine-dextroamphetamine (ADDERALL) 20 MG tablet, Take 20 mg by mouth 2 (two) times daily., Disp: , Rfl:    diclofenac (VOLTAREN) 75 MG EC tablet, Take 1 tablet (75 mg total) by mouth 2 (two) times daily., Disp: 30 tablet, Rfl: 0   potassium chloride (KLOR-CON) 10 MEQ tablet, Take 2 tablets (20 mEq total) by mouth daily., Disp: 6 tablet, Rfl: 0   sulfamethoxazole-trimethoprim (BACTRIM DS) 800-160 MG tablet, Take 1 tablet by mouth 2 (two) times daily., Disp: 10 tablet, Rfl: 0   venlafaxine XR (EFFEXOR-XR) 37.5 MG 24 hr capsule, Take 37.5 mg by mouth daily., Disp: , Rfl:    Medications ordered in this encounter:  Meds ordered this encounter  Medications   azithromycin (ZITHROMAX) 250 MG tablet    Sig: Take 2 tablets on day 1, then 1 tablet daily on days 2 through 5    Dispense:  6 tablet     Refill:  0    Order Specific Question:   Supervising Provider    Answer:   Merrilee Jansky [[id number]]   albuterol (VENTOLIN HFA) 108 (90 Base) MCG/ACT inhaler    Sig: Inhale 1-2 puffs into the lungs every 6 (six) hours as needed.    Dispense:  8 g    Refill:  0    Order Specific Question:   Supervising Provider    Answer:   Merrilee Jansky [id number]   brompheniramine-pseudoephedrine-DM 30-2-10 MG/5ML syrup    Sig: Take 5 mLs by mouth 4 (four) times daily as needed.    Dispense:  120 mL    Refill:  0    Order Specific Question:   Supervising Provider    Answer:   Merrilee Jansky [[id number]]     *If you need refills on other medications prior to your next appointment, please contact your pharmacy*  Follow-Up: Call back or seek an in-person evaluation if the symptoms worsen or if the condition fails to improve as anticipated.  [facility] [phone]  Other Instructions Acute Bronchitis, Adult  Acute bronchitis is sudden inflammation of the main airways (bronchi) that come off the windpipe (trachea) in the lungs. The swelling causes the airways to get smaller and make more mucus than normal. This can make it hard to breathe and can cause coughing or noisy breathing (wheezing). Acute bronchitis may last several weeks. The cough may last  longer. Allergies, asthma, and exposure to smoke may make the condition worse. What are the causes? This condition can be caused by germs and by substances that irritate the lungs, including: Cold and flu viruses. The most common cause of this condition is the virus that causes the common cold. Bacteria. This is less common. Breathing in substances that irritate the lungs, including: Smoke from cigarettes and other forms of tobacco. Dust and pollen. Fumes from household cleaning products, gases, or burned fuel. Indoor or outdoor air pollution. What increases the risk? The following factors may make you more likely to develop this  condition: A weak body's defense system, also called the immune system. A condition that affects your lungs and breathing, such as asthma. What are the signs or symptoms? Common symptoms of this condition include: Coughing. This may bring up clear, yellow, or green mucus from your lungs (sputum). Wheezing. Runny or stuffy nose. Having too much mucus in your lungs (chest congestion). Shortness of breath. Aches and pains, including sore throat or chest. How is this diagnosed? This condition is usually diagnosed based on: Your symptoms and medical history. A physical exam. You may also have other tests, including tests to rule out other conditions, such as pneumonia. These tests include: A test of lung function. Test of a mucus sample to look for the presence of bacteria. Tests to check the oxygen level in your blood. Blood tests. Chest X-ray. How is this treated? Most cases of acute bronchitis clear up over time without treatment. Your health care provider may recommend: Drinking more fluids to help thin your mucus so it is easier to cough up. Taking inhaled medicine (inhaler) to improve air flow in and out of your lungs. Using a vaporizer or a humidifier. These are machines that add water to the air to help you breathe better. Taking a medicine that thins mucus and clears congestion (expectorant). Taking a medicine that prevents or stops coughing (cough suppressant). It is not common to take an antibiotic medicine for this condition. Follow these instructions at home:  Take over-the-counter and prescription medicines only as told by your health care provider. Use an inhaler, vaporizer, or humidifier as told by your health care provider. Take two teaspoons (10 mL) of honey at bedtime to lessen coughing at night. Drink enough fluid to keep your urine pale yellow. Do not use any products that contain nicotine or tobacco. These products include cigarettes, chewing tobacco, and vaping  devices, such as e-cigarettes. If you need help quitting, ask your health care provider. Get plenty of rest. Return to your normal activities as told by your health care provider. Ask your health care provider what activities are safe for you. Keep all follow-up visits. This is important. How is this prevented? To lower your risk of getting this condition again: Wash your hands often with soap and water for at least 20 seconds. If soap and water are not available, use hand sanitizer. Avoid contact with people who have cold symptoms. Try not to touch your mouth, nose, or eyes with your hands. Avoid breathing in smoke or chemical fumes. Breathing smoke or chemical fumes will make your condition worse. Get the flu shot every year. Contact a health care provider if: Your symptoms do not improve after 2 weeks. You have trouble coughing up the mucus. Your cough keeps you awake at night. You have a fever. Get help right away if you: Cough up blood. Feel pain in your chest. Have severe shortness of breath. Faint  or keep feeling like you are going to faint. Have a severe headache. Have a fever or chills that get worse. These symptoms may represent a serious problem that is an emergency. Do not wait to see if the symptoms will go away. Get medical help right away. Call your local emergency services (911 in the [place] Do not drive yourself to the hospital. Summary Acute bronchitis is inflammation of the main airways (bronchi) that come off the windpipe (trachea) in the lungs. The swelling causes the airways to get smaller and make more mucus than normal. Drinking more fluids can help thin your mucus so it is easier to cough up. Take over-the-counter and prescription medicines only as told by your health care provider. Do not use any products that contain nicotine or tobacco. These products include cigarettes, chewing tobacco, and vaping devices, such as e-cigarettes. If you need help quitting, ask  your health care provider. Contact a health care provider if your symptoms do not improve after 2 weeks. This information is not intended to replace advice given to you by your health care provider. Make sure you discuss any questions you have with your health care provider. Document Revised: 09/28/2021 Document Reviewed: 10/19/2020 Elsevier Patient Education  2024 Elsevier Inc.    If you have been instructed to have an in-person evaluation today at a local Urgent Care facility, please use the link below. It will take you to a list of all of our available [facility], including address, phone number and hours of operation. Please do not delay care.  [facility]  If you or a family member do not have a primary care provider, use the link below to schedule a visit and establish care. When you choose a [facility] primary care physician or advanced practice provider, you gain a long-term partner in health. Find a Primary Care Provider  Learn more about [facility]'s in-office and virtual care options:  - Get Care Now

## 2023-06-23 ENCOUNTER — Telehealth: Payer: BC Managed Care – PPO | Admitting: Family Medicine

## 2023-06-23 DIAGNOSIS — H1031 Unspecified acute conjunctivitis, right eye: Secondary | ICD-10-CM | POA: Diagnosis not present

## 2023-06-23 MED ORDER — POLYMYXIN B-TRIMETHOPRIM 10000-0.1 UNIT/ML-% OP SOLN: 1.0000 [drp] | OPHTHALMIC | 0 refills | Status: AC

## 2023-06-23 NOTE — Progress Notes (Signed)
Virtual Visit Consent   Gabrielle Jackson, you are scheduled for a virtual visit with a [facility] provider today. Just as with appointments in the office, your consent must be obtained to participate. Your consent will be active for this visit and any virtual visit you may have with one of our providers in the next 365 days. If you have a MyChart account, a copy of this consent can be sent to you electronically.  As this is a virtual visit, video technology does not allow for your provider to perform a traditional examination. This may limit your provider's ability to fully assess your condition. If your provider identifies any concerns that need to be evaluated in person or the need to arrange testing (such as labs, EKG, etc.), we will make arrangements to do so. Although advances in technology are sophisticated, we cannot ensure that it will always work on either your end or our end. If the connection with a video visit is poor, the visit may have to be switched to a telephone visit. With either a video or telephone visit, we are not always able to ensure that we have a secure connection.  By engaging in this virtual visit, you consent to the provision of healthcare and authorize for your insurance to be billed (if applicable) for the services provided during this visit. Depending on your insurance coverage, you may receive a charge related to this service.  I need to obtain your verbal consent now. Are you willing to proceed with your visit today? Gabrielle Jackson has provided verbal consent on 06/23/2023 for a virtual visit (video or telephone). Georgana Curio, FNP  Date: 06/23/2023 4:02 PM  Virtual Visit via Video Note   I, Georgana Curio, connected with  Gabrielle Jackson  ([id number], 1990/12/21) on 06/23/23 at  4:00 PM EST by a video-enabled telemedicine application and verified that I am speaking with the correct person using two identifiers.  Location: Patient: Virtual Visit Location  Patient: Home Provider: Virtual Visit Location Provider: Home Office   I discussed the limitations of evaluation and management by telemedicine and the availability of in person appointments. The patient expressed understanding and agreed to proceed.    History of Present Illness: Gabrielle Jackson is a 32 y.o. who identifies as a female who was assigned female at birth, and is being seen today for redness, irritation, drainage and matting of rt eye for 3 days persistent. 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   Social anxiety disorder 04/01/2019   Bereavement 03/13/2019   Depressive disorder 02/13/2019   GAD (generalized anxiety disorder) 09/05/2015   Partial hamstring tear 05/02/2015    Allergies:  Allergies  Allergen Reactions   Penicillins Rash   Medications:  Current Outpatient Medications:    albuterol (VENTOLIN HFA) 108 (90 Base) MCG/ACT inhaler, Inhale 1-2 puffs into the lungs every 6 (six) hours as needed., Disp: 8 g, Rfl: 0   amphetamine-dextroamphetamine (ADDERALL) 20 MG tablet, Take 20 mg by mouth 2 (two) times daily., Disp: , Rfl:    brompheniramine-pseudoephedrine-DM 30-2-10 MG/5ML syrup, Take 5 mLs by mouth 4 (four) times daily as needed., Disp: 120 mL, Rfl: 0   diclofenac (VOLTAREN) 75 MG EC tablet, Take 1 tablet (75 mg total) by mouth 2 (two) times daily., Disp: 30 tablet, Rfl: 0  potassium chloride (KLOR-CON) 10 MEQ tablet, Take 2 tablets (20 mEq total) by mouth daily., Disp: 6 tablet, Rfl: 0   sulfamethoxazole-trimethoprim (BACTRIM DS) 800-160 MG tablet, Take 1 tablet by mouth 2 (two) times daily., Disp: 10 tablet, Rfl: 0   venlafaxine XR (EFFEXOR-XR) 37.5 MG 24 hr capsule, Take 37.5 mg by mouth daily., Disp: , Rfl:   Observations/Objective: Patient is well-developed, well-nourished in no  acute distress.  Resting comfortably  at home.  Head is normocephalic, atraumatic.  No labored breathing.  Speech is clear and coherent with logical content.  Patient is alert and oriented at baseline.    Assessment and Plan: 1. Acute bacterial conjunctivitis of right eye (Primary) Ways to prevent transmission discussed UC if sx persist or worsen.   Follow Up Instructions: I discussed the assessment and treatment plan with the patient. The patient was provided an opportunity to ask questions and all were answered. The patient agreed with the plan and demonstrated an understanding of the instructions.  A copy of instructions were sent to the patient via MyChart unless otherwise noted below.     The patient was advised to call back or seek an in-person evaluation if the symptoms worsen or if the condition fails to improve as anticipated.    Georgana Curio, FNP

## 2023-06-23 NOTE — Patient Instructions (Signed)
 Bacterial Conjunctivitis, Adult Bacterial conjunctivitis is an infection of the clear membrane that covers the white part of the eye and the inner surface of the eyelid (conjunctiva). When the blood vessels in the conjunctiva become inflamed, the eye becomes red or pink. The eye often feels irritated or itchy. Bacterial conjunctivitis spreads easily from person to person (is contagious). It also spreads easily from one eye to the other eye. What are the causes? This condition is caused by bacteria. You may get the infection if you come into close contact with: A person who is infected with the bacteria. Items that are contaminated with the bacteria, such as a face towel, contact lens solution, or eye makeup. What increases the risk? You are more likely to develop this condition if: You are exposed to other people who have the infection. You wear contact lenses. You have a sinus infection. You have had a recent eye injury or surgery. You have a weak body defense system (immune system). You have a medical condition that causes dry eyes. What are the signs or symptoms? Symptoms of this condition include: Thick, yellowish discharge from the eye. This may turn into a crust on the eyelid overnight and cause your eyelids to stick together. Tearing or watery eyes. Itchy eyes. Burning feeling in your eyes. Eye redness. Swollen eyelids. Blurred vision. How is this diagnosed? This condition is diagnosed based on your symptoms and medical history. Your health care provider may also take a sample of discharge from your eye to find the cause of your infection. How is this treated? This condition may be treated with: Antibiotic eye drops or ointment to clear the infection more quickly and prevent the spread of infection to others. Antibiotic medicines taken by mouth (orally) to treat infections that do not respond to drops or ointments or that last longer than 10 days. Cool, wet cloths (cool  compresses) placed on the eyes. Artificial tears applied 2-6 times a day. Follow these instructions at home: Medicines Take or apply your antibiotic medicine as told by your health care provider. Do not stop using the antibiotic, even if your condition improves, unless directed by your health care provider. Take or apply over-the-counter and prescription medicines only as told by your health care provider. Be very careful to avoid touching the edge of your eyelid with the eye-drop bottle or the ointment tube when you apply medicines to the affected eye. This will keep you from spreading the infection to your other eye or to other people. Managing discomfort Gently wipe away any drainage from your eye with a warm, wet washcloth or a cotton ball. Apply a clean, cool compress to your eye for 10-20 minutes, 3-4 times a day. General instructions Do not wear contact lenses until the inflammation is gone and your health care provider says it is safe to wear them again. Ask your health care provider how to sterilize or replace your contact lenses before you use them again. Wear glasses until you can resume wearing contact lenses. Avoid wearing eye makeup until the inflammation is gone. Throw away any old eye cosmetics that may be contaminated. Change or wash your pillowcase every day. Do not share towels or washcloths. This may spread the infection. Wash your hands often with soap and water for at least 20 seconds and especially before touching your face or eyes. Use paper towels to dry your hands. Avoid touching or rubbing your eyes. Do not drive or use heavy machinery if your vision is blurred. Contact  a health care provider if: You have a fever. Your symptoms do not get better after 10 days. Get help right away if: You have a fever and your symptoms suddenly get worse. You have severe pain when you move your eye. You have facial pain, redness, or swelling. You have a sudden loss of  vision. Summary Bacterial conjunctivitis is an infection of the clear membrane that covers the white part of the eye and the inner surface of the eyelid (conjunctiva). Bacterial conjunctivitis spreads easily from eye to eye and from person to person (is contagious). Wash your hands often with soap and water for at least 20 seconds and especially before touching your face or eyes. Use paper towels to dry your hands. Take or apply your antibiotic medicine as told by your health care provider. Do not stop using the antibiotic even if your condition improves. Contact a health care provider if you have a fever or if your symptoms do not get better after 10 days. Get help right away if you have a sudden loss of vision. This information is not intended to replace advice given to you by your health care provider. Make sure you discuss any questions you have with your health care provider. Document Revised: 09/28/2020 Document Reviewed: 09/28/2020 Elsevier Patient Education  2024 [employer].

## 2024-02-23 ENCOUNTER — Telehealth: Admitting: Physician Assistant

## 2024-02-23 DIAGNOSIS — J069 Acute upper respiratory infection, unspecified: Secondary | ICD-10-CM

## 2024-02-23 MED ORDER — PSEUDOEPH-BROMPHEN-DM 30-2-10 MG/5ML PO SYRP: 5.0000 mL | ORAL_SOLUTION | Freq: Four times a day (QID) | ORAL | 0 refills | Status: AC | PRN

## 2024-02-23 MED ORDER — BENZONATATE 100 MG PO CAPS: 100.0000 mg | ORAL_CAPSULE | Freq: Two times a day (BID) | ORAL | 0 refills | Status: AC | PRN

## 2024-02-23 MED ORDER — FLUTICASONE PROPIONATE 50 MCG/ACT NA SUSP: 2.0000 | Freq: Every day | NASAL | 6 refills | Status: AC

## 2024-02-23 NOTE — Progress Notes (Signed)
 Virtual Visit Consent   Gabrielle Jackson, you are scheduled for a virtual visit with a [facility] provider today. Just as with appointments in the office, your consent must be obtained to participate. Your consent will be active for this visit and any virtual visit you may have with one of our providers in the next 365 days. If you have a MyChart account, a copy of this consent can be sent to you electronically.  As this is a virtual visit, video technology does not allow for your provider to perform a traditional examination. This may limit your provider's ability to fully assess your condition. If your provider identifies any concerns that need to be evaluated in person or the need to arrange testing (such as labs, EKG, etc.), we will make arrangements to do so. Although advances in technology are sophisticated, we cannot ensure that it will always work on either your end or our end. If the connection with a video visit is poor, the visit may have to be switched to a telephone visit. With either a video or telephone visit, we are not always able to ensure that we have a secure connection.  By engaging in this virtual visit, you consent to the provision of healthcare and authorize for your insurance to be billed (if applicable) for the services provided during this visit. Depending on your insurance coverage, you may receive a charge related to this service.  I need to obtain your verbal consent now. Are you willing to proceed with your visit today? [name] has provided verbal consent on 02/23/2024 for a virtual visit (video or telephone). Gabrielle Jackson, [place]  Date: 02/23/2024 11:05 AM   Virtual Visit via Video Note   I, Gabrielle Jackson, connected with  KARINDA CABRIALES  ([id number], 10/10/1990) on 02/23/24 at 11:00 AM EDT by a video-enabled telemedicine application and verified that I am speaking with the correct person using two identifiers.  Location: Patient: Virtual Visit  Location Patient: Home Provider: Virtual Visit Location Provider: Home Office   I discussed the limitations of evaluation and management by telemedicine and the availability of in person appointments. The patient expressed understanding and agreed to proceed.    History of Present Illness: Gabrielle Jackson is a 33 y.o. who identifies as a female who was assigned female at birth, and is being seen today for URI.  HPI: URI  This is a new problem. The current episode started in the past 7 days. The problem has been unchanged. Associated symptoms include congestion, coughing, rhinorrhea and sinus pain. She has tried antihistamine for the symptoms. The treatment provided mild relief.    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   Social anxiety disorder 04/01/2019   Bereavement 03/13/2019   Depressive disorder 02/13/2019   GAD (generalized anxiety disorder) 09/05/2015   Partial hamstring tear 05/02/2015    Allergies:  Allergies  Allergen Reactions   Penicillins Rash   Medications:  Current Outpatient Medications:    albuterol  (VENTOLIN  HFA) 108 (90 Base) MCG/ACT inhaler, Inhale 1-2 puffs into the lungs every 6 (six) hours as needed., Disp: 8 g, Rfl: 0   amphetamine-dextroamphetamine (ADDERALL) 20 MG tablet, Take 20 mg by mouth 2 (two) times daily., Disp: , Rfl:    brompheniramine-pseudoephedrine-DM 30-2-10 MG/5ML syrup, Take 5 mLs by mouth 4 (four) times daily as needed.,  Disp: 120 mL, Rfl: 0   diclofenac  (VOLTAREN ) 75 MG EC tablet, Take 1 tablet (75 mg total) by mouth 2 (two) times daily., Disp: 30 tablet, Rfl: 0   potassium chloride  (KLOR-CON ) 10 MEQ tablet, Take 2 tablets (20 mEq total) by mouth daily., Disp: 6 tablet, Rfl: 0   sulfamethoxazole -trimethoprim  (BACTRIM  DS) 800-160 MG tablet, Take 1 tablet by mouth 2 (two) times  daily., Disp: 10 tablet, Rfl: 0   venlafaxine  XR (EFFEXOR -XR) 37.5 MG 24 hr capsule, Take 37.5 mg by mouth daily., Disp: , Rfl:   Observations/Objective: Patient is well-developed, well-nourished in no acute distress.  Resting comfortably  at home.  Head is normocephalic, atraumatic.  No labored breathing.  Speech is clear and coherent with logical content.  Patient is alert and oriented at baseline.    Assessment and Plan: 1. Upper respiratory tract infection, unspecified type (Primary)  Patient presenting with URI. Differentials include allergic rhinitis, COVID,  bacterial pneumonia, sinusitis. Do not suspect underlying cardiopulmonary process. I considered, but think unlikely, dangerous causes of this patient's symptoms to include ACS, CHF or pneumonia, pneumothorax. Patient is nontoxic and not in need of emergent medical intervention. I did advise patient to complete an at home COVID test.  Plan: reassurance, reassessment, over the counter medications, discharge with PCP follow-up  Follow Up Instructions: I discussed the assessment and treatment plan with the patient. The patient was provided an opportunity to ask questions and all were answered. The patient agreed with the plan and demonstrated an understanding of the instructions.  A copy of instructions were sent to the patient via MyChart unless otherwise noted below.    The patient was advised to call back or seek an in-person evaluation if the symptoms worsen or if the condition fails to improve as anticipated.    Gabrielle Shuck, PA-C

## 2024-02-23 NOTE — Patient Instructions (Signed)
  Harlene LITTIE Loge, thank you for joining Teena Shuck, PA-C for today's virtual visit.  While this provider is not your primary care provider (PCP), if your PCP is located in our provider database this encounter information will be shared with them immediately following your visit.   A [facility] MyChart account gives you access to today's visit and all your visits, tests, and labs performed at [facility]  click here if you don't have a [facility] MyChart account or go to [email or web address]  Consent: (Patient) Harlene LITTIE Loge provided verbal consent for this virtual visit at the beginning of the encounter.  Current Medications:  Current Outpatient Medications:    albuterol  (VENTOLIN  HFA) 108 (90 Base) MCG/ACT inhaler, Inhale 1-2 puffs into the lungs every 6 (six) hours as needed., Disp: 8 g, Rfl: 0   amphetamine-dextroamphetamine (ADDERALL) 20 MG tablet, Take 20 mg by mouth 2 (two) times daily., Disp: , Rfl:    brompheniramine-pseudoephedrine-DM 30-2-10 MG/5ML syrup, Take 5 mLs by mouth 4 (four) times daily as needed., Disp: 120 mL, Rfl: 0   diclofenac  (VOLTAREN ) 75 MG EC tablet, Take 1 tablet (75 mg total) by mouth 2 (two) times daily., Disp: 30 tablet, Rfl: 0   potassium chloride  (KLOR-CON ) 10 MEQ tablet, Take 2 tablets (20 mEq total) by mouth daily., Disp: 6 tablet, Rfl: 0   sulfamethoxazole -trimethoprim  (BACTRIM  DS) 800-160 MG tablet, Take 1 tablet by mouth 2 (two) times daily., Disp: 10 tablet, Rfl: 0   venlafaxine  XR (EFFEXOR -XR) 37.5 MG 24 hr capsule, Take 37.5 mg by mouth daily., Disp: , Rfl:    Medications ordered in this encounter:  No orders of the defined types were placed in this encounter.    *If you need refills on other medications prior to your next appointment, please contact your pharmacy*  Follow-Up: Call back or seek an in-person evaluation if the symptoms worsen or if the condition fails to improve as anticipated.  [facility] [phone]  Other Instructions Please report to the nearest Emergency room with any worsening symptoms. Follow up with primary care provider (PCP) in 2 -3 days.    If you have been instructed to have an in-person evaluation today at a local Urgent Care facility, please use the link below. It will take you to a list of all of our available [facility], including address, phone number and hours of operation. Please do not delay care.  [facility]  If you or a family member do not have a primary care provider, use the link below to schedule a visit and establish care. When you choose a [facility] primary care physician or advanced practice provider, you gain a long-term partner in health. Find a Primary Care Provider  Learn more about [facility]'s in-office and virtual care options: [facility] - Get Care Now

## 2024-02-26 ENCOUNTER — Telehealth: Admitting: Physician Assistant

## 2024-02-26 DIAGNOSIS — J0140 Acute pansinusitis, unspecified: Secondary | ICD-10-CM

## 2024-02-26 DIAGNOSIS — R051 Acute cough: Secondary | ICD-10-CM

## 2024-02-26 MED ORDER — DOXYCYCLINE HYCLATE 100 MG PO TABS: 100.0000 mg | ORAL_TABLET | Freq: Two times a day (BID) | ORAL | 0 refills | Status: AC

## 2024-02-26 NOTE — Progress Notes (Signed)
 Virtual Visit Consent   Gabrielle Jackson, you are scheduled for a virtual visit with a [facility] provider today. Just as with appointments in the office, your consent must be obtained to participate. Your consent will be active for this visit and any virtual visit you may have with one of our providers in the next 365 days. If you have a MyChart account, a copy of this consent can be sent to you electronically.  As this is a virtual visit, video technology does not allow for your provider to perform a traditional examination. This may limit your provider's ability to fully assess your condition. If your provider identifies any concerns that need to be evaluated in person or the need to arrange testing (such as labs, EKG, etc.), we will make arrangements to do so. Although advances in technology are sophisticated, we cannot ensure that it will always work on either your end or our end. If the connection with a video visit is poor, the visit may have to be switched to a telephone visit. With either a video or telephone visit, we are not always able to ensure that we have a secure connection.  By engaging in this virtual visit, you consent to the provision of healthcare and authorize for your insurance to be billed (if applicable) for the services provided during this visit. Depending on your insurance coverage, you may receive a charge related to this service.  I need to obtain your verbal consent now. Are you willing to proceed with your visit today? SHARINA PETRE has provided verbal consent on 02/26/2024 for a virtual visit (video or telephone). Daley Mooradian, PA-C  Date: 02/26/2024 11:32 AM   Virtual Visit via Video Note   I, Gabrielle Jackson, connected with  MAHDIYA MOSSBERG  ([id number], [birth year]) on 02/26/24 at 11:15 AM EDT by a video-enabled telemedicine application and verified that I am speaking with the correct person using two identifiers.  Location: Patient: Virtual Visit Location  Patient: Home Provider: Virtual Visit Location Provider: Home Office   I discussed the limitations of evaluation and management by telemedicine and the availability of in person appointments. The patient expressed understanding and agreed to proceed.    History of Present Illness: Gabrielle Jackson is a 33 y.o. who identifies as a female who was assigned female at birth, and is being seen today for URI x 1 week. Pt was seen by [facility] on 8/24, for similar complaint, noted reviewed. Reports was feeling somewhat better but now symptoms have worsened, increased sinus pressure, cough that is worse at night, intermittently productive of thick yellow, streaks of red mucus. States that her chest hurts when she coughs. Reports ear pressure, subjective fever last night. Tried OTC cold medicine, mild relief. Denies chest pain, difficulty breathing, nausea, vomiting.        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   Social anxiety disorder 04/01/2019   Bereavement 03/13/2019   Depressive disorder 02/13/2019   GAD (generalized anxiety disorder) 09/05/2015   Partial hamstring tear 05/02/2015    Allergies:  Allergies  Allergen Reactions   Penicillins Rash   Medications:  Current Outpatient Medications:    doxycycline  (VIBRA -TABS) 100 MG tablet, Take 1 tablet (100 mg total) by mouth 2 (two) times daily for 7 days., Disp: 14 tablet, Rfl: 0  albuterol  (VENTOLIN  HFA) 108 (90 Base) MCG/ACT inhaler, Inhale 1-2 puffs into the lungs every 6 (six) hours as needed., Disp: 8 g, Rfl: 0   amphetamine-dextroamphetamine (ADDERALL) 20 MG tablet, Take 20 mg by mouth 2 (two) times daily., Disp: , Rfl:    benzonatate  (TESSALON ) 100 MG capsule, Take 1 capsule (100 mg total) by mouth 2 (two) times daily as needed for cough., Disp: 20  capsule, Rfl: 0   brompheniramine-pseudoephedrine-DM 30-2-10 MG/5ML syrup, Take 5 mLs by mouth 4 (four) times daily as needed., Disp: 120 mL, Rfl: 0   fluticasone  (FLONASE ) 50 MCG/ACT nasal spray, Place 2 sprays into both nostrils daily., Disp: 16 g, Rfl: 6   venlafaxine  XR (EFFEXOR -XR) 37.5 MG 24 hr capsule, Take 37.5 mg by mouth daily., Disp: , Rfl:   Observations/Objective: Patient is well-developed, well-nourished in no acute distress. Pleasant,smiling during examination,  Resting comfortably at home.  Head is normocephalic, atraumatic.  No labored breathing.  Speech is clear and coherent with logical content. No SOB noted during video assessment Patient is alert and oriented at baseline.  TTP over maxillary>frontal sinus pt patient assessment Appears have nasal congestion    Assessment and Plan: 1. Acute non-recurrent pansinusitis (Primary) - doxycycline  (VIBRA -TABS) 100 MG tablet; Take 1 tablet (100 mg total) by mouth 2 (two) times daily for 7 days.  Dispense: 14 tablet; Refill: 0  2. Acute cough    Continue Flonase , Tessalon  perles as directed from last visit Given duration of symptoms, worsening sinus pressure, suspicious for sinusitis, cough, secondary to PND. Patient is nontoxic and not in need of emergent medical intervention. Will treat with ABX, with a close in person PCP follow-up in 3-5 days.    Follow Up Instructions: I discussed the assessment and treatment plan with the patient. The patient was provided an opportunity to ask questions and all were answered. The patient agreed with the plan and demonstrated an understanding of the instructions.  A copy of instructions were sent to the patient via MyChart unless otherwise noted below.    The patient was advised to call back or seek an in-person evaluation if the symptoms worsen or if the condition fails to improve as anticipated.    Sherin Murdoch, PA-C

## 2024-02-26 NOTE — Patient Instructions (Addendum)
 Gabrielle Jackson, thank you for joining Edwina Grossberg, PA-C for today's virtual visit.  While this provider is not your primary care provider (PCP), if your PCP is located in our provider database this encounter information will be shared with them immediately following your visit.   A [facility] MyChart account gives you access to today's visit and all your visits, tests, and labs performed at [facility]  click here if you don't have a [facility] MyChart account or go to [email or web address]  Consent: (Patient) Gabrielle Jackson provided verbal consent for this virtual visit at the beginning of the encounter.  Current Medications:  Current Outpatient Medications:    doxycycline  (VIBRA -TABS) 100 MG tablet, Take 1 tablet (100 mg total) by mouth 2 (two) times daily for 7 days., Disp: 14 tablet, Rfl: 0   albuterol  (VENTOLIN  HFA) 108 (90 Base) MCG/ACT inhaler, Inhale 1-2 puffs into the lungs every 6 (six) hours as needed., Disp: 8 g, Rfl: 0   amphetamine-dextroamphetamine (ADDERALL) 20 MG tablet, Take 20 mg by mouth 2 (two) times daily., Disp: , Rfl:    benzonatate  (TESSALON ) 100 MG capsule, Take 1 capsule (100 mg total) by mouth 2 (two) times daily as needed for cough., Disp: 20 capsule, Rfl: 0   brompheniramine-pseudoephedrine-DM 30-2-10 MG/5ML syrup, Take 5 mLs by mouth 4 (four) times daily as needed., Disp: 120 mL, Rfl: 0   fluticasone  (FLONASE ) 50 MCG/ACT nasal spray, Place 2 sprays into both nostrils daily., Disp: 16 g, Rfl: 6   venlafaxine  XR (EFFEXOR -XR) 37.5 MG 24 hr capsule, Take 37.5 mg by mouth daily., Disp: , Rfl:    Medications ordered in this encounter:  Meds ordered this encounter  Medications   doxycycline  (VIBRA -TABS) 100 MG tablet    Sig: Take 1 tablet (100 mg total) by mouth 2 (two) times daily for 7 days.    Dispense:  14 tablet    Refill:  0    Supervising Provider:   BLAISE ALEENE KIDD [[id number]]     *If you need refills on other medications  prior to your next appointment, please contact your pharmacy*  Follow-Up: Call back or seek an in-person evaluation if the symptoms worsen or if the condition fails to improve as anticipated.   Virtual Care [phone]  Other Instructions Continue Flonase , Tessalon  perles as directed from last visit Given duration of symptoms, worsening sinus pressure, suspicious for sinusitis, cough, secondary to post nasal drainage.   Get rest and adequate sleep  Drink plenty of water, broth, and other clear fluids to stay hydrated.  Use a cool-mist humidifier or take steamy showers to relieve congestion.  Elevate the head of the bed to help with post nasal drainage Sip warm liquids, gargle with salt water, use lozenges, or suck on hard candy.  Use over-the-counter medications like acetaminophen  (Tylenol ) or ibuprofen  (Advil , Motrin ) as needed for fever and pain Honey (for those over age 86) or cough drops can help alleviate cough symptoms.  Use saline nasal sprays or washes.  Over the counter mucinex, max strength ( blue and white box) to help loosen sinus congestion.   Recommended PCP follow-up in 3-5 days, sooner if new or worsening symptoms  Sinus Infection, Adult A sinus infection is soreness and swelling (inflammation) of your sinuses. Sinuses are hollow spaces in the bones around your face. They are located: Around your eyes. In the middle of your forehead. Behind your nose. In your cheekbones. Your sinuses and nasal passages are lined with a fluid  called mucus. Mucus drains out of your sinuses. Swelling can trap mucus in your sinuses. This lets germs (bacteria, virus, or fungus) grow, which leads to infection. Most of the time, this condition is caused by a virus. What are the causes? Allergies. Asthma. Germs. Things that block your nose or sinuses. Growths in the nose (nasal polyps). Chemicals or irritants in the air. A fungus. This is rare. What increases the  risk? Having a weak body defense system (immune system). Doing a lot of swimming or diving. Using nasal sprays too much. Smoking. What are the signs or symptoms? The main symptoms of this condition are pain and a feeling of pressure around the sinuses. Other symptoms include: Stuffy nose (congestion). This may make it hard to breathe through your nose. Runny nose (drainage). Soreness, swelling, and warmth in the sinuses. A cough that may get worse at night. Being unable to smell and taste. Mucus that collects in the throat or the back of the nose (postnasal drip). This may cause a sore throat or bad breath. Being very tired (fatigued). A fever. How is this diagnosed? Your symptoms. Your medical history. A physical exam. Tests to find out if your condition is short-term (acute) or long-term (chronic). Your doctor may: Check your nose for growths (polyps). Check your sinuses using a tool that has a light on one end (endoscope). Check for allergies or germs. Do imaging tests, such as an MRI or CT scan. How is this treated? Treatment for this condition depends on the cause and whether it is short-term or long-term. If caused by a virus, your symptoms should go away on their own within 10 days. You may be given medicines to relieve symptoms. They include: Medicines that shrink swollen tissue in the nose. A spray that treats swelling of the nostrils. Rinses that help get rid of thick mucus in your nose (nasal saline washes). Medicines that treat allergies (antihistamines). Over-the-counter pain relievers. If caused by bacteria, your doctor may wait to see if you will get better without treatment. You may be given antibiotic medicine if you have: A very bad infection. A weak body defense system. If caused by growths in the nose, surgery may be needed. Follow these instructions at home: Medicines Take, use, or apply over-the-counter and prescription medicines only as told by your  doctor. These may include nasal sprays. If you were prescribed an antibiotic medicine, take it as told by your doctor. Do not stop taking it even if you start to feel better. Hydrate and humidify  Drink enough water to keep your pee (urine) pale yellow. Use a cool mist humidifier to keep the humidity level in your home above 50%. Breathe in steam for 10-15 minutes, 3-4 times a day, or as told by your doctor. You can do this in the bathroom while a hot shower is running. Try not to spend time in cool or dry air. Rest Rest as much as you can. Sleep with your head raised (elevated). Make sure you get enough sleep each night. General instructions  Put a warm, moist washcloth on your face 3-4 times a day, or as often as told by your doctor. Use nasal saline washes as often as told by your doctor. Wash your hands often with soap and water. If you cannot use soap and water, use hand sanitizer. Do not smoke. Avoid being around people who are smoking (secondhand smoke). Keep all follow-up visits. Contact a doctor if: You have a fever. Your symptoms get worse. Your  symptoms do not get better within 10 days. Get help right away if: You have a very bad headache. You cannot stop vomiting. You have very bad pain or swelling around your face or eyes. You have trouble seeing. You feel confused. Your neck is stiff. You have trouble breathing. These symptoms may be an emergency. Get help right away. Call 911. Do not wait to see if the symptoms will go away. Do not drive yourself to the hospital. Summary A sinus infection is swelling of your sinuses. Sinuses are hollow spaces in the bones around your face. This condition is caused by tissues in your nose that become inflamed or swollen. This traps germs. These can lead to infection. If you were prescribed an antibiotic medicine, take it as told by your doctor. Do not stop taking it even if you start to feel better. Keep all follow-up visits. This  information is not intended to replace advice given to you by your health care provider. Make sure you discuss any questions you have with your health care provider. Document Revised: 05/23/2021 Document Reviewed: 05/23/2021 Elsevier Patient Education  2024 [employer]  If you have been instructed to have an in-person evaluation today at a local Urgent Care facility, please use the link below. It will take you to a list of all of our available [facility], including address, phone number and hours of operation. Please do not delay care.  [facility]  If you or a family member do not have a primary care provider, use the link below to schedule a visit and establish care. When you choose a [facility] primary care physician or advanced practice provider, you gain a long-term partner in health. Find a Primary Care Provider  Learn more about [facility]'s in-office and virtual care options: [facility] - Get Care Now

## 2024-05-25 ENCOUNTER — Telehealth

## 2024-05-25 ENCOUNTER — Telehealth: Admitting: Physician Assistant

## 2024-05-25 DIAGNOSIS — H6501 Acute serous otitis media, right ear: Secondary | ICD-10-CM

## 2024-05-25 DIAGNOSIS — L03012 Cellulitis of left finger: Secondary | ICD-10-CM

## 2024-05-25 DIAGNOSIS — B379 Candidiasis, unspecified: Secondary | ICD-10-CM | POA: Diagnosis not present

## 2024-05-25 DIAGNOSIS — T3695XA Adverse effect of unspecified systemic antibiotic, initial encounter: Secondary | ICD-10-CM

## 2024-05-25 DIAGNOSIS — H8111 Benign paroxysmal vertigo, right ear: Secondary | ICD-10-CM

## 2024-05-25 MED ORDER — FLUCONAZOLE 150 MG PO TABS: 150.0000 mg | ORAL_TABLET | Freq: Once | ORAL | 0 refills | Status: AC

## 2024-05-25 MED ORDER — PREDNISONE 20 MG PO TABS: 40.0000 mg | ORAL_TABLET | Freq: Every day | ORAL | 0 refills | Status: AC

## 2024-05-25 MED ORDER — DOXYCYCLINE HYCLATE 100 MG PO TABS: 100.0000 mg | ORAL_TABLET | Freq: Two times a day (BID) | ORAL | 0 refills | Status: DC

## 2024-05-25 MED ORDER — MECLIZINE HCL 25 MG PO TABS
25.0000 mg | ORAL_TABLET | Freq: Three times a day (TID) | ORAL | 0 refills | Status: AC | PRN
Start: 2024-05-25 — End: ?

## 2024-05-25 NOTE — Progress Notes (Signed)
 Virtual Visit Consent   Gabrielle Jackson, you are scheduled for a virtual visit with a [facility] provider today. Just as with appointments in the office, your consent must be obtained to participate. Your consent will be active for this visit and any virtual visit you may have with one of our providers in the next 365 days. If you have a MyChart account, a copy of this consent can be sent to you electronically.  As this is a virtual visit, video technology does not allow for your provider to perform a traditional examination. This may limit your provider's ability to fully assess your condition. If your provider identifies any concerns that need to be evaluated in person or the need to arrange testing (such as labs, EKG, etc.), we will make arrangements to do so. Although advances in technology are sophisticated, we cannot ensure that it will always work on either your end or our end. If the connection with a video visit is poor, the visit may have to be switched to a telephone visit. With either a video or telephone visit, we are not always able to ensure that we have a secure connection.  By engaging in this virtual visit, you consent to the provision of healthcare and authorize for your insurance to be billed (if applicable) for the services provided during this visit. Depending on your insurance coverage, you may receive a charge related to this service.  I need to obtain your verbal consent now. Are you willing to proceed with your visit today? Gabrielle Jackson has provided verbal consent on 05/25/2024 for a virtual visit (video or telephone). Delon CHRISTELLA Dickinson, PA-C  Date: 05/25/2024 10:57 AM   Virtual Visit via Video Note   I, Delon CHRISTELLA Dickinson, connected with  Gabrielle Jackson  ([id number], 1990/09/16) on 05/25/24 at 10:30 AM EST by a video-enabled telemedicine application and verified that I am speaking with the correct person using two identifiers.  Location: Patient:  Virtual Visit Location Patient: Home Provider: Virtual Visit Location Provider: Home Office   I discussed the limitations of evaluation and management by telemedicine and the availability of in person appointments. The patient expressed understanding and agreed to proceed.    History of Present Illness: Gabrielle Jackson is a 33 y.o. who identifies as a female who was assigned female at birth, and is being seen today for ear congestion, dizziness, possible infected finger.  HPI: URI  This is a new problem. The current episode started yesterday (over night; had URI symptoms for a few days last week). The problem has been gradually worsening. There has been no fever. Associated symptoms include ear pain (right), headaches, nausea, a plugged ear sensation (right) and sinus pain (now improved). Pertinent negatives include no congestion, coughing, diarrhea, rhinorrhea, sore throat or vomiting. Associated symptoms comments: Dizziness (helps with laying down on the right side), tinnitus. Treatments tried: zofran . The treatment provided no relief.    Left middle finger: tip is red and swollen. Throbbing pain. Started 3 days ago. Has had some clear drainage. Has been covering with bandage and using neosporin without relief.   Problems:  Patient Active Problem List   Diagnosis Date Noted   Cellulitis of finger of right hand 02/16/2020   MDD (major depressive disorder), single episode, in partial remission 05/12/2019   Social anxiety disorder 04/01/2019   Bereavement 03/13/2019   Depressive disorder 02/13/2019   GAD (generalized anxiety disorder) 09/05/2015   Partial hamstring tear 05/02/2015    Allergies:  Allergies  Allergen Reactions   Penicillins Rash   Medications:  Current Outpatient Medications:    doxycycline  (VIBRA -TABS) 100 MG tablet, Take 1 tablet (100 mg total) by mouth 2 (two) times daily., Disp: 20 tablet, Rfl: 0   fluconazole  (DIFLUCAN ) 150 MG tablet, Take 1 tablet (150 mg  total) by mouth once for 1 dose., Disp: 1 tablet, Rfl: 0   meclizine  (ANTIVERT ) 25 MG tablet, Take 1 tablet (25 mg total) by mouth 3 (three) times daily as needed for dizziness., Disp: 30 tablet, Rfl: 0   predniSONE  (DELTASONE ) 20 MG tablet, Take 2 tablets (40 mg total) by mouth daily with breakfast., Disp: 10 tablet, Rfl: 0   albuterol  (VENTOLIN  HFA) 108 (90 Base) MCG/ACT inhaler, Inhale 1-2 puffs into the lungs every 6 (six) hours as needed., Disp: 8 g, Rfl: 0   amphetamine-dextroamphetamine (ADDERALL) 20 MG tablet, Take 20 mg by mouth 2 (two) times daily., Disp: , Rfl:    benzonatate  (TESSALON ) 100 MG capsule, Take 1 capsule (100 mg total) by mouth 2 (two) times daily as needed for cough., Disp: 20 capsule, Rfl: 0   brompheniramine-pseudoephedrine-DM 30-2-10 MG/5ML syrup, Take 5 mLs by mouth 4 (four) times daily as needed., Disp: 120 mL, Rfl: 0   fluticasone  (FLONASE ) 50 MCG/ACT nasal spray, Place 2 sprays into both nostrils daily., Disp: 16 g, Rfl: 6   venlafaxine  XR (EFFEXOR -XR) 37.5 MG 24 hr capsule, Take 37.5 mg by mouth daily., Disp: , Rfl:   Observations/Objective: Patient is well-developed, well-nourished in no acute distress.  Resting comfortably at home.  Head is normocephalic, atraumatic.  No labored breathing.  Speech is clear and coherent with logical content.  Patient is alert and oriented at baseline.  Left middle DIP to nail is red, swollen and patient reports painful to touch  Assessment and Plan: 1. Benign paroxysmal positional vertigo of right ear (Primary) - doxycycline  (VIBRA -TABS) 100 MG tablet; Take 1 tablet (100 mg total) by mouth 2 (two) times daily.  Dispense: 20 tablet; Refill: 0 - predniSONE  (DELTASONE ) 20 MG tablet; Take 2 tablets (40 mg total) by mouth daily with breakfast.  Dispense: 10 tablet; Refill: 0 - meclizine  (ANTIVERT ) 25 MG tablet; Take 1 tablet (25 mg total) by mouth 3 (three) times daily as needed for dizziness.  Dispense: 30 tablet; Refill: 0  2.  Non-recurrent acute serous otitis media of right ear - doxycycline  (VIBRA -TABS) 100 MG tablet; Take 1 tablet (100 mg total) by mouth 2 (two) times daily.  Dispense: 20 tablet; Refill: 0 - predniSONE  (DELTASONE ) 20 MG tablet; Take 2 tablets (40 mg total) by mouth daily with breakfast.  Dispense: 10 tablet; Refill: 0  3. Antibiotic-induced yeast infection - fluconazole  (DIFLUCAN ) 150 MG tablet; Take 1 tablet (150 mg total) by mouth once for 1 dose.  Dispense: 1 tablet; Refill: 0  4. Paronychia of finger of left hand - doxycycline  (VIBRA -TABS) 100 MG tablet; Take 1 tablet (100 mg total) by mouth 2 (two) times daily.  Dispense: 20 tablet; Refill: 0  - Worsening symptoms that have not responded to OTC medications.  - Will give Doxycycline  for the ear symptoms and for the infected finger - Added Meclizine  for dizziness - Added Prednisone  for eustachian tube dysfunction  - Continue saline nasal rinses - Could consider to add Flonase  (Fluticasone ) nasal spray over the counter for possible eustachian tube dysfunction - Steam and humidifier can help - Warm compress to ear - Stay well hydrated and get plenty of rest.  - Seek in  person evaluation if no symptom improvement or if symptoms worsen   Follow Up Instructions: I discussed the assessment and treatment plan with the patient. The patient was provided an opportunity to ask questions and all were answered. The patient agreed with the plan and demonstrated an understanding of the instructions.  A copy of instructions were sent to the patient via MyChart unless otherwise noted below.    The patient was advised to call back or seek an in-person evaluation if the symptoms worsen or if the condition fails to improve as anticipated.    Delon CHRISTELLA Dickinson, PA-C

## 2024-05-25 NOTE — Patient Instructions (Signed)
 Gabrielle Jackson, thank you for joining Gabrielle CHRISTELLA Dickinson, PA-C for today's virtual visit.  While this provider is not your primary care provider (PCP), if your PCP is located in our provider database this encounter information will be shared with them immediately following your visit.   A [facility] MyChart account gives you access to today's visit and all your visits, tests, and labs performed at [facility]  click here if you don't have a [facility] MyChart account or go to [email or web address]  Consent: (Patient) Gabrielle Jackson provided verbal consent for this virtual visit at the beginning of the encounter.  Current Medications:  Current Outpatient Medications:    doxycycline  (VIBRA -TABS) 100 MG tablet, Take 1 tablet (100 mg total) by mouth 2 (two) times daily., Disp: 20 tablet, Rfl: 0   fluconazole  (DIFLUCAN ) 150 MG tablet, Take 1 tablet (150 mg total) by mouth once for 1 dose., Disp: 1 tablet, Rfl: 0   meclizine  (ANTIVERT ) 25 MG tablet, Take 1 tablet (25 mg total) by mouth 3 (three) times daily as needed for dizziness., Disp: 30 tablet, Rfl: 0   predniSONE  (DELTASONE ) 20 MG tablet, Take 2 tablets (40 mg total) by mouth daily with breakfast., Disp: 10 tablet, Rfl: 0   albuterol  (VENTOLIN  HFA) 108 (90 Base) MCG/ACT inhaler, Inhale 1-2 puffs into the lungs every 6 (six) hours as needed., Disp: 8 g, Rfl: 0   amphetamine-dextroamphetamine (ADDERALL) 20 MG tablet, Take 20 mg by mouth 2 (two) times daily., Disp: , Rfl:    benzonatate  (TESSALON ) 100 MG capsule, Take 1 capsule (100 mg total) by mouth 2 (two) times daily as needed for cough., Disp: 20 capsule, Rfl: 0   brompheniramine-pseudoephedrine-DM 30-2-10 MG/5ML syrup, Take 5 mLs by mouth 4 (four) times daily as needed., Disp: 120 mL, Rfl: 0   fluticasone  (FLONASE ) 50 MCG/ACT nasal spray, Place 2 sprays into both nostrils daily., Disp: 16 g, Rfl: 6   venlafaxine  XR (EFFEXOR -XR) 37.5 MG 24 hr capsule, Take 37.5 mg  by mouth daily., Disp: , Rfl:    Medications ordered in this encounter:  Meds ordered this encounter  Medications   doxycycline  (VIBRA -TABS) 100 MG tablet    Sig: Take 1 tablet (100 mg total) by mouth 2 (two) times daily.    Dispense:  20 tablet    Refill:  0    Supervising Provider:   LAMPTEY, PHILIP O [[id number]]   predniSONE  (DELTASONE ) 20 MG tablet    Sig: Take 2 tablets (40 mg total) by mouth daily with breakfast.    Dispense:  10 tablet    Refill:  0    Supervising Provider:   LAMPTEY, PHILIP O [[id number]]   meclizine  (ANTIVERT ) 25 MG tablet    Sig: Take 1 tablet (25 mg total) by mouth 3 (three) times daily as needed for dizziness.    Dispense:  30 tablet    Refill:  0    Supervising Provider:   LAMPTEY, PHILIP O [[id number]]   fluconazole  (DIFLUCAN ) 150 MG tablet    Sig: Take 1 tablet (150 mg total) by mouth once for 1 dose.    Dispense:  1 tablet    Refill:  0    Supervising Provider:   LAMPTEY, PHILIP O [[id number]]     *If you need refills on other medications prior to your next appointment, please contact your pharmacy*  Follow-Up: Call back or seek an in-person evaluation if the symptoms worsen or if the condition fails to improve as anticipated.  [facility] [phone]  Other Instructions  Benign Positional Vertigo Vertigo is the feeling that you or your surroundings are moving when they are not. Benign positional vertigo is the most common form of vertigo. This is usually a harmless condition (benign). This condition is positional. This means that symptoms are triggered by certain movements and positions. This condition can be dangerous if it occurs while you are doing something that could cause harm to yourself or others. This includes activities such as driving or operating machinery. What are the causes? The inner ear has fluid-filled canals that help your brain sense movement and balance. When the fluid moves, the brain receives messages about your  body's position. With benign positional vertigo, calcium crystals in the inner ear break free and disturb the inner ear area. This causes your brain to receive confusing messages about your body's position. What increases the risk? You are more likely to develop this condition if: You are a woman. You are 2 years of age or older. You have recently had a head injury. You have an inner ear disease. What are the signs or symptoms? Symptoms of this condition usually happen when you move your head or your eyes in different directions. Symptoms may start suddenly and usually last for less than a minute. They include: Loss of balance and falling. Feeling like you are spinning or moving. Feeling like your surroundings are spinning or moving. Nausea and vomiting. Blurred vision. Dizziness. Involuntary eye movement (nystagmus). Symptoms can be mild and cause only minor problems, or they can be severe and interfere with daily life. Episodes of benign positional vertigo may return (recur) over time. Symptoms may also improve over time. How is this diagnosed? This condition may be diagnosed based on: Your medical history. A physical exam of the head, neck, and ears. Positional tests to check for or stimulate vertigo. You may be asked to turn your head and change positions, such as going from sitting to lying down. A health care provider will watch for symptoms of vertigo. You may be referred to a health care provider who specializes in ear, nose, and throat problems (ENT or otolaryngologist) or a provider who specializes in disorders of the nervous system (neurologist). How is this treated?  This condition may be treated in a session in which your health care provider moves your head in specific positions to help the displaced crystals in your inner ear move. Treatment for this condition may take several sessions. Surgery may be needed in severe cases, but this is rare. In some cases, benign  positional vertigo may resolve on its own in 2-4 weeks. Follow these instructions at home: Safety Move slowly. Avoid sudden body or head movements or certain positions, as told by your health care provider. Avoid driving or operating machinery until your health care provider says it is safe. Avoid doing any tasks that would be dangerous to you or others if vertigo occurs. If you have trouble walking or keeping your balance, try using a cane for stability. If you feel dizzy or unstable, sit down right away. Return to your normal activities as told by your health care provider. Ask your health care provider what activities are safe for you. General instructions Take over-the-counter and prescription medicines only as told by your health care provider. Drink enough fluid to keep your urine pale yellow. Keep all follow-up visits. This is important. Contact a health care provider if: You have a fever. Your condition gets worse or you develop  new symptoms. Your family or friends notice any behavioral changes. You have nausea or vomiting that gets worse. You have numbness or a prickling and tingling sensation. Get help right away if you: Have difficulty speaking or moving. Are always dizzy or faint. Develop severe headaches. Have weakness in your legs or arms. Have changes in your hearing or vision. Develop a stiff neck. Develop sensitivity to light. These symptoms may represent a serious problem that is an emergency. Do not wait to see if the symptoms will go away. Get medical help right away. Call your local emergency services (911 in the [place] Do not drive yourself to the hospital. Summary Vertigo is the feeling that you or your surroundings are moving when they are not. Benign positional vertigo is the most common form of vertigo. This condition is caused by calcium crystals in the inner ear that become displaced. This causes a disturbance in an area of the inner ear that helps your brain  sense movement and balance. Symptoms include loss of balance and falling, feeling that you or your surroundings are moving, nausea and vomiting, and blurred vision. This condition can be diagnosed based on symptoms, a physical exam, and positional tests. Follow safety instructions as told by your health care provider and keep all follow-up visits. This is important. This information is not intended to replace advice given to you by your health care provider. Make sure you discuss any questions you have with your health care provider. Document Revised: 01/07/2023 Document Reviewed: 01/07/2023 Elsevier Patient Education  2024 Elsevier Inc.   Otitis Media, Adult  Otitis media occurs when there is inflammation and fluid in the middle ear with signs and symptoms of an acute infection. The middle ear is a part of the ear that contains bones for hearing as well as air that helps send sounds to the brain. When infected fluid builds up in this space, it causes pressure and can lead to an ear infection. The eustachian tube connects the middle ear to the back of the nose (nasopharynx) and normally allows air into the middle ear. If the eustachian tube becomes blocked, fluid can build up and become infected. What are the causes? This condition is caused by a blockage in the eustachian tube. This can be caused by mucus or by swelling of the tube. Problems that can cause a blockage include: A cold or other upper respiratory infection. Allergies. An irritant, such as tobacco smoke. Enlarged adenoids. The adenoids are areas of soft tissue located high in the back of the throat, behind the nose and the roof of the mouth. They are part of the body's defense system (immune system). A mass in the nasopharynx. Damage to the ear caused by pressure changes (barotrauma). What increases the risk? You are more likely to develop this condition if you: Smoke or are exposed to tobacco smoke. Have an opening in the roof  of your mouth (cleft palate). Have gastroesophageal reflux. Have an immune system disorder. What are the signs or symptoms? Symptoms of this condition include: Ear pain. Fever. Decreased hearing. Tiredness (lethargy). Fluid leaking from the ear, if the eardrum is ruptured or has burst. Ringing in the ear. How is this diagnosed?  This condition is diagnosed with a physical exam. During the exam, your health care provider will use an instrument called an otoscope to look in your ear and check for redness, swelling, and fluid. He or she will also ask about your symptoms. Your health care provider may also order  tests, such as: A pneumatic otoscopy. This is a test to check the movement of the eardrum. It is done by squeezing a small amount of air into the ear. A tympanogram. This is a test that shows how well the eardrum moves in response to air pressure in the ear canal. It provides a graph for your health care provider to review. How is this treated? This condition can go away on its own within 3-5 days. But if the condition is caused by a bacterial infection and does not go away on its own, or if it keeps coming back, your health care provider may: Prescribe antibiotic medicine to treat the infection. Prescribe or recommend medicines to control pain. Follow these instructions at home: Take over-the-counter and prescription medicines only as told by your health care provider. If you were prescribed an antibiotic medicine, take it as told by your health care provider. Do not stop taking the antibiotic even if you start to feel better. Keep all follow-up visits. This is important. Contact a health care provider if: You have bleeding from your nose. There is a lump on your neck. You are not feeling better in 5 days. You feel worse instead of better. Get help right away if: You have severe pain that is not controlled with medicine. You have swelling, redness, or pain around your ear. You  have stiffness in your neck. A part of your face is not moving (paralyzed). The bone behind your ear (mastoid bone) is tender when you touch it. You develop a severe headache. Summary Otitis media is redness, soreness, and swelling of the middle ear, usually resulting in pain and decreased hearing. This condition can go away on its own within 3-5 days. If the problem does not go away in 3-5 days, your health care provider may give you medicines to treat the infection. If you were prescribed an antibiotic medicine, take it as told by your health care provider. Follow all instructions that were given to you by your health care provider. This information is not intended to replace advice given to you by your health care provider. Make sure you discuss any questions you have with your health care provider. Document Revised: 09/26/2020 Document Reviewed: 09/26/2020 Elsevier Patient Education  2024 Elsevier Inc.  Paronychia Paronychia is an infection of the skin that surrounds a nail. It usually affects the skin around a fingernail, but it may also occur near a toenail. It often causes pain and swelling around the nail. In some cases, a collection of pus (abscess) can form near or under the nail.  This condition may develop suddenly, or it may develop gradually over a longer period. In most cases, paronychia is not serious, and it will clear up with treatment. What are the causes? This condition may be caused by bacteria or a fungus, such as yeast. The bacteria or fungus can enter the body through an opening in the skin, such as a cut or a hangnail, and cause an infection in your fingernail or toenail. Other causes may include: Recurrent injury to the fingernail or toenail area. Irritation of the base and sides of the nail (cuticle). Injury and irritation can result in inflammation, swelling, and thickened skin around the nail. What increases the risk? This condition is more likely to develop in  people who: Get their hands wet often, such as those who work as fish farm manager, bartenders, or housekeepers. Bite their fingernails or cuticles. Have underlying skin conditions. Have hangnails or injured fingertips. Are exposed  to irritants like detergents and other chemicals. Have diabetes. What are the signs or symptoms? Symptoms of this condition include: Redness and swelling of the skin near the nail. Tenderness around the nail when you touch the area. Pus-filled bumps under the cuticle. Fluid or pus under the nail. Throbbing pain in the area. How is this diagnosed? This condition is diagnosed with a physical exam. In some cases, a sample of pus may be tested to determine what type of bacteria or fungus is causing the condition. How is this treated? Treatment depends on the cause and severity of your condition. If your condition is mild, it may clear up on its own in a few days or after soaking in warm water. If needed, treatment may include: Antibiotic medicine, if your infection is caused by bacteria. Antifungal medicine, if your infection is caused by a fungus. A procedure to drain pus from an abscess. Anti-inflammatory medicine (corticosteroids). Removal of part of an ingrown toenail. A bandage (dressing) may be placed over the affected area if an abscess or part of a nail has been removed. Follow these instructions at home: Wound care Keep the affected area clean. Soak the affected area in warm water if told to do so by your health care provider. You may be told to do this for 20 minutes, 2-3 times a day. Keep the area dry when you are not soaking it. Do not try to drain an abscess yourself. Follow instructions from your health care provider about how to take care of the affected area. Make sure you: Wash your hands with soap and water for at least 20 seconds before and after you change your dressing. If soap and water are not available, use hand sanitizer. Change your dressing  as told by your health care provider. If you had an abscess drained, check the area every day for signs of infection. Check for: Redness, swelling, or pain. Fluid or blood. Warmth. Pus or a bad smell. Medicines  Take over-the-counter and prescription medicines only as told by your health care provider. If you were prescribed an antibiotic medicine, take it as told by your health care provider. Do not stop taking the antibiotic even if you start to feel better. General instructions Avoid contact with any skin irritants or allergens. Do not pick at the affected area. Keep all follow-up visits as told. This is important. Prevention To prevent this condition from happening again: Wear rubber gloves when washing dishes or doing other tasks that require your hands to get wet. Wear gloves if your hands might come in contact with cleaners or other chemicals. Avoid injuring your nails or fingertips. Do not bite your nails or tear hangnails. Do not cut your nails very short. Do not cut your cuticles. Use clean nail clippers or scissors when trimming nails. Contact a health care provider if: Your symptoms get worse or do not improve with treatment. You have continued or increased fluid, blood, or pus coming from the affected area. Your affected finger, toe, or joint becomes swollen or difficult to move. You have a fever or chills. There is redness spreading away from the affected area. Summary Paronychia is an infection of the skin that surrounds a nail. It often causes pain and swelling around the nail. In some cases, a collection of pus (abscess) can form near or under the nail. This condition may be caused by bacteria or a fungus. These germs can enter the body through an opening in the skin, such as a cut  or a hangnail. If your condition is mild, it may clear up on its own in a few days. If needed, treatment may include medicine or a procedure to drain pus from an abscess. To prevent this  condition from happening again, wear gloves if doing tasks that require your hands to get wet or to come in contact with chemicals. Also avoid injuring your nails or fingertips. This information is not intended to replace advice given to you by your health care provider. Make sure you discuss any questions you have with your health care provider. Document Revised: 09/19/2020 Document Reviewed: 09/19/2020 Elsevier Patient Education  2024 Elsevier Inc.   If you have been instructed to have an in-person evaluation today at a local Urgent Care facility, please use the link below. It will take you to a list of all of our available [facility], including address, phone number and hours of operation. Please do not delay care.  [facility]  If you or a family member do not have a primary care provider, use the link below to schedule a visit and establish care. When you choose a [facility] primary care physician or advanced practice provider, you gain a long-term partner in health. Find a Primary Care Provider  Learn more about [facility] in-office and virtual care options: [facility] - Get Care Now

## 2024-07-15 ENCOUNTER — Telehealth

## 2024-07-16 ENCOUNTER — Telehealth: Admitting: Family Medicine

## 2024-07-16 DIAGNOSIS — J019 Acute sinusitis, unspecified: Secondary | ICD-10-CM | POA: Diagnosis not present

## 2024-07-16 DIAGNOSIS — B9689 Other specified bacterial agents as the cause of diseases classified elsewhere: Secondary | ICD-10-CM

## 2024-07-16 MED ORDER — PROMETHAZINE-DM 6.25-15 MG/5ML PO SYRP: 5.0000 mL | ORAL_SOLUTION | Freq: Four times a day (QID) | ORAL | 0 refills | Status: AC | PRN

## 2024-07-16 MED ORDER — DOXYCYCLINE HYCLATE 100 MG PO TABS: 100.0000 mg | ORAL_TABLET | Freq: Two times a day (BID) | ORAL | 0 refills | Status: AC

## 2024-07-16 NOTE — Progress Notes (Signed)
 " Virtual Visit Consent   Gabrielle Jackson, you are scheduled for a virtual visit with a [facility] provider today. Just as with appointments in the office, your consent must be obtained to participate. Your consent will be active for this visit and any virtual visit you may have with one of our providers in the next 365 days. If you have a MyChart account, a copy of this consent can be sent to you electronically.  As this is a virtual visit, video technology does not allow for your provider to perform a traditional examination. This may limit your provider's ability to fully assess your condition. If your provider identifies any concerns that need to be evaluated in person or the need to arrange testing (such as labs, EKG, etc.), we will make arrangements to do so. Although advances in technology are sophisticated, we cannot ensure that it will always work on either your end or our end. If the connection with a video visit is poor, the visit may have to be switched to a telephone visit. With either a video or telephone visit, we are not always able to ensure that we have a secure connection.  By engaging in this virtual visit, you consent to the provision of healthcare and authorize for your insurance to be billed (if applicable) for the services provided during this visit. Depending on your insurance coverage, you may receive a charge related to this service.  I need to obtain your verbal consent now. Are you willing to proceed with your visit today? ADAISHA CAMPISE has provided verbal consent on 07/16/2024 for a virtual visit (video or telephone). Loa Lamp, FNP  Date: 07/16/2024 4:54 PM   Virtual Visit via Video Note   I, Loa Lamp, connected with  SHAINDEL Jackson  ([id number], Apr 26, 1991) on 07/16/24 at  4:45 PM EST by a video-enabled telemedicine application and verified that I am speaking with the correct person using two identifiers.  Location: Patient: Virtual Visit Location  Patient: Home Provider: Virtual Visit Location Provider: Home Office   I discussed the limitations of evaluation and management by telemedicine and the availability of in person appointments. The patient expressed understanding and agreed to proceed.    History of Present Illness: Gabrielle Jackson is a 34 y.o. who identifies as a female who was assigned female at birth, and is being seen today for flu last week with continued yellow mucus ,sinus pressure and pain. No fever. Cough with green and yellow. Using inhaler.  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  Resting comfortably  at home.  Head is normocephalic, atraumatic.  No labored breathing.  Speech is clear and coherent with logical content.  Patient is alert and oriented at baseline.    Assessment and Plan: Sinusitis Increase fluids, humidifier at night, ibuprofen  as directed, UC as needed   Follow Up Instructions: I discussed the assessment and treatment plan with the patient. The patient was provided an opportunity to ask questions and all were answered. The patient agreed with the plan and demonstrated an understanding of the  instructions.  A copy of instructions were sent to the patient via MyChart unless otherwise noted below.     The patient was advised to call back or seek an in-person evaluation if the symptoms worsen or if the condition fails to improve as anticipated.    Shaneisha Burkel, FNP     [1]  Allergies Allergen  Reactions   Penicillins Rash  [2]  Current Outpatient Medications:    promethazine -dextromethorphan (PROMETHAZINE -DM) 6.25-15 MG/5ML syrup, Take 5 mLs by mouth 4 (four) times daily as needed for cough., Disp: 118 mL, Rfl: 0   albuterol  (VENTOLIN  HFA) 108 (90 Base) MCG/ACT inhaler, Inhale 1-2 puffs into the lungs every 6 (six) hours as needed., Disp: 8 g, Rfl: 0   amphetamine-dextroamphetamine (ADDERALL) 20 MG tablet, Take 20 mg by mouth 2 (two) times daily., Disp: , Rfl:    benzonatate  (TESSALON ) 100 MG capsule, Take 1 capsule (100 mg total) by mouth 2 (two) times daily as needed for cough., Disp: 20 capsule, Rfl: 0   brompheniramine-pseudoephedrine-DM 30-2-10 MG/5ML syrup, Take 5 mLs by mouth 4 (four) times daily as needed., Disp: 120 mL, Rfl: 0   doxycycline  (VIBRA -TABS) 100 MG tablet, Take 1 tablet (100 mg total) by mouth 2 (two) times daily., Disp: 20 tablet, Rfl: 0   fluticasone  (FLONASE ) 50 MCG/ACT nasal spray, Place 2 sprays into both nostrils daily., Disp: 16 g, Rfl: 6   meclizine  (ANTIVERT ) 25 MG tablet, Take 1 tablet (25 mg total) by mouth 3 (three) times daily as needed for dizziness., Disp: 30 tablet, Rfl: 0   predniSONE  (DELTASONE ) 20 MG tablet, Take 2 tablets (40 mg total) by mouth daily with breakfast., Disp: 10 tablet, Rfl: 0   venlafaxine  XR (EFFEXOR -XR) 37.5 MG 24 hr capsule, Take 37.5 mg by mouth daily., Disp: , Rfl:   "

## 2024-07-16 NOTE — Patient Instructions (Signed)
